# Patient Record
Sex: Female | Born: 1945 | Race: Black or African American | Hispanic: No | Smoking: Former smoker
Health system: Southern US, Community
[De-identification: ages and names within clinical notes are randomized; demographics above are authoritative.]

## PROBLEM LIST (undated history)

## (undated) DIAGNOSIS — M4800 Spinal stenosis, site unspecified: Secondary | ICD-10-CM

## (undated) DIAGNOSIS — K219 Gastro-esophageal reflux disease without esophagitis: Secondary | ICD-10-CM

## (undated) DIAGNOSIS — F419 Anxiety disorder, unspecified: Secondary | ICD-10-CM

## (undated) DIAGNOSIS — M25519 Pain in unspecified shoulder: Secondary | ICD-10-CM

## (undated) DIAGNOSIS — M1990 Unspecified osteoarthritis, unspecified site: Secondary | ICD-10-CM

## (undated) DIAGNOSIS — R609 Edema, unspecified: Secondary | ICD-10-CM

## (undated) DIAGNOSIS — M549 Dorsalgia, unspecified: Secondary | ICD-10-CM

## (undated) DIAGNOSIS — R600 Localized edema: Secondary | ICD-10-CM

## (undated) DIAGNOSIS — G4730 Sleep apnea, unspecified: Secondary | ICD-10-CM

## (undated) DIAGNOSIS — I10 Essential (primary) hypertension: Secondary | ICD-10-CM

## (undated) DIAGNOSIS — E785 Hyperlipidemia, unspecified: Secondary | ICD-10-CM

## (undated) DIAGNOSIS — M25569 Pain in unspecified knee: Secondary | ICD-10-CM

## (undated) DIAGNOSIS — G8929 Other chronic pain: Secondary | ICD-10-CM

## (undated) DIAGNOSIS — I447 Left bundle-branch block, unspecified: Secondary | ICD-10-CM

## (undated) DIAGNOSIS — R7303 Prediabetes: Secondary | ICD-10-CM

## (undated) DIAGNOSIS — E559 Vitamin D deficiency, unspecified: Secondary | ICD-10-CM

## (undated) DIAGNOSIS — Z91018 Allergy to other foods: Secondary | ICD-10-CM

## (undated) HISTORY — DX: Spinal stenosis, site unspecified: M48.00

## (undated) HISTORY — DX: Anxiety disorder, unspecified: F41.9

## (undated) HISTORY — PX: OOPHORECTOMY: SHX86

## (undated) HISTORY — PX: ABDOMINAL HYSTERECTOMY: SHX81

## (undated) HISTORY — DX: Allergy to other foods: Z91.018

## (undated) HISTORY — DX: Vitamin D deficiency, unspecified: E55.9

## (undated) HISTORY — PX: TOTAL HIP ARTHROPLASTY: SHX124

## (undated) HISTORY — DX: Left bundle-branch block, unspecified: I44.7

## (undated) HISTORY — DX: Dorsalgia, unspecified: M54.9

## (undated) HISTORY — DX: Pain in unspecified shoulder: M25.519

## (undated) HISTORY — DX: Gastro-esophageal reflux disease without esophagitis: K21.9

## (undated) HISTORY — DX: Hyperlipidemia, unspecified: E78.5

## (undated) HISTORY — PX: COLONOSCOPY: SHX174

## (undated) HISTORY — PX: APPENDECTOMY: SHX54

## (undated) HISTORY — DX: Other chronic pain: G89.29

## (undated) HISTORY — DX: Unspecified osteoarthritis, unspecified site: M19.90

## (undated) HISTORY — DX: Prediabetes: R73.03

## (undated) HISTORY — DX: Pain in unspecified knee: M25.569

## (undated) HISTORY — DX: Edema, unspecified: R60.9

## (undated) HISTORY — DX: Localized edema: R60.0

---

## 1968-10-11 HISTORY — PX: ABDOMINAL HYSTERECTOMY: SHX81

## 1968-10-11 HISTORY — PX: APPENDECTOMY: SHX54

## 1998-04-30 ENCOUNTER — Ambulatory Visit (HOSPITAL_COMMUNITY): Admission: RE | Admit: 1998-04-30 | Discharge: 1998-04-30 | Payer: Self-pay | Admitting: Internal Medicine

## 1998-08-08 ENCOUNTER — Ambulatory Visit (HOSPITAL_COMMUNITY): Admission: RE | Admit: 1998-08-08 | Discharge: 1998-08-08 | Payer: Self-pay | Admitting: Internal Medicine

## 1998-08-10 ENCOUNTER — Ambulatory Visit (HOSPITAL_COMMUNITY): Admission: RE | Admit: 1998-08-10 | Discharge: 1998-08-10 | Payer: Self-pay | Admitting: Internal Medicine

## 2000-06-06 ENCOUNTER — Encounter: Payer: Self-pay | Admitting: Emergency Medicine

## 2000-06-06 ENCOUNTER — Emergency Department (HOSPITAL_COMMUNITY): Admission: EM | Admit: 2000-06-06 | Discharge: 2000-06-07 | Payer: Self-pay | Admitting: Emergency Medicine

## 2000-10-08 ENCOUNTER — Encounter: Admission: RE | Admit: 2000-10-08 | Discharge: 2000-10-08 | Payer: Self-pay | Admitting: Internal Medicine

## 2000-10-08 ENCOUNTER — Encounter: Payer: Self-pay | Admitting: Internal Medicine

## 2001-05-04 ENCOUNTER — Encounter: Payer: Self-pay | Admitting: Internal Medicine

## 2001-05-04 ENCOUNTER — Encounter: Admission: RE | Admit: 2001-05-04 | Discharge: 2001-05-04 | Payer: Self-pay | Admitting: Internal Medicine

## 2002-10-14 ENCOUNTER — Encounter: Admission: RE | Admit: 2002-10-14 | Discharge: 2002-10-14 | Payer: Self-pay | Admitting: Internal Medicine

## 2002-10-14 ENCOUNTER — Encounter: Payer: Self-pay | Admitting: Internal Medicine

## 2002-10-28 ENCOUNTER — Observation Stay (HOSPITAL_COMMUNITY): Admission: EM | Admit: 2002-10-28 | Discharge: 2002-10-29 | Payer: Self-pay

## 2002-10-28 ENCOUNTER — Encounter: Payer: Self-pay | Admitting: *Deleted

## 2008-02-24 ENCOUNTER — Encounter: Admission: RE | Admit: 2008-02-24 | Discharge: 2008-02-24 | Payer: Self-pay | Admitting: Specialist

## 2010-06-28 NOTE — H&P (Signed)
[name], [name]                          ACCOUNT NO.:  [id number]   MEDICAL RECORD NO.:  [id number]                   PATIENT TYPE:  EMS   LOCATION:  MAJO                                 FACILITY:  [facility]   PHYSICIAN:  [name], M.D.               DATE OF BIRTH:  Dec 01, 1945   DATE OF ADMISSION:  10/28/2002  DATE OF DISCHARGE:                                HISTORY & PHYSICAL   CHIEF COMPLAINT:  Chest tightness.   HISTORY OF PRESENT ILLNESS:  This 65 year old black female has a history of  multiple cardiac risk factors.  She presents with the onset, late last p.m.  of chest tightness in the retrosternal area, nonradiating.  This was  associated with nausea, diaphoresis, dizziness, and shortness of breath and  a feeling that her heart was beating quickly.  She called 911 and the EMTs  arrived and when given nitroglycerin, oxygen, and aspirin her pain  eventually resolved.  EKG at the scene showed normal sinus rhythm and no  acute changes.  The patient did have an episode of similar chest tightness  about 2 weeks ago while going to work.  She has also had intermittent  palpitations over the last 5 months and sometimes feels like her heart is  racing.  In 1 episode her heart rate was found to be 170 on a monitor at the  dialysis center.  She had a Holter monitor done 2 weeks ago by Dr. Lovell French,  the results of which are pending.  The patient has multiple risk factors for  coronary disease including hypertension, hyperlipidemia (not taking Lipitor  as prescribed).   PAST MEDICAL HISTORY:  1. Hypertension.  2. Hyperlipidemia (Lipitor prescribed but not taking).   PAST SURGICAL HISTORY:  BTL, TAH, BSO for fibroids.   ALLERGIES:  No known drug allergies.   CURRENT MEDICATIONS:  1. Lisinopril HCTZ 20/12.5 mg q. _____.  2. Lipitor, not taking.   FAMILY HISTORY:  Early CAD and tobacco use. Mother MI age 34, CABG age 64,  died age 42 of renal failure, had diabetes  mellitus.  Father died age 44 of  stroke and hypertension.  Brother MI age 5, brother hypertension.   SOCIAL HISTORY:  Divorced. She has 3 children.  She is a Research scientist (physical sciences).  Alcohol none.  Tobacco use: 1 pack per day.   REVIEW OF SYSTEMS:  Otherwise negative.   PHYSICAL EXAMINATION:  GENERAL:  An obese, uncomfortable appearing black  female.  VITAL SIGNS:  Temperature 97.5, blood pressure 104/51, heart rate 79,  respirations 20.  Oxygen saturation is 98% on room air.  HEENT: Pupils are equal and respond to light.  Extraocular movements are  intact.  Funduscopic is limited.  Ears TMs clear.  Oropharynx is clear.  NECK:  Supple. No lymphadenopathy.  No carotid bruits.  No thyromegaly.  LUNGS:  Clear.  HEART:  Regular rate and  rhythm without murmur, gallop, or rub.  ABDOMEN:  Soft, nontender, normal bowel sounds.  No masses or  hepatosplenomegaly.  EXTREMITIES:  Showed no edema.  NEUROLOGIC: Nonfocal.   LABS:  CK/MB 1.5/1.2/1.2. Troponin I negative, less than 0.5 x3.  Other lab  work pending.  Chest x-ray pending.  EKG normal sinus rhythm and nonspecific  T wave changes.   ASSESSMENT:  1. Chest tightness, rule out unstable angina or subacute myocardial     infarction.  2. Hypertension.  3. Hyperlipidemia.  4. Tobacco use.  5. Family history of premature coronary artery disease.   PLAN:  1. Admit to telemetry.  2. Aspirin.  3. Lovenox.  4. Cardiac consult for probable stress test.  5. Continue outpatient antihypertensive.                                                [name], M.D.    [name]  D:  10/28/2002  T:  10/28/2002  Job:  [id number]   cc:   [name], M.D.  [address], [address]  [place]  [place]  [address]  Fax: [phone]

## 2010-06-28 NOTE — Discharge Summary (Signed)
NAMEALEXUS, [name]                          ACCOUNT NO.:  [id number]   MEDICAL RECORD NO.:  [id number]                   PATIENT TYPE:  INP   LOCATION:  2019                                 FACILITY:  [facility]   PHYSICIAN:  Marcene Duos, M.D.         DATE OF BIRTH:  1945/11/05   DATE OF ADMISSION:  10/28/2002  DATE OF DISCHARGE:  10/29/2002                                 DISCHARGE SUMMARY   PRIMARY CARE PHYSICIAN:  Lilla Shook, M.D.   ADMISSION DIAGNOSES:  1. Atypical chest pain.  2. Hypertension.  3. Hyperlipidemia.  4. Tobacco abuse.   DISCHARGE DIAGNOSES:  .  Atypical chest pain.  1. Hypertension.  2. Hyperlipidemia.  3. Tobacco abuse.   CONSULTATIONS:  Francisca December, M.D., cardiology.   PROCEDURES:  Stress Cardiolite 10/28/2002, normal.   HISTORY OF PRESENT ILLNESS AND HOSPITAL COURSE:  [name] is a 65 year old  female who was admitted to the emergency room on the morning of 10/28/2002,  by Dr. Kirby Funk with the complaint of chest tightness at rest the  evening before, associated with nausea, shortness of breath, dizziness,  diaphoresis, and palpitations.  The patient was given oxygen, nitroglycerin,  aspirin by EMT with relief of pain.  EKG at the scene showed no acute  changes.  The patient was subsequently brought to the emergency room.  The  patient apparently gave a history of palpitations over the last five months.  Was seen two weeks prior to admission by nurse practitioner at [facility],  and a Holter monitor was obtained.  Apparently her heart rate was noted to  be up to 170 at dialysis center recently.  She had not received Holter  monitor results.   The patient was admitted to telemetry.  Aspirin was started.  Lovenox was  initiated as well, and cardiology consult was obtained.  Hospital course is  as follows.   CHEST PAIN:  The patient remained asymptomatic throughout her  hospitalization.  Serial cardiac isoenzymes and D-dimer  were obtained.  The  D-dimer was within normal limits.  Cardiac enzymes without evidence of  injury.  She did undergo a Cardiolite stress on 10/28/2002, following  cardiology consult.  Dr. Amil Amen reviewed her images as well as the EKG  stress with no changes to suggest ischemia with either portion of the test,  and it was recommended that she could be discharged yesterday evening.   With regards to palpitations, the patient has remained in sinus bradycardia  or normal sinus rhythm since her admission per telemetry.  When asked on day  of discharge, the patient has had some heartburn that has been bothering her  in recent weeks.  She has taken Prilosec for this in the past which has  helped.  She also admits to having increased stressors in her life, both  personally and professionally, and wonders if this can be adding to this as  well.  Decision  was made to start her on Prilosec 20 mg daily at discharge  and to follow up with Dr. Lovell Sheehan in one week to discuss if additional  medications should be initiated for possible anxiety panic attacks.   The patient is discharged in stable condition on 10/29/2002.   DISCHARGE MEDICATIONS:  1. Prilosec 20 mg daily.  2. Lisinopril/hydrochlorothiazide 20/12.5 mg once daily.  3. Encouraged to take medications for lipid lowering as she has not been     doing so.  She will discuss that with Dr. Lovell Sheehan and follow up in one     week.                                                Marcene Duos, M.D.    EMM/MEDQ  D:  10/29/2002  T:  10/30/2002  Job:  [id number]   cc:   Lilla Shook, M.D.  [address], [address]  [place]  [place]  [address]  Fax: [phone]

## 2010-06-28 NOTE — Discharge Summary (Signed)
   [name], [name]                          ACCOUNT NO.:  [id number]   MEDICAL RECORD NO.:  [id number]                   PATIENT TYPE:  INP   LOCATION:  2019                                 FACILITY:  [facility]   PHYSICIAN:  Marcene Duos, M.D.         DATE OF BIRTH:  [birth year]   DATE OF ADMISSION:  10/28/2002  DATE OF DISCHARGE:  10/29/2002                                 DISCHARGE SUMMARY   ADMISSION DIAGNOSES:  1. Chest pain.  2. Palpations.  3. Hyperlipidemia.  4. Tobacco abuse.   DISCHARGE DIAGNOSES:  1. Chest pain, noncardiac.  2. Palpations.  3. Hyperlipidemia.  4. Tobacco abuse.   HISTORY OF PRESENT ILLNESS:  This is a 65 year old black female patient of  Dr. Mayra Neer who was admitted to the emergency room on the morning of  October 28, 2002 by Dr. Kirby Funk with a history of chest tightness,  center of chest, associated with nausea, shortness of breath, dizziness, and  diaphoresis, and palpitations.  The patient was given oxygen, nitroglycerin,  and aspirin by EMT with relief of pain.  EKG at the scene showed no acute  changes.   Dictation was cut off at this point.                                                    Marcene Duos, M.D.    EMM/MEDQ  D:  10/29/2002  T:  10/30/2002  Job:  [id number]   cc:   Lilla Shook, M.D.  [address]  [place]  [place]  [address]  Fax: [phone]

## 2011-03-13 ENCOUNTER — Other Ambulatory Visit (HOSPITAL_COMMUNITY): Payer: Self-pay | Admitting: Orthopaedic Surgery

## 2011-03-26 ENCOUNTER — Encounter (HOSPITAL_COMMUNITY): Payer: Self-pay | Admitting: Pharmacy Technician

## 2011-03-31 ENCOUNTER — Other Ambulatory Visit (HOSPITAL_COMMUNITY): Payer: Self-pay

## 2011-03-31 ENCOUNTER — Ambulatory Visit (HOSPITAL_COMMUNITY)
Admission: RE | Admit: 2011-03-31 | Discharge: 2011-03-31 | Disposition: A | Discharge status: Home / Self Care | Payer: Medicare Other | Source: Ambulatory Visit | Attending: Orthopaedic Surgery | Admitting: Orthopaedic Surgery

## 2011-03-31 ENCOUNTER — Encounter (HOSPITAL_COMMUNITY)
Admission: RE | Admit: 2011-03-31 | Discharge: 2011-03-31 | Disposition: A | Discharge status: Home / Self Care | Payer: Medicare Other | Source: Ambulatory Visit | Attending: Orthopaedic Surgery | Admitting: Orthopaedic Surgery

## 2011-03-31 ENCOUNTER — Encounter (HOSPITAL_COMMUNITY): Payer: Self-pay

## 2011-03-31 DIAGNOSIS — Z01812 Encounter for preprocedural laboratory examination: Secondary | ICD-10-CM | POA: Insufficient documentation

## 2011-03-31 DIAGNOSIS — Z01818 Encounter for other preprocedural examination: Secondary | ICD-10-CM | POA: Insufficient documentation

## 2011-03-31 DIAGNOSIS — M1610 Unilateral primary osteoarthritis, unspecified hip: Secondary | ICD-10-CM | POA: Insufficient documentation

## 2011-03-31 DIAGNOSIS — I10 Essential (primary) hypertension: Secondary | ICD-10-CM | POA: Insufficient documentation

## 2011-03-31 DIAGNOSIS — M169 Osteoarthritis of hip, unspecified: Secondary | ICD-10-CM | POA: Insufficient documentation

## 2011-03-31 HISTORY — DX: Unspecified osteoarthritis, unspecified site: M19.90

## 2011-03-31 HISTORY — DX: Essential (primary) hypertension: I10

## 2011-03-31 LAB — CBC
Hemoglobin: 13.8 g/dL (ref 12.0–15.0)
MCH: 31.1 pg (ref 26.0–34.0)
MCHC: 33.7 g/dL (ref 30.0–36.0)
MCV: 92.3 fL (ref 78.0–100.0)
Platelets: 347 10*3/uL (ref 150–400)
RBC: 4.44 MIL/uL (ref 3.87–5.11)

## 2011-03-31 LAB — URINALYSIS, ROUTINE W REFLEX MICROSCOPIC
Bilirubin Urine: NEGATIVE
Glucose, UA: NEGATIVE mg/dL
Ketones, ur: NEGATIVE mg/dL
Leukocytes, UA: NEGATIVE
Protein, ur: NEGATIVE mg/dL
pH: 7 (ref 5.0–8.0)

## 2011-03-31 LAB — BASIC METABOLIC PANEL
BUN: 11 mg/dL (ref 6–23)
CO2: 27 mEq/L (ref 19–32)
Calcium: 10.2 mg/dL (ref 8.4–10.5)
GFR calc non Af Amer: 56 mL/min — ABNORMAL LOW (ref >90)
Glucose, Bld: 101 mg/dL — ABNORMAL HIGH (ref 70–99)

## 2011-03-31 NOTE — Patient Instructions (Signed)
20 [name]  03/31/2011   Your procedure is scheduled on:  0730am on Friday 04/04/2011  Report to [facility] at 0530 AM.  Call this number if you have problems the morning of surgery: [phone]   Remember:   Do not eat food:After Midnight.  May have clear liquids:until Midnight .  Clear liquids include soda, tea, black coffee, apple or grape juice, broth.  Take these medicines the morning of surgery with A SIP OF WATER: Bystolic   Do not wear jewelry, make-up or nail polish.  Do not wear lotions, powders, or perfumes.   Do not shave 48 hours prior to surgery.(women only-shaving legs)  Do not bring valuables to the hospital.  Contacts, dentures or bridgework may not be worn into surgery.  Leave suitcase in the car. After surgery it may be brought to your room.  For patients admitted to the hospital, checkout time is 11:00 AM the day of discharge.   Patients discharged the day of surgery will not be allowed to drive home.  Name and phone number of your driver:   Special Instructions: CHG Shower Use Special Wash: 1/2 bottle night before surgery and 1/2 bottle morning of surgery.   Please read over the following fact sheets that you were given: MRSA Information

## 2011-03-31 NOTE — Pre-Procedure Instructions (Signed)
[name], x-ray tech with Dr. Doneen Poisson notified with abnormal BMET.

## 2011-03-31 NOTE — Pre-Procedure Instructions (Signed)
EKG 11/04/2010 by [facility] group on chart, Echo from [facility] -Dr, Verdis Prime 11/11/2010, Office note [facility] 01/15/2011 and 11/04/2010 on chart.

## 2011-04-04 ENCOUNTER — Inpatient Hospital Stay (HOSPITAL_COMMUNITY): Payer: Medicare Other | Admitting: Registered Nurse

## 2011-04-04 ENCOUNTER — Encounter (HOSPITAL_COMMUNITY): Payer: Self-pay | Admitting: *Deleted

## 2011-04-04 ENCOUNTER — Inpatient Hospital Stay (HOSPITAL_COMMUNITY): Payer: Medicare Other

## 2011-04-04 ENCOUNTER — Encounter (HOSPITAL_COMMUNITY): Payer: Self-pay | Admitting: Registered Nurse

## 2011-04-04 ENCOUNTER — Inpatient Hospital Stay (HOSPITAL_COMMUNITY)
Admission: RE | Admit: 2011-04-04 | Discharge: 2011-04-08 | DRG: 470 | Disposition: A | Discharge status: Home Health | Payer: Medicare Other | Source: Ambulatory Visit | Attending: Orthopaedic Surgery | Admitting: Orthopaedic Surgery

## 2011-04-04 ENCOUNTER — Encounter (HOSPITAL_COMMUNITY)
Admission: RE | Disposition: A | Discharge status: Home Health | Payer: Self-pay | Source: Ambulatory Visit | Attending: Orthopaedic Surgery

## 2011-04-04 DIAGNOSIS — M169 Osteoarthritis of hip, unspecified: Secondary | ICD-10-CM

## 2011-04-04 DIAGNOSIS — I10 Essential (primary) hypertension: Secondary | ICD-10-CM | POA: Diagnosis present

## 2011-04-04 DIAGNOSIS — M1610 Unilateral primary osteoarthritis, unspecified hip: Principal | ICD-10-CM | POA: Diagnosis present

## 2011-04-04 HISTORY — PX: TOTAL HIP ARTHROPLASTY: SHX124

## 2011-04-04 LAB — ABO/RH: ABO/RH(D): A POS

## 2011-04-04 LAB — TYPE AND SCREEN

## 2011-04-04 SURGERY — ARTHROPLASTY, HIP, TOTAL, ANTERIOR APPROACH
Anesthesia: Spinal | Site: Hip | Laterality: Right | Wound class: Clean

## 2011-04-04 MED ORDER — METHOCARBAMOL 500 MG PO TABS
500.0000 mg | ORAL_TABLET | Freq: Four times a day (QID) | ORAL | Status: DC | PRN
  Administered 2011-04-06: 500 mg via ORAL
  Filled 2011-04-04 (×2): qty 1

## 2011-04-04 MED ORDER — HYDROMORPHONE HCL PF 1 MG/ML IJ SOLN: 0.2500 mg | INTRAMUSCULAR | Status: DC | PRN

## 2011-04-04 MED ORDER — ACETAMINOPHEN 10 MG/ML IV SOLN
INTRAVENOUS | Status: DC | PRN
  Administered 2011-04-04: 1000 mg via INTRAVENOUS

## 2011-04-04 MED ORDER — MORPHINE SULFATE 2 MG/ML IJ SOLN: 1.0000 mg | INTRAMUSCULAR | Status: DC | PRN

## 2011-04-04 MED ORDER — FUROSEMIDE 20 MG PO TABS
20.0000 mg | ORAL_TABLET | Freq: Two times a day (BID) | ORAL | Status: DC
  Administered 2011-04-04 – 2011-04-08 (×8): 20 mg via ORAL
  Filled 2011-04-04 (×9): qty 1

## 2011-04-04 MED ORDER — PROPOFOL 10 MG/ML IV EMUL
INTRAVENOUS | Status: DC | PRN
  Administered 2011-04-04: 75 ug/kg/min via INTRAVENOUS

## 2011-04-04 MED ORDER — DOCUSATE SODIUM 100 MG PO CAPS
100.0000 mg | ORAL_CAPSULE | Freq: Two times a day (BID) | ORAL | Status: DC
  Administered 2011-04-04 – 2011-04-08 (×8): 100 mg via ORAL
  Filled 2011-04-04 (×9): qty 1

## 2011-04-04 MED ORDER — ONDANSETRON HCL 4 MG/2ML IJ SOLN: 4.0000 mg | Freq: Four times a day (QID) | INTRAMUSCULAR | Status: DC | PRN

## 2011-04-04 MED ORDER — MENTHOL 3 MG MT LOZG
1.0000 | LOZENGE | OROMUCOSAL | Status: DC | PRN
  Filled 2011-04-04: qty 9

## 2011-04-04 MED ORDER — PHENYLEPHRINE HCL 10 MG/ML IJ SOLN
INTRAMUSCULAR | Status: DC | PRN
  Administered 2011-04-04 (×5): 80 ug via INTRAVENOUS

## 2011-04-04 MED ORDER — DIPHENHYDRAMINE HCL 50 MG/ML IJ SOLN: 12.5000 mg | Freq: Four times a day (QID) | INTRAMUSCULAR | Status: DC | PRN

## 2011-04-04 MED ORDER — MIDAZOLAM HCL 5 MG/5ML IJ SOLN
INTRAMUSCULAR | Status: DC | PRN
  Administered 2011-04-04: 2 mg via INTRAVENOUS

## 2011-04-04 MED ORDER — ACETAMINOPHEN 650 MG RE SUPP: 650.0000 mg | Freq: Four times a day (QID) | RECTAL | Status: DC | PRN

## 2011-04-04 MED ORDER — PROMETHAZINE HCL 25 MG/ML IJ SOLN: 6.2500 mg | INTRAMUSCULAR | Status: DC | PRN

## 2011-04-04 MED ORDER — LACTATED RINGERS IV SOLN: INTRAVENOUS | Status: DC

## 2011-04-04 MED ORDER — ALUM & MAG HYDROXIDE-SIMETH 200-200-20 MG/5ML PO SUSP: 30.0000 mL | ORAL | Status: DC | PRN

## 2011-04-04 MED ORDER — ZOLPIDEM TARTRATE 5 MG PO TABS: 5.0000 mg | ORAL_TABLET | Freq: Every evening | ORAL | Status: DC | PRN

## 2011-04-04 MED ORDER — MORPHINE SULFATE (PF) 1 MG/ML IV SOLN
INTRAVENOUS | Status: DC
  Administered 2011-04-04: 11:00:00 via INTRAVENOUS
  Administered 2011-04-04: 2 mg via INTRAVENOUS
  Administered 2011-04-04: 1 mg via INTRAVENOUS
  Administered 2011-04-04: 23:00:00 via INTRAVENOUS
  Administered 2011-04-04: 15 mg via INTRAVENOUS
  Administered 2011-04-04: 7 mg via INTRAVENOUS
  Administered 2011-04-05: 8.91 mg via INTRAVENOUS
  Administered 2011-04-05: 2 mg via INTRAVENOUS
  Administered 2011-04-05: 8 mg via INTRAVENOUS
  Administered 2011-04-05: 2.98 mg via INTRAVENOUS
  Administered 2011-04-05: 4 mg via INTRAVENOUS
  Administered 2011-04-05: 6 mg via INTRAVENOUS
  Administered 2011-04-06: 2 mg via INTRAVENOUS
  Administered 2011-04-06: 9 mg via INTRAVENOUS
  Administered 2011-04-06: 8 mg via INTRAVENOUS
  Filled 2011-04-04 (×2): qty 25

## 2011-04-04 MED ORDER — LISINOPRIL 40 MG PO TABS
40.0000 mg | ORAL_TABLET | Freq: Every day | ORAL | Status: DC
  Administered 2011-04-06 – 2011-04-08 (×3): 40 mg via ORAL
  Filled 2011-04-04 (×4): qty 1

## 2011-04-04 MED ORDER — METOCLOPRAMIDE HCL 10 MG PO TABS: 5.0000 mg | ORAL_TABLET | Freq: Three times a day (TID) | ORAL | Status: DC | PRN

## 2011-04-04 MED ORDER — 0.9 % SODIUM CHLORIDE (POUR BTL) OPTIME
TOPICAL | Status: DC | PRN
  Administered 2011-04-04: 1000 mL

## 2011-04-04 MED ORDER — MEPERIDINE HCL 50 MG/ML IJ SOLN: 6.2500 mg | INTRAMUSCULAR | Status: DC | PRN

## 2011-04-04 MED ORDER — SODIUM CHLORIDE 0.9 % IV SOLN
INTRAVENOUS | Status: DC
  Administered 2011-04-04 (×2): via INTRAVENOUS
  Administered 2011-04-05: 1000 mL via INTRAVENOUS
  Administered 2011-04-05: 02:00:00 via INTRAVENOUS
  Administered 2011-04-06: 1000 mL via INTRAVENOUS

## 2011-04-04 MED ORDER — LACTATED RINGERS IV SOLN
INTRAVENOUS | Status: DC | PRN
  Administered 2011-04-04 (×2): via INTRAVENOUS

## 2011-04-04 MED ORDER — DIPHENHYDRAMINE HCL 12.5 MG/5ML PO ELIX: 12.5000 mg | ORAL_SOLUTION | ORAL | Status: DC | PRN

## 2011-04-04 MED ORDER — PHENOL 1.4 % MT LIQD: 1.0000 | OROMUCOSAL | Status: DC | PRN

## 2011-04-04 MED ORDER — HYDROCODONE-ACETAMINOPHEN 5-325 MG PO TABS: 1.0000 | ORAL_TABLET | ORAL | Status: DC | PRN

## 2011-04-04 MED ORDER — BUPIVACAINE HCL (PF) 0.5 % IJ SOLN
INTRAMUSCULAR | Status: DC | PRN
  Administered 2011-04-04: 3 mL

## 2011-04-04 MED ORDER — CEFAZOLIN SODIUM 1-5 GM-% IV SOLN
1.0000 g | Freq: Three times a day (TID) | INTRAVENOUS | Status: DC
  Administered 2011-04-04 – 2011-04-06 (×5): 1 g via INTRAVENOUS
  Filled 2011-04-04 (×8): qty 50

## 2011-04-04 MED ORDER — FERROUS SULFATE 325 (65 FE) MG PO TABS
325.0000 mg | ORAL_TABLET | Freq: Three times a day (TID) | ORAL | Status: DC
  Administered 2011-04-04 – 2011-04-08 (×10): 325 mg via ORAL
  Filled 2011-04-04 (×13): qty 1

## 2011-04-04 MED ORDER — METOCLOPRAMIDE HCL 5 MG/ML IJ SOLN: 5.0000 mg | Freq: Three times a day (TID) | INTRAMUSCULAR | Status: DC | PRN

## 2011-04-04 MED ORDER — EPHEDRINE SULFATE 50 MG/ML IJ SOLN
INTRAMUSCULAR | Status: DC | PRN
  Administered 2011-04-04 (×2): 5 mg via INTRAVENOUS

## 2011-04-04 MED ORDER — METHOCARBAMOL 100 MG/ML IJ SOLN
500.0000 mg | Freq: Four times a day (QID) | INTRAMUSCULAR | Status: DC | PRN
  Administered 2011-04-04 (×2): 500 mg via INTRAVENOUS
  Filled 2011-04-04 (×3): qty 5

## 2011-04-04 MED ORDER — RIVAROXABAN 10 MG PO TABS
10.0000 mg | ORAL_TABLET | Freq: Every day | ORAL | Status: DC
  Administered 2011-04-05 – 2011-04-08 (×4): 10 mg via ORAL
  Filled 2011-04-04 (×4): qty 1

## 2011-04-04 MED ORDER — ACETAMINOPHEN 325 MG PO TABS: 650.0000 mg | ORAL_TABLET | Freq: Four times a day (QID) | ORAL | Status: DC | PRN

## 2011-04-04 MED ORDER — CEFAZOLIN SODIUM-DEXTROSE 2-3 GM-% IV SOLR
2.0000 g | INTRAVENOUS | Status: AC
  Administered 2011-04-04: 2 g via INTRAVENOUS

## 2011-04-04 MED ORDER — SODIUM CHLORIDE 0.9 % IJ SOLN: 9.0000 mL | INTRAMUSCULAR | Status: DC | PRN

## 2011-04-04 MED ORDER — NALOXONE HCL 0.4 MG/ML IJ SOLN: 0.4000 mg | INTRAMUSCULAR | Status: DC | PRN

## 2011-04-04 MED ORDER — FENTANYL CITRATE 0.05 MG/ML IJ SOLN
INTRAMUSCULAR | Status: DC | PRN
  Administered 2011-04-04: 100 ug via INTRAVENOUS

## 2011-04-04 MED ORDER — ONDANSETRON HCL 4 MG PO TABS: 4.0000 mg | ORAL_TABLET | Freq: Four times a day (QID) | ORAL | Status: DC | PRN

## 2011-04-04 MED ORDER — PHENYLEPHRINE HCL 10 MG/ML IJ SOLN
10.0000 mg | INTRAVENOUS | Status: DC | PRN
  Administered 2011-04-04: 50 ug/min via INTRAVENOUS

## 2011-04-04 MED ORDER — HETASTARCH-ELECTROLYTES 6 % IV SOLN
INTRAVENOUS | Status: DC | PRN
  Administered 2011-04-04: 08:00:00 via INTRAVENOUS

## 2011-04-04 MED ORDER — DIPHENHYDRAMINE HCL 12.5 MG/5ML PO ELIX
12.5000 mg | ORAL_SOLUTION | Freq: Four times a day (QID) | ORAL | Status: DC | PRN
  Filled 2011-04-04: qty 5

## 2011-04-04 MED ORDER — NEBIVOLOL HCL 10 MG PO TABS
10.0000 mg | ORAL_TABLET | Freq: Every day | ORAL | Status: DC
  Administered 2011-04-06 – 2011-04-08 (×3): 10 mg via ORAL
  Filled 2011-04-04 (×4): qty 1

## 2011-04-04 MED ORDER — OXYCODONE HCL 5 MG PO TABS
5.0000 mg | ORAL_TABLET | ORAL | Status: DC | PRN
  Administered 2011-04-04 – 2011-04-05 (×3): 5 mg via ORAL
  Administered 2011-04-06 – 2011-04-07 (×4): 10 mg via ORAL
  Administered 2011-04-07 (×2): 5 mg via ORAL
  Administered 2011-04-07: 10 mg via ORAL
  Administered 2011-04-07: 5 mg via ORAL
  Administered 2011-04-08 (×2): 10 mg via ORAL
  Filled 2011-04-04: qty 1
  Filled 2011-04-04 (×2): qty 2
  Filled 2011-04-04 (×3): qty 1
  Filled 2011-04-04: qty 2
  Filled 2011-04-04: qty 1
  Filled 2011-04-04 (×5): qty 2

## 2011-04-04 SURGICAL SUPPLY — 36 items
BAG SPEC THK2 15X12 ZIP CLS (MISCELLANEOUS) ×1
BAG ZIPLOCK 12X15 (MISCELLANEOUS) ×3 IMPLANT
BLADE SAW SGTL 18X1.27X75 (BLADE) ×2 IMPLANT
CELLS DAT CNTRL 66122 CELL SVR (MISCELLANEOUS) ×1 IMPLANT
CLOTH BEACON ORANGE TIMEOUT ST (SAFETY) ×2 IMPLANT
DRAPE C-ARM 42X72 X-RAY (DRAPES) ×2 IMPLANT
DRAPE STERI IOBAN 125X83 (DRAPES) ×2 IMPLANT
DRAPE U-SHAPE 47X51 STRL (DRAPES) ×6 IMPLANT
DRSG MEPILEX BORDER 4X8 (GAUZE/BANDAGES/DRESSINGS) ×2 IMPLANT
DURAPREP 26ML APPLICATOR (WOUND CARE) ×2 IMPLANT
ELECT BLADE TIP CTD 4 INCH (ELECTRODE) ×2 IMPLANT
ELECT REM PT RETURN 9FT ADLT (ELECTROSURGICAL) ×2
ELECTRODE REM PT RTRN 9FT ADLT (ELECTROSURGICAL) ×1 IMPLANT
FACESHIELD LNG OPTICON STERILE (SAFETY) ×8 IMPLANT
GAUZE XEROFORM 1X8 LF (GAUZE/BANDAGES/DRESSINGS) ×2 IMPLANT
GLOVE BIO SURGEON STRL SZ7 (GLOVE) ×1 IMPLANT
GLOVE BIO SURGEON STRL SZ7.5 (GLOVE) ×2 IMPLANT
GLOVE BIOGEL PI IND STRL 7.5 (GLOVE) IMPLANT
GLOVE BIOGEL PI IND STRL 8 (GLOVE) ×1 IMPLANT
GLOVE BIOGEL PI INDICATOR 7.5 (GLOVE)
GLOVE BIOGEL PI INDICATOR 8 (GLOVE) ×1
GLOVE ECLIPSE 7.0 STRL STRAW (GLOVE) ×1 IMPLANT
GOWN STRL REIN XL XLG (GOWN DISPOSABLE) ×4 IMPLANT
KIT BASIN OR (CUSTOM PROCEDURE TRAY) ×2 IMPLANT
PACK TOTAL JOINT (CUSTOM PROCEDURE TRAY) ×2 IMPLANT
PADDING CAST COTTON 6X4 STRL (CAST SUPPLIES) ×2 IMPLANT
RETRACTOR WND ALEXIS 18 MED (MISCELLANEOUS) ×1 IMPLANT
RTRCTR WOUND ALEXIS 18CM MED (MISCELLANEOUS) ×2
STAPLER SKIN PROX WIDE 3.9 (STAPLE) ×1 IMPLANT
SUT ETHIBOND NAB CT1 #1 30IN (SUTURE) ×3 IMPLANT
SUT VIC AB 1 CT1 36 (SUTURE) ×3 IMPLANT
SUT VIC AB 2-0 CT1 27 (SUTURE) ×4
SUT VIC AB 2-0 CT1 TAPERPNT 27 (SUTURE) ×2 IMPLANT
TOWEL OR 17X26 10 PK STRL BLUE (TOWEL DISPOSABLE) ×4 IMPLANT
TOWEL OR NON WOVEN STRL DISP B (DISPOSABLE) ×2 IMPLANT
TRAY FOLEY CATH 14FRSI W/METER (CATHETERS) ×2 IMPLANT

## 2011-04-04 NOTE — Transfer of Care (Signed)
Immediate Anesthesia Transfer of Care Note  Patient: [name]  Procedure(s) Performed: Procedure(s) (LRB): TOTAL HIP ARTHROPLASTY ANTERIOR APPROACH (Right)  Patient Location: PACU  Anesthesia Type: Regional  Level of Consciousness: awake, alert , oriented, patient cooperative and responds to stimulation  Airway & Oxygen Therapy: Patient Spontanous Breathing and Patient connected to face mask oxygen  Post-op Assessment: Report given to PACU RN and Post -op Vital signs reviewed and stable  Post vital signs: stable  Complications: No apparent anesthesia complications

## 2011-04-04 NOTE — Anesthesia Procedure Notes (Addendum)
Spinal  Patient location during procedure: OR Start time: 04/04/2011 7:35 AM End time: 04/04/2011 7:45 AM Staffing Anesthesiologist: Phillips Grout Performed by: anesthesiologist  Preanesthetic Checklist Completed: patient identified, site marked, surgical consent, pre-op evaluation, timeout performed, IV checked, risks and benefits discussed and monitors and equipment checked Spinal Block Patient position: sitting Prep: Betadine Patient monitoring: heart rate, continuous pulse ox and blood pressure Approach: midline Location: L2-3 Injection technique: single-shot Needle Needle type: Spinocan  Needle gauge: 22 G Needle length: 9 cm Additional Notes Expiration date of kit checked and confirmed. Patient tolerated procedure well, without complications.

## 2011-04-04 NOTE — Progress Notes (Signed)
Utilization review completed.  

## 2011-04-04 NOTE — Anesthesia Preprocedure Evaluation (Addendum)
Anesthesia Evaluation  Patient identified by MRN, date of birth, ID band Patient awake    Reviewed: Allergy & Precautions, H&P , NPO status , Patient's Chart, lab work & pertinent test results, reviewed documented beta blocker date and time   Airway Mallampati: II TM Distance: >3 FB Neck ROM: Full    Dental No notable dental hx.    Pulmonary neg pulmonary ROS,  clear to auscultation  Pulmonary exam normal       Cardiovascular hypertension, Pt. on medications neg cardio ROS Regular Normal    Neuro/Psych Negative Neurological ROS  Negative Psych ROS   GI/Hepatic negative GI ROS, Neg liver ROS,   Endo/Other  Negative Endocrine ROS  Renal/GU negative Renal ROS  Genitourinary negative   Musculoskeletal negative musculoskeletal ROS (+)   Abdominal   Peds negative pediatric ROS (+)  Hematology negative hematology ROS (+)   Anesthesia Other Findings Upper front caps  Reproductive/Obstetrics negative OB ROS                          Anesthesia Physical Anesthesia Plan  ASA: II  Anesthesia Plan: Spinal   Post-op Pain Management:    Induction:   Airway Management Planned:   Additional Equipment:   Intra-op Plan:   Post-operative Plan:   Informed Consent: I have reviewed the patients History and Physical, chart, labs and discussed the procedure including the risks, benefits and alternatives for the proposed anesthesia with the patient or authorized representative who has indicated his/her understanding and acceptance.   Dental advisory given  Plan Discussed with: CRNA  Anesthesia Plan Comments:        Anesthesia Quick Evaluation

## 2011-04-04 NOTE — Brief Op Note (Signed)
04/04/2011  9:44 AM  PATIENT:  [name]  66 y.o. female  PRE-OPERATIVE DIAGNOSIS:  Severe end-stage arthritis right hip  POST-OPERATIVE DIAGNOSIS:  Severe end-stage arthritis right hip  PROCEDURE:  Procedure(s) (LRB): TOTAL HIP ARTHROPLASTY ANTERIOR APPROACH (Right)  SURGEON:  Surgeon(s) and Role:    * [name], MD - Primary  PHYSICIAN ASSISTANT:   ASSISTANTS: none   ANESTHESIA:   spinal  EBL:  Total I/O In: 1500 [I.V.:1000; IV Piggyback:500] Out: 700 [Urine:150; Blood:550]  BLOOD ADMINISTERED:none  DRAINS: none   LOCAL MEDICATIONS USED:  NONE  SPECIMEN:  No Specimen  DISPOSITION OF SPECIMEN:  N/A  COUNTS:  YES  TOURNIQUET:  * No tourniquets in log *  DICTATION: .Other Dictation: Dictation Number [id number]  PLAN OF CARE: Admit to inpatient   PATIENT DISPOSITION:  PACU - hemodynamically stable.   Delay start of Pharmacological VTE agent (>24hrs) due to surgical blood loss or risk of bleeding: yes

## 2011-04-04 NOTE — Preoperative (Signed)
Beta Blockers   Reason not to administer Beta Blockers:took betablocker this am at 5:00am

## 2011-04-04 NOTE — Anesthesia Postprocedure Evaluation (Signed)
  Anesthesia Post-op Note  Patient: [name]  Procedure(s) Performed: Procedure(s) (LRB): TOTAL HIP ARTHROPLASTY ANTERIOR APPROACH (Right)  Patient Location: PACU  Anesthesia Type: Spinal  Level of Consciousness: awake and alert   Airway and Oxygen Therapy: Patient Spontanous Breathing  Post-op Pain: mild  Post-op Assessment: Post-op Vital signs reviewed, Patient's Cardiovascular Status Stable, Respiratory Function Stable, Patent Airway and No signs of Nausea or vomiting  Post-op Vital Signs: stable  Complications: No apparent anesthesia complications

## 2011-04-04 NOTE — H&P (Signed)
[name] is an 66 y.o. female.   Chief Complaint:   Severe right hip pain HPI:   66 yo female with end-stage OA of right hip with x-rays showing bone-on-bone wear.  Wishes to proceed with a right total hip replacement.  Understands the risks of acute blood loss, fracture, DVT, PE.  The benefits are decreased pain and improved mobility as well as improved quality of life.  Past Medical History  Diagnosis Date  . Hypertension   . Arthritis     Past Surgical History  Procedure Date  . Appendectomy 1970's    with hysterectomy  . Abdominal hysterectomy 1970's    History reviewed. No pertinent family history. Social History:  reports that she quit smoking about 10 months ago. She does not have any smokeless tobacco history on file. She reports that she drinks alcohol. She reports that she does not use illicit drugs.  Allergies:  Allergies  Allergen Reactions  . Kiwi Extract Itching  . Pineapple Itching    Medications Prior to Admission  Medication Dose Route Frequency Provider Last Rate Last Dose  . ceFAZolin (ANCEF) IVPB 2 g/50 mL premix  2 g Intravenous 60 min Pre-Op [name], MD       No current outpatient prescriptions on file as of 04/04/2011.    Results for orders placed during the hospital encounter of 04/04/11 (from the past 48 hour(s))  ABO/RH     Status: Normal (Preliminary result)   Collection Time   04/04/11  6:00 AM      Component Value Range Comment   ABO/RH(D) A POS     TYPE AND SCREEN     Status: Normal (Preliminary result)   Collection Time   04/04/11  6:10 AM      Component Value Range Comment   ABO/RH(D) A POS      Antibody Screen PENDING      Sample Expiration 04/07/2011      No results found.  Review of Systems  All other systems reviewed and are negative.    Blood pressure 152/79, pulse 63, temperature 99.5 F (37.5 C), temperature source Oral, resp. rate 18, SpO2 96.00%. Physical Exam  Constitutional: She is oriented to  person, place, and time. She appears well-developed and well-nourished.  HENT:  Head: Normocephalic and atraumatic.  Eyes: EOM are normal. Pupils are equal, round, and reactive to light.  Neck: Normal range of motion. Neck supple.  Cardiovascular: Normal rate and regular rhythm.   Respiratory: Effort normal and breath sounds normal.  GI: Soft. Bowel sounds are normal.  Musculoskeletal:       Right hip: She exhibits decreased range of motion, decreased strength and bony tenderness.  Neurological: She is alert and oriented to person, place, and time.  Skin: Skin is warm and dry.  Psychiatric: She has a normal mood and affect.     Assessment/Plan To the OR today for a right total hip replacement and admission as an inpatient.  [name] 04/04/2011, 6:57 AM

## 2011-04-05 LAB — BASIC METABOLIC PANEL
BUN: 7 mg/dL (ref 6–23)
Chloride: 101 mEq/L (ref 96–112)
Creatinine, Ser: 0.97 mg/dL (ref 0.50–1.10)
GFR calc Af Amer: 70 mL/min — ABNORMAL LOW (ref >90)
Glucose, Bld: 111 mg/dL — ABNORMAL HIGH (ref 70–99)

## 2011-04-05 LAB — CBC
HCT: 30.3 % — ABNORMAL LOW (ref 36.0–46.0)
MCV: 92.7 fL (ref 78.0–100.0)
RDW: 13.3 % (ref 11.5–15.5)
WBC: 8.7 10*3/uL (ref 4.0–10.5)

## 2011-04-05 NOTE — Progress Notes (Signed)
Subjective: Pt ambulating in hall   Objective: Vital signs in last 24 hours: Temp:  [97.3 F (36.3 C)-99.5 F (37.5 C)] 99.5 F (37.5 C) (02/23 0600) Pulse Rate:  [50-75] 75  (02/23 0600) Resp:  [12-20] 16  (02/23 0600) BP: (103-127)/(49-70) 103/66 mmHg (02/23 0900) SpO2:  [100 %] 100 % (02/23 0851) FiO2 (%):  [2 %] 2 % (02/22 1210) Weight:  [112.946 kg (249 lb)] 112.946 kg (249 lb) (02/22 1210)  Intake/Output from previous day: 02/22 0701 - 02/23 0700 In: 4368.7 [P.O.:300; I.V.:3358.7; IV Piggyback:710] Out: 3200 [Urine:2650; Blood:550] Intake/Output this shift:    Exam:  Neurovascular intact Sensation intact distally  Labs:  Basename 04/05/11 0425  HGB 10.3*    Basename 04/05/11 0425  WBC 8.7  RBC 3.27*  HCT 30.3*  PLT 280    Basename 04/05/11 0425  NA 134*  K 3.7  CL 101  CO2 27  BUN 7  CREATININE 0.97  GLUCOSE 111*  CALCIUM 8.1*   No results found for this basename: LABPT:2,INR:2 in the last 72 hours  Assessment/Plan: Pt doing well with PT - continue to mobilize - dc next week   [name] 04/05/2011, 10:03 AM

## 2011-04-05 NOTE — Evaluation (Signed)
Physical Therapy Evaluation Patient Details Name: [name] MRN: [id number] DOB: 11-03-1945 Today's Date: 04/05/2011  Problem List:  Patient Active Problem List  Diagnoses  . Degenerative arthritis of hip    Past Medical History:  Past Medical History  Diagnosis Date  . Hypertension   . Arthritis    Past Surgical History:  Past Surgical History  Procedure Date  . Appendectomy 1970's    with hysterectomy  . Abdominal hysterectomy 1970's    PT Assessment/Plan/Recommendation PT Assessment Clinical Impression Statement: Pt with R THR presents with limited R LE strength/ROM; decreased L knee ROM and limited functional mobility.  Pt will benefit from skilled PT intervention to maximize IND for d/c home PT Recommendation/Assessment: Patient will need skilled PT in the acute care venue PT Problem List: Decreased strength;Decreased range of motion;Decreased activity tolerance;Decreased mobility;Decreased knowledge of use of DME;Pain;Obesity PT Therapy Diagnosis : Difficulty walking PT Plan PT Frequency: 7X/week PT Treatment/Interventions: DME instruction;Gait training;Stair training;Therapeutic exercise;Therapeutic activities;Functional mobility training;Patient/family education PT Recommendation Recommendations for Other Services: OT consult Follow Up Recommendations: Home health PT Equipment Recommended: Rolling walker with 5" wheels;Other (comment) (Pt will need wide RW) PT Goals  Acute Rehab PT Goals PT Goal Formulation: With patient Time For Goal Achievement: 7 days Pt will go Supine/Side to Sit: with supervision PT Goal: Supine/Side to Sit - Progress: Goal set today Pt will go Sit to Supine/Side: with supervision PT Goal: Sit to Supine/Side - Progress: Goal set today Pt will go Sit to Stand: with supervision PT Goal: Sit to Stand - Progress: Goal set today Pt will go Stand to Sit: with supervision PT Goal: Stand to Sit - Progress: Goal set today Pt will Ambulate: 51  - 150 feet;with supervision;with rolling walker PT Goal: Ambulate - Progress: Goal set today Pt will Go Up / Down Stairs: 3-5 stairs;with least restrictive assistive device PT Goal: Up/Down Stairs - Progress: Goal set today  PT Evaluation Precautions/Restrictions  Restrictions Weight Bearing Restrictions: No Other Position/Activity Restrictions: WBAT Prior Functioning  Home Living Lives With: Spouse Receives Help From: Family Type of Home: House Home Layout: One level Home Access: Stairs to enter Entrance Stairs-Rails: Can reach both Entrance Stairs-Number of Steps: 4 Prior Function Level of Independence: Independent with basic ADLs;Independent with gait;Independent with transfers Able to Take Stairs?: Yes Cognition Cognition Arousal/Alertness: Awake/alert Overall Cognitive Status: Appears within functional limits for tasks assessed Orientation Level: Oriented X4 Sensation/Coordination Coordination Gross Motor Movements are Fluid and Coordinated: Yes Extremity Assessment RUE Assessment RUE Assessment: Within Functional Limits LUE Assessment LUE Assessment: Within Functional Limits RLE Assessment RLE Assessment: Exceptions to [facility] (hip flex to 75; hip abd 15; hip strength 2/5; quads 3/5) LLE Assessment LLE Assessment: Exceptions to [facility] (knee flex ltd to 70) Mobility (including Balance) Bed Mobility Bed Mobility: Yes Supine to Sit: 3: Mod assist Supine to Sit Details (indicate cue type and reason): cues for sequence and to self assist with UEs and L LE Transfers Transfers: Yes Sit to Stand: 3: Mod assist Sit to Stand Details (indicate cue type and reason): cues for use of UEs and for LE position Stand to Sit: 3: Mod assist Stand to Sit Details: cues for use of UEs and for LE position Ambulation/Gait Ambulation/Gait: Yes Ambulation/Gait Assistance: 3: Mod assist Ambulation/Gait Assistance Details (indicate cue type and reason): cues for posture, sequence, and position  from RW Ambulation Distance (Feet): 9 Feet Assistive device: Rolling walker Gait Pattern: Step-to pattern Gait velocity: slow    Exercise  Total Joint  Exercises Ankle Circles/Pumps: AROM;10 reps;Supine Quad Sets: AROM;10 reps;Supine;Both Heel Slides: AAROM;10 reps;Supine;Right Hip ABduction/ADduction: Right;AAROM;10 reps;Supine End of Session PT - End of Session Activity Tolerance: Patient tolerated treatment well Patient left: in chair;with call bell in reach;with family/visitor present Nurse Communication: Mobility status for transfers;Mobility status for ambulation General Behavior During Session: [facility] for tasks performed Cognition: [facility] for tasks performed  [name] 04/05/2011, 1:20 PM

## 2011-04-05 NOTE — Op Note (Signed)
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  PROCEDURE:  Right total hip arthroplasty through direct anterior approach.  IMPLANTS:  DePuy Sector with Gription acetabular component size 50, size 32+4 neutral polyethylene liner, size 13 Corail femoral component with standard offset, size 32+1 ceramic hip ball.  SURGEON:  [name], M.D.  ANESTHESIA:  Spinal with sedation.  ESTIMATED BLOOD LOSS:  A 550 cc.  ANTIBIOTICS:  A 2 g IV Ancef.  COMPLICATIONS:  None.  INDICATIONS:  [name] is a 66 year old morbidly obese female with debilitating arthritis involving her right hip.  She has x-rays that show end-stage arthritis and bone-on-bone wear.  This affected her activities of daily living and she has severe pain.  She wished to proceed with a total hip arthroplasty.  The risks and benefits of this have been explained to her in detail and she does wish to proceed with surgery.  DESCRIPTION OF PROCEDURE:  After informed consent was obtained and appropriate right hip was marked, she was brought to the operating room and spinal anesthesia was obtained while she was on a stretcher.  She was then laid back down flat and a Foley catheter was placed and her feet were placed in traction boots.  She was then placed on the HANA fracture table with perineal post in place and both legs in the inline skeletal traction and the traction devices but no traction applied.  Her right hip was then prepped and draped  with DuraPrep and sterile drapes. A time-out was called and she was identified as the correct patient and correct right hip.  I then made an incision just inferior and posterior to the anterior superior iliac spine and carried this obliquely down the leg.  I dissected down to the tensor fasciae latae and then divided the tensor fasciae obliquely to expose the tensor fasciae latae muscle and I then proceeded with a direct anterior approach to the hip.  Cobra retractor was placed around the lateral neck and then up underneath the rectus femoris, a Cobra retractor was placed.  I then coagulated the lateral femoral circumflex vessels.  I opened the hip capsule and put the retractors within the hip capsule.  I then made my femoral neck cut proximal to the lesser trochanter using an oscillating saw and finished the cut with an osteotome.  A cord screw guide was placed in the femoral head and I removed the femoral head in its entirety and it was devoid of cartilage and quite hard in terms of consisting quality of the bone.  I then placed a bent Hohmann medially and a Cobra retractor laterally to gain access to the acetabulum.  Under direct visualization, I cleaned the tissue from the base of the  acetabulum and the rim.  I then began reaming from size 42 reamer all the way up to a size 50, with a 48 and 50 reamers placed under direct fluoroscopic guidance as well as visualization.  I then chose a size 50 acetabular component with Gription and knocked this into the acetabulum under direct visualization and fluoroscopy to get correct inclination and anteversion as I could. Attention was then turned to the femur after I placed a polyethylene liner and the acetabular component.  All traction was off the leg and the right leg was externally rotated to 90 degrees, with a temporary hook underneath the femur.  The leg was extended and adducted to allow access to the femoral canal.  I then released tissue  from behind the greater trochanter and the piriformis was released.  The lateral capsule was released as well to gain access to the femoral canal.  I used the box cutting guide and a rongeur to gain access to the femoral canal and I then began broaching from a size 8 broach all the way to a size 13 broach.  Once the 13 broach was placed and it was felt to be snug, I trialed a 32+1 hip ball and reduced this into the acetabulum.  Her leg lengths were near equal and it was felt to be stable with minimal shuck and good rotation and flexion.  I then removed the trial size 13 stem and femoral hip ball and placed the real size 13 stem with a real 32+1 ceramic hip ball and reduced this again into the acetabulum and it was stable.  We copiously irrigated the deep tissues with normal saline solution.  I closed the joint capsule with #1 Ethibond suture.  I then closed the tensor fasciae latae with a running #1 Vicryl followed by 2-0 Vicryl in the subcutaneous tissue and interrupted staples on the skin. Well-padded sterile dressing was applied.  She was taken off the HANA table into the recovery room in stable condition.  All final counts were correct and there were no complications noted.     [name], M.D.     CYB/MEDQ  D:  04/04/2011  T:  04/05/2011  Job:  [id number]

## 2011-04-05 NOTE — Progress Notes (Signed)
Cm spoke with pt concerning d/c planning. Pt given choice list for [facility] services. No choice made at present time. Pt request DME. [facility] notified of DME referral. Dme delivery scheduled to room prior to discharge.    Leonie Green [phone]

## 2011-04-05 NOTE — Progress Notes (Signed)
Physical Therapy Treatment Patient Details Name: ASPIN French MRN: [id number] DOB: Oct 06, 1945 Today's Date: 04/05/2011  PT Assessment/Plan  PT - Assessment/Plan Comments on Treatment Session: Marked improvement in activity tolerance vs this am PT Plan: Discharge plan remains appropriate PT Frequency: 7X/week Recommendations for Other Services: OT consult Follow Up Recommendations: Home health PT Equipment Recommended: Rolling walker with 5" wheels;Other (comment) PT Goals  Acute Rehab PT Goals Time For Goal Achievement: 7 days Pt will go Supine/Side to Sit: with supervision PT Goal: Supine/Side to Sit - Progress: Goal set today Pt will go Sit to Supine/Side: with supervision PT Goal: Sit to Supine/Side - Progress: Goal set today Pt will go Sit to Stand: with supervision PT Goal: Sit to Stand - Progress: Goal set today Pt will go Stand to Sit: with supervision PT Goal: Stand to Sit - Progress: Goal set today Pt will Ambulate: 51 - 150 feet;with supervision;with rolling walker PT Goal: Ambulate - Progress: Goal set today Pt will Go Up / Down Stairs: 3-5 stairs;with least restrictive assistive device PT Goal: Up/Down Stairs - Progress: Goal set today  PT Treatment Precautions/Restrictions  Restrictions Weight Bearing Restrictions: No Other Position/Activity Restrictions: WBAT Mobility (including Balance) Bed Mobility Sit to Supine: 4: Min assist;3: Mod assist;HOB flat Sit to Supine - Details (indicate cue type and reason): cues for sequence and use of L LE to self assist Transfers Sit to Stand: 3: Mod assist Sit to Stand Details (indicate cue type and reason): cues for use of UEs and for LE position Stand to Sit: 4: Min assist;3: Mod assist Stand to Sit Details: cues for use of UEs and for LE position Ambulation/Gait Ambulation/Gait Assistance: 4: Min assist;3: Mod assist Ambulation/Gait Assistance Details (indicate cue type and reason): cues for posture, sequence, and  position from RW Ambulation Distance (Feet): 49 Feet Assistive device: Rolling walker Gait Pattern: Step-to pattern Gait velocity: slow    Exercise    End of Session PT - End of Session Activity Tolerance: Patient tolerated treatment well Patient left: in bed;with call bell in reach;with family/visitor present Nurse Communication: Mobility status for transfers;Mobility status for ambulation General Behavior During Session: [facility] for tasks performed Cognition: [facility] for tasks performed  [name] 04/05/2011, 2:18 PM

## 2011-04-05 NOTE — Plan of Care (Signed)
Problem: Phase I Progression Outcomes Goal: Dangle evening of surgery Outcome: Not Met (add Reason) Too sleepy to dangle on side of bed

## 2011-04-06 LAB — CBC
HCT: 28 % — ABNORMAL LOW (ref 36.0–46.0)
MCH: 31.5 pg (ref 26.0–34.0)
MCHC: 33.9 g/dL (ref 30.0–36.0)
MCV: 92.7 fL (ref 78.0–100.0)
RDW: 13.3 % (ref 11.5–15.5)

## 2011-04-06 MED ORDER — SODIUM CHLORIDE 0.9 % IV SOLN: INTRAVENOUS | Status: DC

## 2011-04-06 NOTE — Progress Notes (Signed)
Physical Therapy Treatment Patient Details Name: [name] MRN: [id number] DOB: [birth year] Today's Date: 04/06/2011  PT Assessment/Plan  PT - Assessment/Plan Comments on Treatment Session: The patient is progressing well with mobility and ther ex.  Hardest part is sit to stand.  We will try to practice steps this PM.   PT Plan: Discharge plan remains appropriate;Frequency remains appropriate PT Frequency: 7X/week Follow Up Recommendations: Home health PT Equipment Recommended: Rolling walker with 5" wheels;Other (comment) (wide RW) PT Goals  Acute Rehab PT Goals PT Goal: Sit to Stand - Progress: Progressing toward goal PT Goal: Stand to Sit - Progress: Progressing toward goal PT Goal: Ambulate - Progress: Progressing toward goal  PT Treatment Precautions/Restrictions  Restrictions Weight Bearing Restrictions: No Other Position/Activity Restrictions: WBAT Mobility (including Balance) Transfers Sit to Stand: 3: Mod assist Sit to Stand Details (indicate cue type and reason): cues for safe technique, assist to support patient's trunk for balance and to stabilize the RW.   Stand to Sit: 4: Min assist Stand to Sit Details: minguard assist for safety and to help make sure descent is slow.  Verbal cues to reach back and kick right let out for comfort.   Ambulation/Gait Ambulation/Gait: Yes Ambulation/Gait Assistance: 4: Min assist Ambulation/Gait Assistance Details (indicate cue type and reason): min guard assist, cues to not pivot while turning.   Ambulation Distance (Feet): 80 Feet Assistive device: Rolling walker Gait Pattern: Step-to pattern;Antalgic    Exercise  Total Joint Exercises Ankle Circles/Pumps: AROM;Both;10 reps Quad Sets: AROM;Right;10 reps Short Arc Quad: AROM;Right;10 reps Long Arc Quad: AROM;10 reps;Left End of Session PT - End of Session Equipment Utilized During Treatment: Gait belt (RW) Activity Tolerance: Patient tolerated treatment well Patient left:  in chair;with call bell in reach;with family/visitor present (daughter) General Behavior During Session: [facility] for tasks performed Cognition: [facility] for tasks performed  Aron Inge B. [name], PT, DPT [phone] 04/06/2011, 11:40 AM

## 2011-04-06 NOTE — Progress Notes (Signed)
Physical Therapy Treatment Patient Details Name: SURIE SUCHOCKI MRN: [id number] DOB: 15-Nov-1945 Today's Date: 04/06/2011  PT Assessment/Plan  PT - Assessment/Plan Comments on Treatment Session: The patient was able to practice stairs successfully this PM.  Her walking is much smooter.  Her biggest struggle right now is getting to standing.   PT Plan: Discharge plan remains appropriate;Frequency remains appropriate PT Frequency: 7X/week Follow Up Recommendations: Home health PT Equipment Recommended: Rolling walker with 5" wheels;Other (comment) (wide RW) PT Goals  Acute Rehab PT Goals PT Goal: Sit to Supine/Side - Progress: Progressing toward goal PT Goal: Sit to Stand - Progress: Progressing toward goal Pt will go Stand to Sit: with modified independence;without upper extremity assist PT Goal: Stand to Sit - Progress: Updated due to goals met Pt will Ambulate: >150 feet;with supervision;with rolling walker PT Goal: Ambulate - Progress: Updated due to goal met Pt will Go Up / Down Stairs: with supervision;3-5 stairs;with rail(s) PT Goal: Up/Down Stairs - Progress: Updated due to goal met  PT Treatment Precautions/Restrictions  Restrictions Weight Bearing Restrictions: No Other Position/Activity Restrictions: WBAT Mobility (including Balance) Bed Mobility Sit to Supine: 4: Min assist Sit to Supine - Details (indicate cue type and reason): min assist of right leg back into the bed.   Transfers Sit to Stand: 3: Mod assist;With upper extremity assist;From chair/3-in-1;With armrests Sit to Stand Details (indicate cue type and reason): cues for safe technique, assist to support patient's trunk for balance and to stabilize the RW.   Stand to Sit: 5: Supervision;To bed;With upper extremity assist;To elevated surface Stand to Sit Details: supervision for safety, patient demonstrated safe technique Ambulation/Gait Ambulation/Gait: Yes Ambulation/Gait Assistance: 5:  Supervision Ambulation/Gait Assistance Details (indicate cue type and reason): supervision for safety. Better gait pattern this PM, more heel to toe contact, less antalgic.   Ambulation Distance (Feet): 80 Feet Assistive device: Rolling walker Gait Pattern: Step-through pattern;Antalgic Stairs: Yes Stairs Assistance: 4: Min assist Stairs Assistance Details (indicate cue type and reason): min assist to steady trunk for balance while practicing stairs for the first time.   Stair Management Technique: Two rails;Step to pattern;Forwards Number of Stairs: 4  Height of Stairs: 6     End of Session PT - End of Session Equipment Utilized During Treatment: Gait belt (RW) Activity Tolerance: Patient tolerated treatment well Patient left: in bed;with call bell in reach;with family/visitor present (husband, nefew, grandson) General Behavior During Session: [facility] for tasks performed Cognition: [facility] for tasks performed  Lurena Joiner B. Lekeya Rollings, PT, DPT [phone] 04/06/2011, 4:28 PM

## 2011-04-06 NOTE — Progress Notes (Signed)
Subjective: 2 Days Post-Op Procedure(s) (LRB): TOTAL HIP ARTHROPLASTY ANTERIOR APPROACH (Right) Patient reports pain as mild.    Objective: Vital signs in last 24 hours: Temp:  [98.3 F (36.8 C)-100.9 F (38.3 C)] 100.9 F (38.3 C) (02/24 0609) Pulse Rate:  [59-71] 71  (02/24 0609) Resp:  [16-20] 20  (02/24 0609) BP: (94-124)/(54-74) 124/74 mmHg (02/24 0609) SpO2:  [2 %-100 %] 98 % (02/24 0751)  Intake/Output from previous day: 02/23 0701 - 02/24 0700 In: 2717.9 [P.O.:720; I.V.:1847.9; IV Piggyback:150] Out: 2400 [Urine:2400] Intake/Output this shift: Total I/O In: 360 [P.O.:360] Out: 600 [Urine:600]   Basename 04/06/11 0430 04/05/11 0425  HGB 9.5* 10.3*    Basename 04/06/11 0430 04/05/11 0425  WBC 10.1 8.7  RBC 3.02* 3.27*  HCT 28.0* 30.3*  PLT 250 280    Basename 04/05/11 0425  NA 134*  K 3.7  CL 101  CO2 27  BUN 7  CREATININE 0.97  GLUCOSE 111*  CALCIUM 8.1*   No results found for this basename: LABPT:2,INR:2 in the last 72 hours  Neurovascular intact Sensation intact distally Intact pulses distally Dorsiflexion/Plantar flexion intact Incision: scant drainage  Assessment/Plan: 2 Days Post-Op Procedure(s) (LRB): TOTAL HIP ARTHROPLASTY ANTERIOR APPROACH (Right) Plan for discharge tomorrow  [name] 04/06/2011, 8:31 AM

## 2011-04-07 LAB — CBC
MCH: 31.4 pg (ref 26.0–34.0)
MCHC: 34.4 g/dL (ref 30.0–36.0)
Platelets: 264 10*3/uL (ref 150–400)
RBC: 2.99 MIL/uL — ABNORMAL LOW (ref 3.87–5.11)
RDW: 13.3 % (ref 11.5–15.5)

## 2011-04-07 NOTE — Clinical Documentation Improvement (Signed)
BMI DOCUMENTATION CLARIFICATION QUERY  THIS DOCUMENT IS NOT A PERMANENT PART OF THE MEDICAL RECORD  TO RESPOND TO THE THIS QUERY, FOLLOW THE INSTRUCTIONS BELOW:  1. If needed, update documentation for the patient's encounter via the notes activity.  2. Access this query again and click edit on the In [employer].  3. After updating, or not, click F2 to complete all highlighted (required) fields concerning your review. Select "additional documentation in the medical record" OR "no additional documentation provided".  4. Click Sign note button.  5. The deficiency will fall out of your In Basket *Please let us know if you are not able to complete this workflow by phone or e-mail (listed below).         04/07/11  Dear Dr. Magnus Ivan, [name]  In an effort to better capture your patient's severity of illness, reflect appropriate length of stay and utilization of resources, a review of the patient medical record has revealed the following indicators.    Based on your clinical judgment, please clarify and document in a progress note and/or discharge summary the clinical condition associated with the following supporting information:  In responding to this query please exercise your independent judgment.  The fact that a query is asked, does not imply that any particular answer is desired or expected.  Pt's BMI=  40.3 in setting of Svere end-stage arthritis right hip    Please clarify whether or not BMI can be linked to one of he diagnoses listed below and document in pn  and d/c. Thank You!  [facility]: When linking BMI to a diagnosis please document both BMI and diagnosis together in pn for accuracy of SOI and ROM.   Possible Clinical conditions  Morbid Obesity W/ BMI=   Underweight w/BMI=  Other condition___________________  Cannot Clinically determine _____________  Risk Factors: Sign & Symptoms: Svere end-stage arthritis right hip  HTN,  Arthritis  BMI-40.3 5'6"/249lbs   Treatment monitoring   Reviewed:  no additional documentation provided  Thank You,  Enis Slipper  RN, BSN, CCDS Clinical Documentation Specialist [facility] HIM Dept Pager: [phone] / E-mail: [email or web address]   Health Information Management [place]

## 2011-04-07 NOTE — Progress Notes (Signed)
Physical Therapy Treatment Patient Details Name: NGOC DETJEN MRN: [id number] DOB: [birth year] Today's Date: 04/07/2011  R THR Direct Anterior   WBAT POD #3 11:25 - 11:50 1 gt  1 te  PT Assessment/Plan  PT - Assessment/Plan Comments on Treatment Session: Pt staying one more day. Pt plans to D/C to home. PT Plan: Discharge plan remains appropriate Follow Up Recommendations: Home health PT PT Goals  Acute Rehab PT Goals PT Goal Formulation: With patient Pt will go Supine/Side to Sit: with supervision PT Goal: Supine/Side to Sit - Progress: Progressing toward goal Pt will go Sit to Supine/Side: with supervision PT Goal: Sit to Supine/Side - Progress: Progressing toward goal Pt will go Sit to Stand: with supervision PT Goal: Sit to Stand - Progress: Progressing toward goal Pt will go Stand to Sit: with modified independence PT Goal: Stand to Sit - Progress: Progressing toward goal Pt will Ambulate: >150 feet;with supervision;with rolling walker PT Goal: Ambulate - Progress: Progressing toward goal Pt will Go Up / Down Stairs: 3-5 stairs;with supervision;with rail(s) PT Goal: Up/Down Stairs - Progress: Progressing toward goal  PT Treatment Precautions/Restrictions  Precautions Precautions: Other (comment) Precaution Comments: none, Direct Anterior Approach Required Braces or Orthoses: No Restrictions Weight Bearing Restrictions: No Other Position/Activity Restrictions: WBAT Mobility (including Balance) Bed Mobility Bed Mobility: No (Pt OOB in recliner) Transfers Transfers: Yes Sit to Stand: With armrests;From chair/3-in-1 (MinGuard Assist) Sit to Stand Details (indicate cue type and reason): 25% VC's on proper tech and hand placement plus increased time Stand to Sit: 5: Supervision;To chair/3-in-1 Stand to Sit Details: increased time and one vc on turn completion prior to sit Ambulation/Gait Ambulation/Gait: Yes Ambulation/Gait Assistance: 5: Supervision Ambulation  Distance (Feet): 95 Feet Assistive device: Rolling walker Gait Pattern: Step-through pattern Gait velocity: Pt c/o 3/10 hip pain with activity Stairs: No Wheelchair Mobility Wheelchair Mobility: No    Exercise  Pt given handout on THR HEP and instructed on use of ICE Total Joint Exercises Ankle Circles/Pumps: AROM;Both;10 reps;Seated Long Arc Quad: AROM;Right;10 reps;Seated Knee Flexion: AROM;Right;10 reps;Standing Marching in Standing: AROM;Right;10 reps;Standing Standing Hip Extension: AROM;Right;10 reps;Standing End of Session PT - End of Session Equipment Utilized During Treatment: Gait belt Activity Tolerance: Patient tolerated treatment well Patient left: in chair;with call bell in reach;with family/visitor present General Behavior During Session: [facility] for tasks performed Cognition: [facility] for tasks performed  Felecia Shelling  PTA [facility]  Acute  Rehab Pager     [phone]

## 2011-04-07 NOTE — Clinical Documentation Improvement (Signed)
Anemia Blood Loss Clarification  THIS DOCUMENT IS NOT A PERMANENT PART OF THE MEDICAL RECORD  RESPOND TO THE THIS QUERY, FOLLOW THE INSTRUCTIONS BELOW:  1. If needed, update documentation for the patient's encounter via the notes activity.  2. Access this query again and click edit on the In [employer].  3. After updating, or not, click F2 to complete all highlighted (required) fields concerning your review. Select "additional documentation in the medical record" OR "no additional documentation provided".  4. Click Sign note button.  5. The deficiency will fall out of your In Basket *Please let us know if you are not able to complete this workflow by phone or e-mail (listed below).        04/07/11  Dear [name], C/Associates  In an effort to better capture your patient's severity of illness, reflect appropriate length of stay and utilization of resources, a review of the patient medical record has revealed the following indicators.    Based on your clinical judgment, please clarify and document in a progress note and/or discharge summary the clinical condition associated with the following supporting information:  In responding to this query please exercise your independent judgment.  The fact that a query is asked, does not imply that any particular answer is desired or expected.  According to lab pt's post op H/H=9.4/27.3 in setting of R TKA.   Please clarify based on abnormal H/H whether or not abn post op  H/H can be further specified as one of the diagnoses listed below and document in pn or d/c summary.     Possible Clinical Conditions?   " Expected Acute Blood Loss Anemia  " Acute Blood Loss Anemia  " Acute on chronic blood loss anemia  " Other Condition________________  " Cannot Clinically Determine  Risk Factors: (recent surgery, pre op anemia, EBL in OR)  Supporting Information:  Signs and Symptoms TKA, End stage arthritis  Diagnostics: Component    Latest Ref Rng 04/05/2011 04/06/2011 04/07/2011  HGB     12.0 - 15.0 g/dL 08.6 (L) 9.5 (L) 9.4 (L)  HCT     36.0 - 46.0 % 30.3 (L) 28.0 (L) 27.3 (L)   Treatments:  IV fluids: 0.9 % sodium chloride infusion   Serial H&H monitoring Medications  ferrous sulfate tablet 325    Reviewed:  no additional documentation provided  Thank You,  Enis Slipper RN, BSN, CCDS Clinical Documentation Specialist [facility] Dept Pager: [phone] / E-mail: [email or web address] Health Information Management [place]

## 2011-04-07 NOTE — Progress Notes (Signed)
Patient ID: [name], female   DOB: 1945-07-09, 66 y.o.   MRN: [id number] Doing well, but states she almost fell getting up this AM.  This did hurt her confidence a little.  He labs all look good and her incision looks good.  Plan: Up with therapy today and d/c tomorrow

## 2011-04-07 NOTE — Progress Notes (Signed)
  CARE MANAGEMENT NOTE 04/07/2011  Patient:  [name], [name]   Account Number:  [id number]  Date Initiated:  04/06/2011  Documentation initiated by:  DAVIS,TYMEEKA  Subjective/Objective Assessment:   66 yo female s/p right total hip replacement.     Action/Plan:   Home when stable   Anticipated DC Date:  04/07/2011   Anticipated DC Plan:  HOME W HOME HEALTH SERVICES  In-house referral  NA      DC Planning Services  CM consult      PAC Choice  DURABLE MEDICAL EQUIPMENT  HOME HEALTH   Choice offered to / List presented to:  C-1 Patient   DME arranged  3-N-1  Levan Hurst      DME agency  [facility]     HH arranged  HH-2 PT      [facility] agency  [facility]   Status of service:  In process, will continue to follow Medicare Important Message given?   (If response is "NO", the following Medicare IM given date fields will be blank) Date Medicare IM given:   Date Additional Medicare IM given:    Discharge Disposition:    Per UR Regulation:    Comments:  04/07/2011 Colleen Can. RN BSN CM [phone] CM followup for choice hh agency> Pt requested [facility]. [facility] notified and can provide [facility] services. List of agencies for home health plced on shadow chart

## 2011-04-07 NOTE — Evaluation (Signed)
Occupational Therapy Evaluation Patient Details Name: [name] MRN: [id number] DOB: [birth year] Today's Date: 04/07/2011  Problem List:  Patient Active Problem List  Diagnoses  . Degenerative arthritis of hip    Past Medical History:  Past Medical History  Diagnosis Date  . Hypertension   . Arthritis    Past Surgical History:  Past Surgical History  Procedure Date  . Appendectomy 1970's    with hysterectomy  . Abdominal hysterectomy 1970's    OT Assessment/Plan/Recommendation OT Assessment Clinical Impression Statement: This 66 year old female was admitted for R anterior approach THA.  She presents is overall min A for LB ADLs, min A to stand, and min guard for ambulating to bathroom.  Pt has assist at home.  She will need a 3:1 commode for home, and no further OT needs. OT Recommendation/Assessment: Patient does not need any further OT services OT Recommendation Equipment Recommended: 3 in 1 bedside comode OT Goals    OT Evaluation Precautions/Restrictions  Restrictions Weight Bearing Restrictions: No Other Position/Activity Restrictions: WBAT Prior Functioning Home Living Bathroom Shower/Tub: Tub/shower unit Bathroom Toilet: Standard   ADL ADL Grooming: Performed;Wash/dry hands;Supervision/safety Where Assessed - Grooming: Standing at sink Upper Body Bathing: Simulated;Set up Where Assessed - Upper Body Bathing: Sitting, chair;Supported Lower Body Bathing: Simulated;Minimal assistance Where Assessed - Lower Body Bathing: Sit to stand from chair Upper Body Dressing: Simulated;Set up Where Assessed - Upper Body Dressing: Sitting, chair;Supported Lower Body Dressing: Simulated;Other (comment);Minimal assistance (educated on and used sock aid) Where Assessed - Lower Body Dressing: Sit to stand from chair Toilet Transfer: Performed;Minimal assistance (min guard ambulating:  min A sit to stand) Toilet Transfer Method: Proofreader:  Raised toilet seat with arms (or 3-in-1 over toilet) Toileting - Clothing Manipulation: Simulated;Supervision/safety Where Assessed - Toileting Clothing Manipulation: Sit to stand from 3-in-1 or toilet Toileting - Hygiene: Performed;Set up Where Assessed - Toileting Hygiene: Sit on 3-in-1 or toilet Tub/Shower Transfer: Other (comment) (educated on seat/bench:  pt will sponge bathe initially) Equipment Used: Rolling walker;Sock aid Ambulation Related to ADLs: min guard walking to bathroom ADL Comments: min A sit to stand for ADLs:  will have assistance at home for ADLs:  only needs assist with socks Vision/Perception  Vision - History Baseline Vision: No visual deficits Cognition Cognition Overall Cognitive Status: Appears within functional limits for tasks assessed Orientation Level: Oriented X4 Sensation/Coordination   Extremity Assessment RUE Assessment RUE Assessment: Within Functional Limits LUE Assessment LUE Assessment: Within Functional Limits Mobility  Bed Mobility Supine to Sit: 4: Min assist Transfers Sit to Stand: 4: Min assist;From bed;From elevated surface;Without upper extremity assist Sit to Stand Details (indicate cue type and reason): 1 vc for hand placement Exercises   End of Session OT - End of Session Activity Tolerance: Patient tolerated treatment well Patient left: in chair;with call bell in reach;with family/visitor present Nurse Communication:  (iv) General Behavior During Session: [facility] for tasks performed Cognition: [facility] for tasks performed [facility], OTR/L [phone] 04/07/2011  Nysa Sarin 04/07/2011, 9:30 AM

## 2011-04-08 MED ORDER — METHOCARBAMOL 500 MG PO TABS: 500.0000 mg | ORAL_TABLET | Freq: Four times a day (QID) | ORAL | Status: AC | PRN

## 2011-04-08 MED ORDER — OXYCODONE-ACETAMINOPHEN 5-325 MG PO TABS: 1.0000 | ORAL_TABLET | ORAL | Status: AC | PRN

## 2011-04-08 MED ORDER — RIVAROXABAN 10 MG PO TABS: 10.0000 mg | ORAL_TABLET | Freq: Every day | ORAL | Status: DC

## 2011-04-08 NOTE — Progress Notes (Signed)
CARE MANAGEMENT NOTE 04/08/2011  Patient:  [name], [name]   Account Number:  [id number]  Date Initiated:  04/06/2011  Documentation initiated by:  DAVIS,TYMEEKA  Subjective/Objective Assessment:   66 yo female s/p right total hip replacement.     Action/Plan:   Home when stable   Anticipated DC Date:  04/07/2011   Anticipated DC Plan:  HOME W HOME HEALTH SERVICES  In-house referral  NA      DC Planning Services  CM consult      PAC Choice  DURABLE MEDICAL EQUIPMENT  HOME HEALTH   Choice offered to / List presented to:  C-1 Patient   DME arranged  3-N-1  Levan Hurst      DME agency  [facility]     HH arranged  HH-2 PT      [facility] agency  [facility]   Status of service:  Completed, signed off Medicare Important Message given?   (If response is "NO", the following Medicare IM given date fields will be blank) Date Medicare IM given:   Date Additional Medicare IM given:    Discharge Disposition:  HOME W HOME HEALTH SERVICES  Per UR Regulation:    Comments:  04/08/2011 Raynelle Bring bsn ccm [phone] Pt for discharge today. [facility] to [facility] services tomorrow. DME has been delivered to pt's room

## 2011-04-08 NOTE — Discharge Summary (Signed)
Patient ID: [name] MRN: [id number] DOB/AGE: [birth year] 66 y.o.  Admit date: 04/04/2011 Discharge date: 04/08/2011  Admission Diagnoses:  Principal Problem:  *Degenerative arthritis of hip   Discharge Diagnoses:  Same  Past Medical History  Diagnosis Date  . Hypertension   . Arthritis     Surgeries: Procedure(s): TOTAL HIP ARTHROPLASTY ANTERIOR APPROACH on 04/04/2011   Consultants:    Discharged Condition: Improved  Hospital Course: [name] is an 66 y.o. female who was admitted 04/04/2011 for operative treatment ofDegenerative arthritis of hip. Patient has severe unremitting pain that affects sleep, daily activities, and work/hobbies. After pre-op clearance the patient was taken to the operating room on 04/04/2011 and underwent  Procedure(s): TOTAL HIP ARTHROPLASTY ANTERIOR APPROACH.    Patient was given perioperative antibiotics: Anti-infectives     Start     Dose/Rate Route Frequency Ordered Stop   04/04/11 2000   ceFAZolin (ANCEF) IVPB 1 g/50 mL premix  Status:  Discontinued        1 g 100 mL/hr over 30 Minutes Intravenous 3 times per day 04/04/11 1932 04-07-2011 1455   04/04/11 0545   ceFAZolin (ANCEF) IVPB 2 g/50 mL premix        2 g 100 mL/hr over 30 Minutes Intravenous 60 min pre-op 04/04/11 0542 04/04/11 0735           Patient was given sequential compression devices, early ambulation, and chemoprophylaxis to prevent DVT.  Patient benefited maximally from hospital stay and there were no complications.    Recent vital signs: Patient Vitals for the past 24 hrs:  BP Temp Temp src Pulse Resp SpO2  04/08/11 0515 116/70 mmHg 98.3 F (36.8 C) Oral 69  20  96 %  04/07/11 2200 103/58 mmHg 99.6 F (37.6 C) Oral 64  18  94 %  04/07/11 1400 100/58 mmHg 98.4 F (36.9 C) Oral 65  18  93 %     Recent laboratory studies:  Basename 04/07/11 0350 2011/04/07 0430  WBC 9.0 10.1  HGB 9.4* 9.5*  HCT 27.3* 28.0*  PLT 264 250  NA -- --  K -- --  CL -- --    CO2 -- --  BUN -- --  CREATININE -- --  GLUCOSE -- --  INR -- --  CALCIUM -- --     Discharge Medications:   Medication List  As of 04/08/2011  7:05 AM   TAKE these medications         furosemide 20 MG tablet   Commonly known as: LASIX   Take 20 mg by mouth 2 (two) times daily.      HYDROcodone-acetaminophen 5-325 MG per tablet   Commonly known as: NORCO   Take 1 tablet by mouth every 6 (six) hours as needed. Pain        lisinopril 40 MG tablet   Commonly known as: PRINIVIL,ZESTRIL   Take 40 mg by mouth daily before breakfast.      methocarbamol 500 MG tablet   Commonly known as: ROBAXIN   Take 1 tablet (500 mg total) by mouth every 6 (six) hours as needed.      nebivolol 10 MG tablet   Commonly known as: BYSTOLIC   Take 10 mg by mouth daily before breakfast.      oxyCODONE-acetaminophen 5-325 MG per tablet   Commonly known as: PERCOCET   Take 1 tablet by mouth every 4 (four) hours as needed for pain.      rivaroxaban 10 MG Tabs  tablet   Commonly known as: XARELTO   Take 1 tablet (10 mg total) by mouth daily with breakfast.            Diagnostic Studies: Dg Chest 2 View  03/31/2011  *RADIOLOGY REPORT*  Clinical Data: Preop evaluation for knee surgery  CHEST - 2 VIEW  Comparison: None.  Findings: Heart size is normal.  Vascularity is normal.  Lungs are clear without infiltrate or mass.  No acute bony abnormality.  IMPRESSION: Negative  Original Report Authenticated By: Camelia Phenes, M.D.   Dg Hip Complete Right  04/04/2011  *RADIOLOGY REPORT*  Clinical Data: Right total hip.  RIGHT HIP - COMPLETE 2+ VIEW  Comparison: None  Findings: 2 intraoperative views.  Right hip arthroplasty is located, without acute complication.  IMPRESSION: Intraoperative imaging of right hip arthroplasty.  Original Report Authenticated By: Consuello Bossier, M.D.   Dg Pelvis Portable  04/04/2011  *RADIOLOGY REPORT*  Clinical Data: Status post right total hip replacement  PORTABLE PELVIS   Comparison: None.  Findings: A single view of the pelvis shows the femoral and acetabular components of the right hip replacement in good position.  No acute bony abnormality is seen.  The distal stem of the femoral component is not included on the film.  The left hip is unremarkable.  There are degenerative changes in the lower lumbar spine.  IMPRESSION: Right total hip replacement in good position with no acute abnormality as noted above.  Original Report Authenticated By: Juline Patch, M.D.   Dg Hip Portable 1 View Right  04/04/2011  *RADIOLOGY REPORT*  Clinical Data: Post hip replacement.  And  PORTABLE RIGHT HIP - 1 VIEW  Comparison: AP pelvis performed same time.  Findings: Total right hip replacement appears in satisfactory position.  No surrounding fracture noted.  IMPRESSION: Total right hip replacement appears in satisfactory position  Original Report Authenticated By: Fuller Canada, M.D.   Dg C-arm 61-120 Min-no Report  04/04/2011  CLINICAL DATA: anterior hip   C-ARM 61-120 MINUTES  Fluoroscopy was utilized by the requesting physician.  No radiographic  interpretation.      Disposition: to home       Signed: [name] 04/08/2011, 7:05 AM

## 2011-04-08 NOTE — Progress Notes (Signed)
Physical Therapy Treatment Patient Details Name: [name] MRN: [id number] DOB: 1945-09-22 Today's Date: 04/08/2011  R THR Direct Anterior Approach POD #4 9:15 : 9:50 1 gt  1 ta  PT Assessment/Plan  PT - Assessment/Plan Comments on Treatment Session: practiced up/down 4 steps using B rails.  Pt  plans to D/C to home today. PT Plan: Discharge plan remains appropriate Follow Up Recommendations: Home health PT PT Goals  Acute Rehab PT Goals PT Goal Formulation: With patient Pt will go Supine/Side to Sit: with supervision PT Goal: Supine/Side to Sit - Progress: Partly met Pt will go Sit to Supine/Side: with supervision PT Goal: Sit to Supine/Side - Progress: Partly met Pt will go Sit to Stand: with supervision PT Goal: Sit to Stand - Progress: Met Pt will go Stand to Sit: with modified independence PT Goal: Stand to Sit - Progress: Partly met Pt will Ambulate: >150 feet;with supervision;with rolling walker PT Goal: Ambulate - Progress: Partly met Pt will Go Up / Down Stairs: 3-5 stairs;with supervision;with rail(s) PT Goal: Up/Down Stairs - Progress: Partly met  PT Treatment Precautions/Restrictions  Precautions Precautions: Other (comment) Precaution Comments: none, Direct Anterior Approach Required Braces or Orthoses: No Restrictions Weight Bearing Restrictions: No Other Position/Activity Restrictions: WBAT Mobility (including Balance) Bed Mobility Bed Mobility: Yes Supine to Sit: 5: Supervision Supine to Sit Details (indicate cue type and reason): increased time Transfers Transfers: Yes Sit to Stand: 5: Supervision;From bed Sit to Stand Details (indicate cue type and reason): increased time Stand to Sit: 5: Supervision;To chair/3-in-1 Stand to Sit Details: increased time Ambulation/Gait Ambulation/Gait: Yes Ambulation/Gait Assistance: 5: Supervision Ambulation/Gait Assistance Details (indicate cue type and reason): one VC on safety with truns and backward  gait Ambulation Distance (Feet): 125 Feet Assistive device: Rolling walker Gait Pattern: Step-through pattern Gait velocity: No c/o pain, just "soreness" Stairs: Yes Stairs Assistance: Other (comment) Tree surgeon) Stairs Assistance Details (indicate cue type and reason): 25% VC's on proper sequencing and increased time Stair Management Technique: Two rails Number of Stairs: 4  Wheelchair Mobility Wheelchair Mobility: No    Exercise    End of Session PT - End of Session Equipment Utilized During Treatment: Gait belt Patient left: in chair;with call bell in reach;with family/visitor present Nurse Communication: Other (comment) (Pt ready for D/C to home) General Behavior During Session: [facility] for tasks performed Cognition: [facility] for tasks performed  Felecia Shelling  PTA [facility]  Acute  Rehab Pager     [phone]

## 2011-04-08 NOTE — Discharge Instructions (Signed)
Increase your activities as comfort allows. You can get your incision wet in the shower starting 2/27. Dry off your incision daily and keep it dry completely other than when showering. follow-up 2 weeks

## 2011-04-14 ENCOUNTER — Encounter (HOSPITAL_COMMUNITY): Payer: Self-pay | Admitting: Orthopaedic Surgery

## 2011-08-08 ENCOUNTER — Other Ambulatory Visit: Payer: Self-pay | Admitting: Internal Medicine

## 2011-08-08 DIAGNOSIS — E041 Nontoxic single thyroid nodule: Secondary | ICD-10-CM

## 2011-08-11 ENCOUNTER — Ambulatory Visit
Admission: RE | Admit: 2011-08-11 | Discharge: 2011-08-11 | Disposition: A | Discharge status: Home / Self Care | Payer: Medicare Other | Source: Ambulatory Visit | Attending: Internal Medicine | Admitting: Internal Medicine

## 2011-08-11 DIAGNOSIS — E041 Nontoxic single thyroid nodule: Secondary | ICD-10-CM

## 2013-07-06 ENCOUNTER — Other Ambulatory Visit: Payer: Self-pay | Admitting: Gastroenterology

## 2013-09-20 ENCOUNTER — Other Ambulatory Visit: Payer: Self-pay | Admitting: Gastroenterology

## 2013-09-21 ENCOUNTER — Encounter (HOSPITAL_COMMUNITY): Payer: Self-pay | Admitting: Pharmacy Technician

## 2013-09-23 ENCOUNTER — Encounter (HOSPITAL_COMMUNITY): Payer: Self-pay | Admitting: *Deleted

## 2013-09-25 ENCOUNTER — Encounter: Payer: Self-pay | Admitting: *Deleted

## 2013-10-04 ENCOUNTER — Encounter (HOSPITAL_COMMUNITY): Payer: Medicare Other | Admitting: Anesthesiology

## 2013-10-04 ENCOUNTER — Encounter (HOSPITAL_COMMUNITY)
Admission: RE | Disposition: A | Discharge status: Home / Self Care | Payer: Self-pay | Source: Ambulatory Visit | Attending: Gastroenterology

## 2013-10-04 ENCOUNTER — Encounter (HOSPITAL_COMMUNITY): Payer: Self-pay | Admitting: *Deleted

## 2013-10-04 ENCOUNTER — Ambulatory Visit (HOSPITAL_COMMUNITY)
Admission: RE | Admit: 2013-10-04 | Discharge: 2013-10-04 | Disposition: A | Discharge status: Home / Self Care | Payer: Medicare Other | Source: Ambulatory Visit | Attending: Gastroenterology | Admitting: Gastroenterology

## 2013-10-04 ENCOUNTER — Ambulatory Visit: Admit: 2013-10-04 | Payer: Self-pay | Admitting: Gastroenterology

## 2013-10-04 ENCOUNTER — Ambulatory Visit (HOSPITAL_COMMUNITY): Payer: Medicare Other | Admitting: Anesthesiology

## 2013-10-04 DIAGNOSIS — Z87891 Personal history of nicotine dependence: Secondary | ICD-10-CM | POA: Diagnosis not present

## 2013-10-04 DIAGNOSIS — I10 Essential (primary) hypertension: Secondary | ICD-10-CM | POA: Diagnosis not present

## 2013-10-04 DIAGNOSIS — M4800 Spinal stenosis, site unspecified: Secondary | ICD-10-CM | POA: Insufficient documentation

## 2013-10-04 DIAGNOSIS — Z860100 Personal history of colon polyps, unspecified: Secondary | ICD-10-CM | POA: Insufficient documentation

## 2013-10-04 DIAGNOSIS — K5730 Diverticulosis of large intestine without perforation or abscess without bleeding: Secondary | ICD-10-CM | POA: Insufficient documentation

## 2013-10-04 DIAGNOSIS — Z96649 Presence of unspecified artificial hip joint: Secondary | ICD-10-CM | POA: Insufficient documentation

## 2013-10-04 DIAGNOSIS — Z1211 Encounter for screening for malignant neoplasm of colon: Secondary | ICD-10-CM | POA: Diagnosis present

## 2013-10-04 DIAGNOSIS — E785 Hyperlipidemia, unspecified: Secondary | ICD-10-CM | POA: Diagnosis not present

## 2013-10-04 HISTORY — DX: Sleep apnea, unspecified: G47.30

## 2013-10-04 HISTORY — PX: COLONOSCOPY WITH PROPOFOL: SHX5780

## 2013-10-04 SURGERY — COLONOSCOPY WITH PROPOFOL
Anesthesia: General

## 2013-10-04 SURGERY — COLONOSCOPY
Anesthesia: General

## 2013-10-04 MED ORDER — ONDANSETRON HCL 4 MG/2ML IJ SOLN
INTRAMUSCULAR | Status: AC
  Filled 2013-10-04: qty 2

## 2013-10-04 MED ORDER — ONDANSETRON HCL 4 MG/2ML IJ SOLN
INTRAMUSCULAR | Status: DC | PRN
  Administered 2013-10-04: 4 mg via INTRAVENOUS

## 2013-10-04 MED ORDER — LIDOCAINE HCL (CARDIAC) 20 MG/ML IV SOLN
INTRAVENOUS | Status: DC | PRN
  Administered 2013-10-04: 100 mg via INTRAVENOUS

## 2013-10-04 MED ORDER — SODIUM CHLORIDE 0.9 % IV SOLN: INTRAVENOUS | Status: DC

## 2013-10-04 MED ORDER — PROPOFOL 10 MG/ML IV BOLUS
INTRAVENOUS | Status: AC
  Filled 2013-10-04: qty 20

## 2013-10-04 MED ORDER — LIDOCAINE HCL (CARDIAC) 20 MG/ML IV SOLN
INTRAVENOUS | Status: AC
  Filled 2013-10-04: qty 5

## 2013-10-04 MED ORDER — PROPOFOL INFUSION 10 MG/ML OPTIME
INTRAVENOUS | Status: DC | PRN
  Administered 2013-10-04: 200 ug/kg/min via INTRAVENOUS

## 2013-10-04 MED ORDER — KETAMINE HCL 10 MG/ML IJ SOLN
INTRAMUSCULAR | Status: DC | PRN
  Administered 2013-10-04: 20 mg via INTRAVENOUS

## 2013-10-04 MED ORDER — LACTATED RINGERS IV SOLN
INTRAVENOUS | Status: DC
  Administered 2013-10-04: 09:00:00 via INTRAVENOUS

## 2013-10-04 SURGICAL SUPPLY — 22 items

## 2013-10-04 NOTE — Op Note (Signed)
Procedure: Surveillance colonoscopy. Personal history of colonic polyps removed colonoscopically in the past  Endoscopist: Earle Gell  Premedication: Propofol administered by anesthesia  Procedure: The patient was placed in the left lateral decubitus position. Anal inspection and digital rectal exam were normal. The Pentax pediatric colonoscope was introduced into the rectum and advanced to the cecum. A normal-appearing appendiceal orifice was identified. A normal-appearing ileocecal valve was identified. Colonic preparation for the exam today was good. Performance of the colonoscopy was technically very difficult due to colonic loop formation and pelvic adhesions from previous surgery.  Rectum. Normal. Retroflex view of the distal rectum normal  Sigmoid colon and descending colon. Left colonic diverticulosis  Splenic flexure. Normal  Transverse colon. Normal  Hepatic flexure. Normal  Ascending colon. Normal  Cecum and ileocecal valve. Normal  Assessment: Normal surveillance colonoscopy. Left colonic diverticulosis  Recommendation: Schedule repeat surveillance colonoscopy in 5 years

## 2013-10-04 NOTE — Anesthesia Preprocedure Evaluation (Addendum)
Anesthesia Evaluation  Patient identified by MRN, date of birth, ID band Patient awake    Reviewed: Allergy & Precautions, H&P , NPO status , Patient's Chart, lab work & pertinent test results, reviewed documented beta blocker date and time   Airway Mallampati: II TM Distance: >3 FB Neck ROM: full    Dental  (+) Caps, Dental Advisory Given, Missing Upper front are capped. Right side missing:   Pulmonary neg pulmonary ROS, former smoker,  breath sounds clear to auscultation  Pulmonary exam normal       Cardiovascular Exercise Tolerance: Good hypertension, Pt. on medications and Pt. on home beta blockers Rhythm:regular Rate:Normal     Neuro/Psych negative neurological ROS  negative psych ROS   GI/Hepatic negative GI ROS, Neg liver ROS,   Endo/Other  negative endocrine ROS  Renal/GU negative Renal ROS  negative genitourinary   Musculoskeletal   Abdominal   Peds  Hematology negative hematology ROS (+)   Anesthesia Other Findings   Reproductive/Obstetrics negative OB ROS                          Anesthesia Physical Anesthesia Plan  ASA: II  Anesthesia Plan: General   Post-op Pain Management:    Induction:   Airway Management Planned:   Additional Equipment:   Intra-op Plan:   Post-operative Plan:   Informed Consent: I have reviewed the patients History and Physical, chart, labs and discussed the procedure including the risks, benefits and alternatives for the proposed anesthesia with the patient or authorized representative who has indicated his/her understanding and acceptance.   Dental Advisory Given  Plan Discussed with: CRNA  Anesthesia Plan Comments:         Anesthesia Quick Evaluation

## 2013-10-04 NOTE — H&P (Signed)
  Procedure: Surveillance colonoscopy. Colon polyps removed colonoscopically in the past in [place], [place]. Negative family history for colon cancer.  History: The patient is a 68 year old female born 03-Dec-1945. The patient tells me she has undergone colonoscopic exams in the past with removal of colon polyps.  Colonoscopy reports and: Polyp pathology were not available for review.  The patient is scheduled to undergo a surveillance colonoscopy.  Medication allergies: None  Family history: Negative for colon cancer  Past medical history: Right hip replacement surgery in 2013. Hysterectomy. Oophorectomy. Hypertension. Hypercholesterolemia. Spinal stenosis.  Exam: The patient is alert and lying comfortably on the endoscopy stretcher. Abdomen is soft and nontender to palpation. Lungs are clear to auscultation. Cardiac exam reveals a regular rhythm.  Plan: Proceed with surveillance colonoscopy

## 2013-10-04 NOTE — Anesthesia Postprocedure Evaluation (Signed)
  Anesthesia Post-op Note  Patient: [name]  Procedure(s) Performed: Procedure(s) (LRB): COLONOSCOPY WITH PROPOFOL (N/A)  Patient Location: PACU  Anesthesia Type: MAC  Level of Consciousness: awake and alert   Airway and Oxygen Therapy: Patient Spontanous Breathing  Post-op Pain: mild  Post-op Assessment: Post-op Vital signs reviewed, Patient's Cardiovascular Status Stable, Respiratory Function Stable, Patent Airway and No signs of Nausea or vomiting  Last Vitals:  Filed Vitals:   10/04/13 1004  BP: 165/65  Pulse: 65  Temp: 36.7 C  Resp: 12    Post-op Vital Signs: stable   Complications: No apparent anesthesia complications

## 2013-10-04 NOTE — Transfer of Care (Signed)
Immediate Anesthesia Transfer of Care Note  Patient: [name]  Procedure(s) Performed: Procedure(s) (LRB): COLONOSCOPY WITH PROPOFOL (N/A)  Patient Location: PACU  Anesthesia Type: MAC  Level of Consciousness: sedated, patient cooperative and responds to stimulation  Airway & Oxygen Therapy: Patient Spontanous Breathing and Patient connected to face mask oxgen  Post-op Assessment: Report given to PACU RN and Post -op Vital signs reviewed and stable  Post vital signs: Reviewed and stable  Complications: No apparent anesthesia complications

## 2013-10-04 NOTE — Discharge Instructions (Signed)
Colonoscopy, Care After °These instructions give you information on caring for yourself after your procedure. Your doctor may also give you more specific instructions. Call your doctor if you have any problems or questions after your procedure. °HOME CARE °· Do not drive for 24 hours. °· Do not sign important papers or use machinery for 24 hours. °· You may shower. °· You may go back to your usual activities, but go slower for the first 24 hours. °· Take rest breaks often during the first 24 hours. °· Walk around or use warm packs on your belly (abdomen) if you have belly cramping or gas. °· Drink enough fluids to keep your pee (urine) clear or pale yellow. °· Resume your normal diet. Avoid heavy or fried foods. °· Avoid drinking alcohol for 24 hours or as told by your doctor. °· Only take medicines as told by your doctor. °If a tissue sample (biopsy) was taken during the procedure:  °· Do not take aspirin or blood thinners for 7 days, or as told by your doctor. °· Do not drink alcohol for 7 days, or as told by your doctor. °· Eat soft foods for the first 24 hours. °GET HELP IF: °You still have a small amount of blood in your poop (stool) 2-3 days after the procedure. °GET HELP RIGHT AWAY IF: °· You have more than a small amount of blood in your poop. °· You see clumps of tissue (blood clots) in your poop. °· Your belly is puffy (swollen). °· You feel sick to your stomach (nauseous) or throw up (vomit). °· You have a fever. °· You have belly pain that gets worse and medicine does not help. °MAKE SURE YOU: °· Understand these instructions. °· Will watch your condition. °· Will get help right away if you are not doing well or get worse. °Document Released: 03/01/2010 Document Revised: 02/01/2013 Document Reviewed: 10/04/2012 °[employer]® Patient Information ©2015 [employer]. This information is not intended to replace advice given to you by your health care provider. Make sure you discuss any questions you have with  your health care provider. ° °

## 2013-10-05 ENCOUNTER — Encounter (HOSPITAL_COMMUNITY): Payer: Self-pay | Admitting: Gastroenterology

## 2014-06-17 ENCOUNTER — Encounter (HOSPITAL_COMMUNITY): Payer: Self-pay | Admitting: *Deleted

## 2014-06-17 ENCOUNTER — Observation Stay (HOSPITAL_COMMUNITY)
Admission: EM | Admit: 2014-06-17 | Discharge: 2014-06-18 | Disposition: A | Discharge status: Home / Self Care | Payer: Medicare Other | Attending: Cardiology | Admitting: Cardiology

## 2014-06-17 ENCOUNTER — Emergency Department (HOSPITAL_COMMUNITY): Payer: Medicare Other

## 2014-06-17 DIAGNOSIS — R000 Tachycardia, unspecified: Secondary | ICD-10-CM | POA: Insufficient documentation

## 2014-06-17 DIAGNOSIS — M25472 Effusion, left ankle: Secondary | ICD-10-CM | POA: Insufficient documentation

## 2014-06-17 DIAGNOSIS — Z79899 Other long term (current) drug therapy: Secondary | ICD-10-CM | POA: Diagnosis not present

## 2014-06-17 DIAGNOSIS — E785 Hyperlipidemia, unspecified: Secondary | ICD-10-CM | POA: Insufficient documentation

## 2014-06-17 DIAGNOSIS — M25471 Effusion, right ankle: Secondary | ICD-10-CM | POA: Insufficient documentation

## 2014-06-17 DIAGNOSIS — Z87891 Personal history of nicotine dependence: Secondary | ICD-10-CM | POA: Insufficient documentation

## 2014-06-17 DIAGNOSIS — R079 Chest pain, unspecified: Principal | ICD-10-CM | POA: Insufficient documentation

## 2014-06-17 DIAGNOSIS — R002 Palpitations: Secondary | ICD-10-CM | POA: Diagnosis not present

## 2014-06-17 DIAGNOSIS — R0789 Other chest pain: Secondary | ICD-10-CM

## 2014-06-17 DIAGNOSIS — G4730 Sleep apnea, unspecified: Secondary | ICD-10-CM | POA: Insufficient documentation

## 2014-06-17 DIAGNOSIS — R609 Edema, unspecified: Secondary | ICD-10-CM | POA: Diagnosis not present

## 2014-06-17 DIAGNOSIS — M1990 Unspecified osteoarthritis, unspecified site: Secondary | ICD-10-CM | POA: Insufficient documentation

## 2014-06-17 DIAGNOSIS — R0602 Shortness of breath: Secondary | ICD-10-CM | POA: Insufficient documentation

## 2014-06-17 DIAGNOSIS — I10 Essential (primary) hypertension: Secondary | ICD-10-CM | POA: Insufficient documentation

## 2014-06-17 DIAGNOSIS — Z7951 Long term (current) use of inhaled steroids: Secondary | ICD-10-CM | POA: Insufficient documentation

## 2014-06-17 LAB — CBC WITH DIFFERENTIAL/PLATELET
BASOS ABS: 0.1 10*3/uL (ref 0.0–0.1)
BASOS PCT: 1 % (ref 0–1)
Eosinophils Absolute: 0.2 10*3/uL (ref 0.0–0.7)
Eosinophils Relative: 2 % (ref 0–5)
HEMATOCRIT: 42.1 % (ref 36.0–46.0)
Hemoglobin: 14.6 g/dL (ref 12.0–15.0)
Lymphocytes Relative: 37 % (ref 12–46)
Lymphs Abs: 2.9 10*3/uL (ref 0.7–4.0)
MCH: 31.7 pg (ref 26.0–34.0)
MCHC: 34.7 g/dL (ref 30.0–36.0)
MCV: 91.5 fL (ref 78.0–100.0)
Monocytes Absolute: 0.6 10*3/uL (ref 0.1–1.0)
Monocytes Relative: 7 % (ref 3–12)
NEUTROS ABS: 4.2 10*3/uL (ref 1.7–7.7)
Neutrophils Relative %: 53 % (ref 43–77)
PLATELETS: 349 10*3/uL (ref 150–400)
RBC: 4.6 MIL/uL (ref 3.87–5.11)
RDW: 13.7 % (ref 11.5–15.5)
WBC: 7.9 10*3/uL (ref 4.0–10.5)

## 2014-06-17 LAB — BASIC METABOLIC PANEL
Anion gap: 8 (ref 5–15)
BUN: 8 mg/dL (ref 6–20)
CALCIUM: 9.6 mg/dL (ref 8.9–10.3)
CHLORIDE: 105 mmol/L (ref 101–111)
CO2: 21 mmol/L — AB (ref 22–32)
CREATININE: 1.1 mg/dL — AB (ref 0.44–1.00)
GFR calc Af Amer: 59 mL/min — ABNORMAL LOW (ref >60)
GFR, EST NON AFRICAN AMERICAN: 51 mL/min — AB (ref >60)
GLUCOSE: 156 mg/dL — AB (ref 70–99)
POTASSIUM: 3.4 mmol/L — AB (ref 3.5–5.1)
SODIUM: 134 mmol/L — AB (ref 135–145)

## 2014-06-17 LAB — BRAIN NATRIURETIC PEPTIDE: B NATRIURETIC PEPTIDE 5: 16.7 pg/mL (ref 0.0–100.0)

## 2014-06-17 LAB — I-STAT TROPONIN, ED: TROPONIN I, POC: 0 ng/mL (ref 0.00–0.08)

## 2014-06-17 LAB — TROPONIN I: Troponin I: <0.03 ng/mL (ref <0.031)

## 2014-06-17 MED ORDER — NITROGLYCERIN 0.4 MG SL SUBL
0.4000 mg | SUBLINGUAL_TABLET | SUBLINGUAL | Status: DC | PRN
Start: 2014-06-17 — End: 2014-06-17
  Administered 2014-06-17 (×2): 0.4 mg via SUBLINGUAL

## 2014-06-17 MED ORDER — ENOXAPARIN SODIUM 60 MG/0.6ML ~~LOC~~ SOLN
60.0000 mg | SUBCUTANEOUS | Status: DC
  Administered 2014-06-18: 60 mg via SUBCUTANEOUS
  Filled 2014-06-17: qty 0.6

## 2014-06-17 MED ORDER — NEBIVOLOL HCL 5 MG PO TABS
10.0000 mg | ORAL_TABLET | Freq: Every morning | ORAL | Status: DC
  Administered 2014-06-18: 10 mg via ORAL
  Filled 2014-06-17 (×2): qty 2

## 2014-06-17 MED ORDER — NITROGLYCERIN 0.4 MG SL SUBL
SUBLINGUAL_TABLET | SUBLINGUAL | Status: AC
  Administered 2014-06-17: 0.4 mg via SUBLINGUAL
  Filled 2014-06-17: qty 1

## 2014-06-17 MED ORDER — ENOXAPARIN SODIUM 40 MG/0.4ML ~~LOC~~ SOLN: 40.0000 mg | SUBCUTANEOUS | Status: DC

## 2014-06-17 MED ORDER — METOPROLOL TARTRATE 1 MG/ML IV SOLN
5.0000 mg | Freq: Once | INTRAVENOUS | Status: AC
  Administered 2014-06-17: 5 mg via INTRAVENOUS
  Filled 2014-06-17: qty 5

## 2014-06-17 MED ORDER — ACETAMINOPHEN 325 MG PO TABS: 650.0000 mg | ORAL_TABLET | ORAL | Status: DC | PRN

## 2014-06-17 MED ORDER — ASPIRIN EC 81 MG PO TBEC
81.0000 mg | DELAYED_RELEASE_TABLET | Freq: Every day | ORAL | Status: DC
  Administered 2014-06-18: 81 mg via ORAL
  Filled 2014-06-17: qty 1

## 2014-06-17 MED ORDER — LISINOPRIL 20 MG PO TABS
20.0000 mg | ORAL_TABLET | Freq: Every morning | ORAL | Status: DC
  Administered 2014-06-18: 20 mg via ORAL
  Filled 2014-06-17: qty 2

## 2014-06-17 MED ORDER — ASPIRIN 81 MG PO CHEW
324.0000 mg | CHEWABLE_TABLET | Freq: Once | ORAL | Status: AC
  Administered 2014-06-17: 324 mg via ORAL

## 2014-06-17 MED ORDER — ONDANSETRON HCL 4 MG/2ML IJ SOLN: 4.0000 mg | Freq: Four times a day (QID) | INTRAMUSCULAR | Status: DC | PRN

## 2014-06-17 MED ORDER — ASPIRIN 81 MG PO CHEW
CHEWABLE_TABLET | ORAL | Status: AC
  Administered 2014-06-17: 324 mg via ORAL
  Filled 2014-06-17: qty 4

## 2014-06-17 MED ORDER — AMLODIPINE BESYLATE 5 MG PO TABS
5.0000 mg | ORAL_TABLET | Freq: Every morning | ORAL | Status: DC
  Administered 2014-06-18: 5 mg via ORAL
  Filled 2014-06-17: qty 1

## 2014-06-17 NOTE — H&P (Signed)
PCP:   Shamleffer, Lucienne Capers, MD  Primary cardiologist: none Chief Complaint: Chest pain   HPI: 69 year old African-American female with past medical history of hypertension, hyperlipidemia, family history of early cardiovascular disease, former smoker, obese who presented with chest pain. Patient ran out of her bysystolic. She reported that she is currently in the Medicare donut hole in her co-pay for by systolic was about $762 that is why she was not able to afford it. Also note she is not aware about $4 medication programs such as at [employer]. Patient started having palpitations since she ran out of her bysystolic. On the day of admission patient was carrying heavy backs when she started having palpitations again associate with chest pain that was moderate in intensity, pressure-like, substernal, associated with mild shortness of breath. Pain lasted for about 2 hours and subsided after patient presented to the emergency department received IV metoprolol, sublingual nitroglycerin 2 and full dose aspirin. Patient was chest pain-free at the time of my evaluation.  Patient denied active symptoms of chest pain, shortness of breath, PND orthopnea, frequent or prolonged palpitations, nausea vomiting, dysuria. Patient reported long-standing history of lower extremity edema which however appears to be getting worse over the last 5 months. She also admitted snoring however was never tested for obstructive sleep apnea.  Review of Systems:  12 systems were reviewed and were found to be negative except mentioned in history of present illness  Past Medical History: Past Medical History  Diagnosis Date  . Hypertension   . Arthritis   . Hyperlipidemia   . Spinal stenosis   . Sleep apnea     possibly   Past Surgical History  Procedure Laterality Date  . Abdominal hysterectomy    . Appendectomy      with hysterectomy  . Colonoscopy    . Oophorectomy    . Total hip arthroplasty    . Colonoscopy  with propofol N/A 10/04/2013    Procedure: COLONOSCOPY WITH PROPOFOL;  Surgeon: Garlan Fair, MD;  Location: [facility];  Service: Endoscopy;  Laterality: N/A;    Medications: Prior to Admission medications   Medication Sig Start Date End Date Taking? Authorizing Provider  amLODipine (NORVASC) 5 MG tablet Take 5 mg by mouth every morning.   Yes Historical Provider, MD  aspirin EC 81 MG tablet Take 81 mg by mouth daily.   Yes Historical Provider, MD  furosemide (LASIX) 20 MG tablet Take 20 mg by mouth 2 (two) times daily.   Yes Historical Provider, MD  lisinopril (PRINIVIL,ZESTRIL) 20 MG tablet Take 20 mg by mouth every morning.   Yes Historical Provider, MD  nebivolol (BYSTOLIC) 10 MG tablet Take 10 mg by mouth every morning.   Yes Historical Provider, MD    Allergies:  No Known Allergies  Social History:  reports that she quit smoking about 3 years ago. She has never used smokeless tobacco. She reports that she does not drink alcohol or use illicit drugs.  Family History: Family History  Problem Relation Age of Onset  . Hypertension Mother   . Heart disease Mother   . Hypertension Father   . Heart disease Father   . Heart attack Father   . Prostate cancer Brother   . Heart attack Brother   . Heart disease Brother     PHYSICAL EXAM:  Filed Vitals:   06/17/14 2145 06/17/14 2200 06/17/14 2215 06/17/14 2324  BP: 129/63 138/59 113/56 166/80  Pulse: 74 72 73 74  Temp:  98.6 F (37 C)  TempSrc:    Oral  Resp: 18 17 16 18   Height:    5\' 6"  (1.676 m)  Weight:    118.525 kg (261 lb 4.8 oz)  SpO2: 98% 99% 100% 99%   General:  Well appearing. No respiratory difficulty HEENT: normal Neck: supple. JVD at 12 cm. Carotids 2+ bilat; no bruits. No lymphadenopathy or thryomegaly appreciated. Cor: PMI nondisplaced. Regular rate & rhythm. No rubs, gallops or murmurs. Lungs: clear Abdomen: soft, nontender, nondistended. No hepatosplenomegaly. No bruits or masses. Good bowel  sounds. Extremities: no cyanosis, clubbing, rash, 1+ pitting edema Neuro: alert & oriented x 3, cranial nerves grossly intact. moves all 4 extremities w/o difficulty. Affect pleasant.  Labs on Admission:   Recent Labs  06/17/14 1945  NA 134*  K 3.4*  CL 105  CO2 21*  GLUCOSE 156*  BUN 8  CREATININE 1.10*  CALCIUM 9.6   No results for input(s): AST, ALT, ALKPHOS, BILITOT, PROT, ALBUMIN in the last 72 hours. No results for input(s): LIPASE, AMYLASE in the last 72 hours.  Recent Labs  06/17/14 1945  WBC 7.9  NEUTROABS 4.2  HGB 14.6  HCT 42.1  MCV 91.5  PLT 349    Recent Labs  06/17/14 2256  TROPONINI <0.03   No results for input(s): TSH, T4TOTAL, T3FREE, THYROIDAB in the last 72 hours.  Invalid input(s): FREET3 No results for input(s): VITAMINB12, FOLATE, FERRITIN, TIBC, IRON, RETICCTPCT in the last 72 hours.  Radiological Exams on Admission (all images were personally reviewed and interpreted by me, radiology reports were reviewed as well): Dg Chest 2 View  06/17/2014   CLINICAL DATA:  Chest pain  EXAM: CHEST  2 VIEW  COMPARISON:  None.  FINDINGS: The heart size and mediastinal contours are within normal limits. Both lungs are clear. The visualized skeletal structures are unremarkable.  IMPRESSION: No active cardiopulmonary disease.   Electronically Signed   By: Inez Catalina M.D.   On: 06/17/2014 21:05   EKG personally reviewed and interpreted by me: sinus tach, LBBB  Assessment; Present on Admission:  . Chest pain - HTN  Patient presented with palpitations/sinus tachycardia and chest pain. Her palpitations is likely related to withdrawal of her chronic beta blocker. Chest pain may be secondary to demand ischemia in somebody with LVH as evidenced by left bundle branch block, long-standing hypertension and mild LVH on the bedside echocardiogram, however given patient's risk factors for coronary artery disease it is reasonable proved to proceed with ischemic  evaluation.  Plan: - Troponins 3 - TSH, lipid profile - Restart by systolic as well as other antihypertensives - Echocardiogram - Myocardial SPECT stress test a.m. - Continue aspirin 81 mg daily - Will benefit from outpatient sleep study     [place], [place] 06/17/2014, 11:59 PM

## 2014-06-17 NOTE — ED Provider Notes (Addendum)
CSN: [id number]     Arrival date & time 06/17/14  1925 History   First MD Initiated Contact with Patient 06/17/14 1946     Chief Complaint  Patient presents with  . Chest Pain     (Consider location/radiation/quality/duration/timing/severity/associated sxs/prior Treatment) HPI Comments: Patient states for the last 2 weeks she's had intermittent palpitations after running out of her by systolic but today while she was carrying a heavy bag into the house she develop palpitations and chest discomfort in the center of her chest with only minimal shortness of breath. The chest discomfort has been a 4 out of 10 and unchanged. She denies any nausea, diaphoresis. She denies ever having symptoms like this before. She has never had an EKG or cardiac evaluation.  Patient is a 69 y.o. female presenting with palpitations. The history is provided by the patient.  Palpitations Palpitations quality:  Fast Onset quality:  Insidious Duration:  2 weeks Timing:  Intermittent Progression:  Worsening Chronicity:  New Context comment:  Started after running out of her bystolic Relieved by:  None tried Exacerbated by: exertion. Ineffective treatments:  None tried Associated symptoms: chest pain, chest pressure, lower extremity edema and shortness of breath   Associated symptoms: no back pain, no cough, no diaphoresis, no dizziness, no leg pain, no nausea, no orthopnea and no weakness   Risk factors: no diabetes mellitus, no heart disease, no hx of atrial fibrillation, no hx of thyroid disease and no hypercoagulable state   Risk factors comment:  Family hx of parents in the 23's with MI and brother at 84 with MI   Past Medical History  Diagnosis Date  . Hypertension   . Arthritis   . Hyperlipidemia   . Spinal stenosis   . Sleep apnea     possibly   Past Surgical History  Procedure Laterality Date  . Abdominal hysterectomy    . Appendectomy      with hysterectomy  . Colonoscopy    . Oophorectomy     . Total hip arthroplasty    . Colonoscopy with propofol N/A 10/04/2013    Procedure: COLONOSCOPY WITH PROPOFOL;  Surgeon: Garlan Fair, MD;  Location: [facility];  Service: Endoscopy;  Laterality: N/A;   Family History  Problem Relation Age of Onset  . Hypertension Mother   . Heart disease Mother   . Hypertension Father   . Heart disease Father   . Heart attack Father   . Prostate cancer Brother   . Heart attack Brother   . Heart disease Brother    History  Substance Use Topics  . Smoking status: Former Smoker    Quit date: 05/23/2011  . Smokeless tobacco: Never Used  . Alcohol Use: No   OB History    No data available     Review of Systems  Constitutional: Negative for diaphoresis.  Respiratory: Positive for shortness of breath. Negative for cough.   Cardiovascular: Positive for chest pain and palpitations. Negative for orthopnea.       Chronic swelling in the ankles and feet but also little better than baseline  Gastrointestinal: Negative for nausea.  Musculoskeletal: Negative for back pain.  Neurological: Negative for dizziness and weakness.  All other systems reviewed and are negative.     Allergies  Review of patient's allergies indicates no known allergies.  Home Medications   Prior to Admission medications   Medication Sig Start Date End Date Taking? Authorizing Provider  acetaminophen (TYLENOL) 325 MG tablet Take 650 mg by  mouth every 6 (six) hours as needed (Pain).    Historical Provider, MD  amLODipine (NORVASC) 5 MG tablet Take 5 mg by mouth every morning.    Historical Provider, MD  buPROPion (WELLBUTRIN SR) 150 MG 12 hr tablet Take 300 mg by mouth daily.    Historical Provider, MD  fluticasone (FLOVENT DISKUS) 50 MCG/BLIST diskus inhaler Inhale 2 puffs into the lungs daily.    Historical Provider, MD  furosemide (LASIX) 20 MG tablet Take 20 mg by mouth 2 (two) times daily.    Historical Provider, MD  lisinopril (PRINIVIL,ZESTRIL) 20 MG tablet  Take 20 mg by mouth every morning.    Historical Provider, MD  nebivolol (BYSTOLIC) 10 MG tablet Take 10 mg by mouth every morning.    Historical Provider, MD  simvastatin (ZOCOR) 20 MG tablet Take 20 mg by mouth daily.    Historical Provider, MD  traMADol (ULTRAM) 50 MG tablet Take by mouth as needed.    Historical Provider, MD   BP 177/90 mmHg  Pulse 129  Temp(Src) 98.3 F (36.8 C) (Oral)  Resp 20  Ht 5\' 6"  (1.676 m)  Wt 263 lb 1.6 oz (119.341 kg)  BMI 42.49 kg/m2  SpO2 95% Physical Exam  Constitutional: She is oriented to person, place, and time. She appears well-developed and well-nourished. No distress.  HENT:  Head: Normocephalic and atraumatic.  Mouth/Throat: Oropharynx is clear and moist.  Eyes: Conjunctivae and EOM are normal. Pupils are equal, round, and reactive to light.  Neck: Normal range of motion. Neck supple.  Cardiovascular: Regular rhythm and intact distal pulses.  Tachycardia present.   No murmur heard. Pulmonary/Chest: Effort normal and breath sounds normal. No respiratory distress. She has no wheezes. She has no rales. She exhibits no tenderness.  Abdominal: Soft. She exhibits no distension. There is no tenderness. There is no rebound and no guarding.  Musculoskeletal: Normal range of motion. She exhibits edema. She exhibits no tenderness.  1+ pitting edema of the ankles  Neurological: She is alert and oriented to person, place, and time.  Skin: Skin is warm and dry. No rash noted. No erythema.  Psychiatric: She has a normal mood and affect. Her behavior is normal.  Nursing note and vitals reviewed.   ED Course  Procedures (including critical care time) Labs Review Labs Reviewed  CBC WITH DIFFERENTIAL/PLATELET  BASIC METABOLIC PANEL  BRAIN NATRIURETIC PEPTIDE  I-STAT [place], ED    Imaging Review No results found.   EKG Interpretation   Date/Time:  Saturday Jun 17 2014 19:31:30 EDT Ventricular Rate:  123 PR Interval:  134 QRS Duration:  116 QT Interval:  326 QTC Calculation: 466 R Axis:   -14 Text Interpretation:  Sinus tachycardia with occasional Premature  ventricular complexes Right atrial enlargement Anterior infarct , age  undetermined Marked ST abnormality, possible lateral subendocardial injury  Left bundle branch block No previous tracing Reconfirmed by Maryan Rued  MD,  Loree Fee ([id number]) on 06/17/2014 7:57:06 PM      MDM   Final diagnoses:  Chest pain  Tachycardia    Patient with a history of hypertension, hyperlipidemia presents today with help of dictations and central chest discomfort for the last 2-3 hours after carrying a heavy bag into her house. She states she's had palpitations intermittently for the last few weeks and she ran out of by systolic and has not been taking it.  However this is the first time she's had chest discomfort. She has a significant family history of both parents in  their 54s and her brother who is in his 78s with heart attack.  She denies any infectious symptoms, recent unilateral leg swelling, pleuritic pain and no prior history of clotting disorder.  She has never been evaluated by a cardiologist or had an EKG done in the past.  Today's EKG shows a left bundle branch block without old to compare. She still has 4 out of 10 chest pain and tachycardia improved to the low 100s.  Patient given aspirin and nitroglycerin. Feel the tachycardia and palpitations could be a result of rebound tachycardia from stopping her beta blocker.  Will discuss with the code STEMI cardiologist. Patient's EKG was seen and screened by another physician.  8:18 PM Discussed with Dr. Ellyn Hack who at this time does not feel pt is a code STEMI.  Will treat rebound tachycardia with lopressor.  Initial trop neg.  Will have cardiology fellow come and evaluate.  8:45 PM Pt pain free after 2 NTG.  Lopressor given and improvement of heart rate to 70's.  BP improved to 133/78  Blanchie Dessert, MD 06/17/14 2046  Blanchie Dessert, MD 06/17/14 2252

## 2014-06-17 NOTE — ED Notes (Signed)
3W APPT TIME AT 2320

## 2014-06-17 NOTE — ED Notes (Signed)
Have not taken blood pressure medication because she ran out 2 weeks ago.

## 2014-06-17 NOTE — ED Notes (Signed)
Second EKG completed given to EDP at bedside.

## 2014-06-17 NOTE — ED Notes (Signed)
EKG completed in triage.

## 2014-06-17 NOTE — ED Notes (Signed)
Patient presents with c/o midsternal CP.  States it feels like a fluttering sensation in her chest with SOB

## 2014-06-18 ENCOUNTER — Observation Stay (HOSPITAL_COMMUNITY): Payer: Medicare Other

## 2014-06-18 ENCOUNTER — Encounter (HOSPITAL_COMMUNITY): Payer: Medicare Other

## 2014-06-18 ENCOUNTER — Encounter (HOSPITAL_COMMUNITY): Payer: Medicare Other | Attending: Physician Assistant

## 2014-06-18 DIAGNOSIS — I10 Essential (primary) hypertension: Secondary | ICD-10-CM | POA: Diagnosis not present

## 2014-06-18 DIAGNOSIS — M1990 Unspecified osteoarthritis, unspecified site: Secondary | ICD-10-CM | POA: Diagnosis not present

## 2014-06-18 DIAGNOSIS — R079 Chest pain, unspecified: Secondary | ICD-10-CM | POA: Insufficient documentation

## 2014-06-18 DIAGNOSIS — E785 Hyperlipidemia, unspecified: Secondary | ICD-10-CM | POA: Diagnosis not present

## 2014-06-18 DIAGNOSIS — R0789 Other chest pain: Secondary | ICD-10-CM | POA: Insufficient documentation

## 2014-06-18 LAB — TROPONIN I
Troponin I: <0.03 ng/mL (ref <0.031)
Troponin I: <0.03 ng/mL (ref <0.031)
Troponin I: <0.03 ng/mL (ref <0.031)

## 2014-06-18 LAB — LIPID PANEL
CHOL/HDL RATIO: 5.5 ratio
Cholesterol: 186 mg/dL (ref 0–200)
HDL: 34 mg/dL — AB (ref >40)
LDL Cholesterol: 131 mg/dL — ABNORMAL HIGH (ref 0–99)
Triglycerides: 105 mg/dL (ref <150)
VLDL: 21 mg/dL (ref 0–40)

## 2014-06-18 LAB — TSH: TSH: 1.587 u[IU]/mL (ref 0.350–4.500)

## 2014-06-18 MED ORDER — REGADENOSON 0.4 MG/5ML IV SOLN
0.4000 mg | Freq: Once | INTRAVENOUS | Status: AC
  Administered 2014-06-18: 0.4 mg via INTRAVENOUS

## 2014-06-18 MED ORDER — FUROSEMIDE 20 MG PO TABS: 20.0000 mg | ORAL_TABLET | Freq: Two times a day (BID) | ORAL | Status: DC

## 2014-06-18 MED ORDER — ALUM & MAG HYDROXIDE-SIMETH 200-200-20 MG/5ML PO SUSP
30.0000 mL | ORAL | Status: DC | PRN
  Administered 2014-06-18: 30 mL via ORAL
  Filled 2014-06-18: qty 30

## 2014-06-18 MED ORDER — TECHNETIUM TC 99M SESTAMIBI - CARDIOLITE
30.0000 | Freq: Once | INTRAVENOUS | Status: AC | PRN
  Administered 2014-06-18: 30 via INTRAVENOUS

## 2014-06-18 MED ORDER — TECHNETIUM TC 99M SESTAMIBI GENERIC - CARDIOLITE
10.0000 | Freq: Once | INTRAVENOUS | Status: AC | PRN
  Administered 2014-06-18: 10 via INTRAVENOUS

## 2014-06-18 MED ORDER — POTASSIUM CHLORIDE CRYS ER 20 MEQ PO TBCR
40.0000 meq | EXTENDED_RELEASE_TABLET | Freq: Once | ORAL | Status: AC
  Administered 2014-06-18: 40 meq via ORAL
  Filled 2014-06-18: qty 2

## 2014-06-18 MED ORDER — REGADENOSON 0.4 MG/5ML IV SOLN
INTRAVENOUS | Status: AC
  Filled 2014-06-18: qty 5

## 2014-06-18 MED ORDER — CARVEDILOL 12.5 MG PO TABS: 12.5000 mg | ORAL_TABLET | Freq: Two times a day (BID) | ORAL | Status: AC

## 2014-06-18 NOTE — Progress Notes (Signed)
Pt c/o 7/10 chest pain radiating to back, worse with inspiration, and reproducible with manual pressure. Pt states feels different from chest pain that brought her to ED. EKG obtained, showing sinus bradycardia and left bundle branch block.  PRN maalox given because patient stated she felt like she needed to belch and could not. Pt has belched numerous times and now states pain 1/10. MD notified. No new orders. VSS. Will continue to monitor closely.

## 2014-06-18 NOTE — Progress Notes (Signed)
Lexiscan MV performed, 1 day study, [facility] to read.  Jonetta Speak , PA-C 8:24 AM 06/18/2014

## 2014-06-18 NOTE — Discharge Summary (Signed)
CARDIOLOGY DISCHARGE SUMMARY   Patient ID: [name] MRN: [id number] DOB/AGE: July 28, 1945 69 y.o.  Admit date: 06/17/2014 Discharge date: 06/18/2014  PCP: Ileana Roup, MD Primary Cardiologist: Dr Wynonia Lawman  Primary Discharge Diagnosis:  Chest pain, low risk of cardiac etiology  Secondary Discharge Diagnosis:  Essential hypertension Hyperlipidemia Hypokalemia  Procedures: Lexi scan Myoview, two-view chest x-ray  Hospital Course: [name] is a 69 y.o. female with no history of CAD. She has a history of hypertension, hyperlipidemia and family history of premature coronary artery disease. She had chest pain and palpitations. She came to the hospital and was admitted for further evaluation and treatment.  Her cardiac enzymes were negative for MI. A lipid profile is below. She has elevated LDL but has not been compliant with a low cholesterol, heart-healthy diet. She is encouraged to do diet modifications and follow-up with primary care. She was noted to be mildly hypokalemic and was supplemented. She is not felt to need oral supplementation as an outpatient, and is encouraged to eat potassium rich foods occasionally.  Since her symptoms had resolved, ECG was not acute and cardiac enzymes were negative, she had a Lexi scan Myoview on 06/18/2014. Results are below. There was a possible defect that was also possibly breast attenuation and there was no ischemia. Her EF is normal and it is overall a low-risk study.  On 06/18/2014, she was seen by Dr. Wynonia Lawman and all data were reviewed. No further inpatient workup is indicated and she is considered stable for discharge, to follow up as an outpatient with primary care and with Dr. Wynonia Lawman when necessary.  Labs:   Lab Results  Component Value Date   WBC 7.9 06/17/2014   HGB 14.6 06/17/2014   HCT 42.1 06/17/2014   MCV 91.5 06/17/2014   PLT 349 06/17/2014     Recent Labs Lab 06/17/14 1945  NA 134*  K 3.4*  CL 105  CO2 21*   BUN 8  CREATININE 1.10*  CALCIUM 9.6  GLUCOSE 156*    Recent Labs  06/18/14 0115 06/18/14 0357 06/18/14 0533  TROPONINI <0.03 <0.03 <0.03   Lipid Panel     Component Value Date/Time   CHOL 186 06/18/2014 0533   TRIG 105 06/18/2014 0533   HDL 34* 06/18/2014 0533   CHOLHDL 5.5 06/18/2014 0533   VLDL 21 06/18/2014 0533   LDLCALC 131* 06/18/2014 0533    B NATRIURETIC PEPTIDE  Date/Time Value Ref Range Status  06/17/2014 07:45 PM 16.7 0.0 - 100.0 pg/mL Final     Radiology: Dg Chest 2 View 06/17/2014   CLINICAL DATA:  Chest pain  EXAM: CHEST  2 VIEW  COMPARISON:  None.  FINDINGS: The heart size and mediastinal contours are within normal limits. Both lungs are clear. The visualized skeletal structures are unremarkable.  IMPRESSION: No active cardiopulmonary disease.   Electronically Signed   By: Inez Catalina M.D.   On: 06/17/2014 21:05   Nm Myocar Multi W/spect W/wall Motion / Ef 06/18/2014   CLINICAL DATA:  Chest pain, hypertension, shortness of breath, history of rapid heart rate per patient  EXAM: MYOCARDIAL IMAGING WITH SPECT (REST AND PHARMACOLOGIC-STRESS)  GATED LEFT VENTRICULAR WALL MOTION STUDY  LEFT VENTRICULAR EJECTION FRACTION  TECHNIQUE: Standard myocardial SPECT imaging was performed after resting intravenous injection of 10 mCi Tc-24m sestamibi. Subsequently, intravenous infusion of Lexiscan was performed under the supervision of the Cardiology staff. At peak effect of the drug, 30 mCi Tc-27m sestamibi was injected intravenously and standard  myocardial SPECT imaging was performed. Quantitative gated imaging was also performed to evaluate left ventricular wall motion, and estimate left ventricular ejection fraction.  COMPARISON:  None  FINDINGS: Perfusion: Small focus of non reversible decreased myocardial perfusion at the anteroseptal portion of LEFT ventricular apex, potentially related to breast attenuation. No additional myocardial perfusion defects.  Wall Motion: Normal left  ventricular wall motion. No left ventricular dilation.  Left Ventricular Ejection Fraction: 65 %  End diastolic volume 68 ml  End systolic volume 24 ml  IMPRESSION: 1. Small non reversible area of decreased myocardial perfusion at the anteroseptal portion of LEFT ventricular apex, potentially representing a breast attenuation artifact. No other perfusion defects identified.  2. Normal left ventricular wall motion.  3. Left ventricular ejection fraction 65%  4. Low-risk stress test findings*.  *2012 Appropriate Use Criteria for Coronary Revascularization Focused Update: J Am Coll Cardiol. 6754;49(2):010-071. http://content.airportbarriers.com.aspx?articleid=1201161   Electronically Signed   By: Lavonia Dana M.D.   On: 06/18/2014 12:36   EKG: Sinus rhythm, left bundle branch block  FOLLOW UP PLANS AND APPOINTMENTS No Known Allergies   Medication List    STOP taking these medications        nebivolol 10 MG tablet  Commonly known as:  BYSTOLIC      TAKE these medications        amLODipine 5 MG tablet  Commonly known as:  NORVASC  Take 5 mg by mouth every morning.     aspirin EC 81 MG tablet  Take 81 mg by mouth daily.     carvedilol 12.5 MG tablet  Commonly known as:  COREG  Take 1 tablet (12.5 mg total) by mouth 2 (two) times daily with a meal.     furosemide 20 MG tablet  Commonly known as:  LASIX  Take 20 mg by mouth 2 (two) times daily.     lisinopril 20 MG tablet  Commonly known as:  PRINIVIL,ZESTRIL  Take 20 mg by mouth every morning.        Discharge Instructions    Diet - low sodium heart healthy    Complete by:  As directed      Increase activity slowly    Complete by:  As directed           Follow-up Information    Follow up with Kerry Hough, MD.   Specialty:  Cardiology   Why:  As needed   Contact information:   [address] [place] [address] [phone]       Schedule an appointment as soon as possible for a visit with  Shamleffer, Lucienne Capers, MD.   Specialty:  Internal Medicine   Contact information:   [address]  [address] [place] [address] [phone]       [facility] [place]  Time spent with patient to include physician time: 44 min Signed: Rosaria Ferries, PA-C 06/18/2014, 3:57 PM Co-Sign MD

## 2014-06-18 NOTE — Progress Notes (Signed)
UR completed 

## 2014-06-18 NOTE — Progress Notes (Signed)
Subjective:  No recurrence of pain like she did earlier.  Had atypical pain associated with palpation earlier this morning.    Objective:  Vital Signs in the last 24 hours: BP 113/62 mmHg  Pulse 61  Temp(Src) 98.8 F (37.1 C) (Oral)  Resp 18  Ht 5\' 6"  (1.676 m)  Wt 118.525 kg (261 lb 4.8 oz)  BMI 42.20 kg/m2  SpO2 97%  Physical Exam: Pleasant obese black female currently in no acute distress Lungs:  Clear Cardiac:  Regular rhythm, normal S1 and S2, no S3 Abdomen:  Soft, nontender, no masses Extremities:  No edema present, no deformities  Intake/Output from previous day: 05/07 0701 - 05/08 0700 In: 240 [P.O.:240] Out: -   Weight Filed Weights   06/17/14 1937 06/17/14 2324 06/18/14 0400  Weight: 119.341 kg (263 lb 1.6 oz) 118.525 kg (261 lb 4.8 oz) 118.525 kg (261 lb 4.8 oz)    Lab Results: Basic Metabolic Panel:  Recent Labs  06/17/14 1945  NA 134*  K 3.4*  CL 105  CO2 21*  GLUCOSE 156*  BUN 8  CREATININE 1.10*   CBC:  Recent Labs  06/17/14 1945  WBC 7.9  NEUTROABS 4.2  HGB 14.6  HCT 42.1  MCV 91.5  PLT 349   Cardiac Enzymes: Troponin (Point of Care Test)  Recent Labs  06/17/14 1953  TROPIPOC 0.00   Cardiac Panel (last 3 results)  Recent Labs  06/18/14 0115 06/18/14 0357 06/18/14 0533  TROPONINI <0.03 <0.03 <0.03    Telemetry: Sinus rhythm  Assessment/Plan:  1.  Chest pain with some atypical features in a patient with left bundle branch block and negative enzymes 2.  Medical noncompliance 3.  Hypertensive heart disease 4.  Left bundle branch block  Recommendations:  For Myoview later today.      Kerry Hough  MD [facility]  06/18/2014, 8:35 AM

## 2014-09-22 ENCOUNTER — Other Ambulatory Visit: Payer: Self-pay | Admitting: Physician Assistant

## 2014-09-26 ENCOUNTER — Other Ambulatory Visit: Payer: Self-pay | Admitting: Physician Assistant

## 2015-02-23 ENCOUNTER — Emergency Department (HOSPITAL_COMMUNITY)
Admission: EM | Admit: 2015-02-23 | Discharge: 2015-02-23 | Disposition: A | Discharge status: Home / Self Care | Payer: Medicare Other | Attending: Emergency Medicine | Admitting: Emergency Medicine

## 2015-02-23 ENCOUNTER — Encounter (HOSPITAL_COMMUNITY): Payer: Self-pay | Admitting: Emergency Medicine

## 2015-02-23 DIAGNOSIS — R42 Dizziness and giddiness: Secondary | ICD-10-CM | POA: Diagnosis not present

## 2015-02-23 DIAGNOSIS — Z87891 Personal history of nicotine dependence: Secondary | ICD-10-CM | POA: Diagnosis not present

## 2015-02-23 DIAGNOSIS — Z79899 Other long term (current) drug therapy: Secondary | ICD-10-CM | POA: Diagnosis not present

## 2015-02-23 DIAGNOSIS — Z8639 Personal history of other endocrine, nutritional and metabolic disease: Secondary | ICD-10-CM | POA: Diagnosis not present

## 2015-02-23 DIAGNOSIS — M1990 Unspecified osteoarthritis, unspecified site: Secondary | ICD-10-CM | POA: Insufficient documentation

## 2015-02-23 DIAGNOSIS — Z7982 Long term (current) use of aspirin: Secondary | ICD-10-CM | POA: Diagnosis not present

## 2015-02-23 DIAGNOSIS — Z8669 Personal history of other diseases of the nervous system and sense organs: Secondary | ICD-10-CM | POA: Insufficient documentation

## 2015-02-23 DIAGNOSIS — I10 Essential (primary) hypertension: Secondary | ICD-10-CM | POA: Diagnosis not present

## 2015-02-23 LAB — MAGNESIUM: MAGNESIUM: 2.1 mg/dL (ref 1.7–2.4)

## 2015-02-23 LAB — BASIC METABOLIC PANEL
ANION GAP: 9 (ref 5–15)
BUN: 6 mg/dL (ref 6–20)
CHLORIDE: 105 mmol/L (ref 101–111)
CO2: 25 mmol/L (ref 22–32)
Calcium: 9.5 mg/dL (ref 8.9–10.3)
Creatinine, Ser: 0.95 mg/dL (ref 0.44–1.00)
GFR calc non Af Amer: >60 mL/min (ref >60)
Glucose, Bld: 119 mg/dL — ABNORMAL HIGH (ref 65–99)
POTASSIUM: 4.1 mmol/L (ref 3.5–5.1)
SODIUM: 139 mmol/L (ref 135–145)

## 2015-02-23 LAB — CBC WITH DIFFERENTIAL/PLATELET
BASOS PCT: 2 %
Basophils Absolute: 0.1 10*3/uL (ref 0.0–0.1)
EOS ABS: 0.2 10*3/uL (ref 0.0–0.7)
Eosinophils Relative: 3 %
HEMATOCRIT: 40.4 % (ref 36.0–46.0)
HEMOGLOBIN: 13.6 g/dL (ref 12.0–15.0)
Lymphocytes Relative: 40 %
Lymphs Abs: 1.9 10*3/uL (ref 0.7–4.0)
MCH: 31.1 pg (ref 26.0–34.0)
MCHC: 33.7 g/dL (ref 30.0–36.0)
MCV: 92.2 fL (ref 78.0–100.0)
MONOS PCT: 9 %
Monocytes Absolute: 0.4 10*3/uL (ref 0.1–1.0)
NEUTROS ABS: 2.1 10*3/uL (ref 1.7–7.7)
NEUTROS PCT: 46 %
Platelets: 321 10*3/uL (ref 150–400)
RBC: 4.38 MIL/uL (ref 3.87–5.11)
RDW: 13.3 % (ref 11.5–15.5)
WBC: 4.7 10*3/uL (ref 4.0–10.5)

## 2015-02-23 LAB — I-STAT TROPONIN, ED: TROPONIN I, POC: 0 ng/mL (ref 0.00–0.08)

## 2015-02-23 MED ORDER — MECLIZINE HCL 25 MG PO TABS
50.0000 mg | ORAL_TABLET | Freq: Once | ORAL | Status: AC
  Administered 2015-02-23: 50 mg via ORAL
  Filled 2015-02-23: qty 2

## 2015-02-23 MED ORDER — MECLIZINE HCL 50 MG PO TABS: 50.0000 mg | ORAL_TABLET | Freq: Three times a day (TID) | ORAL | Status: DC | PRN

## 2015-02-23 MED ORDER — PROMETHAZINE HCL 25 MG/ML IJ SOLN
12.5000 mg | Freq: Once | INTRAMUSCULAR | Status: DC
  Filled 2015-02-23: qty 1

## 2015-02-23 NOTE — ED Notes (Signed)
Pt ambulated to the restroom with quick steady steps.  Notified Dr Oleta Mouse.

## 2015-02-23 NOTE — ED Provider Notes (Signed)
CSN: [id number]     Arrival date & time 02/23/15  1343 History   First MD Initiated Contact with Patient 02/23/15 1349     Chief Complaint  Patient presents with  . Near Syncope     (Consider location/radiation/quality/duration/timing/severity/associated sxs/prior Treatment) HPI 70 year old female with history of HTN and HLD who presents with dizziness. States sudden onset of dizziness at around noon today while talking on the phone with her daughter, does not feel room is spinning but states it is worse when she is turning her head left > right, when she gets up from sitting and when she is walking. States mild gait instability with this. States she doesn't feel lightheaded, but feels that when dizziness is worse she could potentially pass out. No syncope, chest pain, sob, edema. No confusion, vision or speech changes, numbness or weakness. Recently got over flu like illness over weekend.   Past Medical History  Diagnosis Date  . Hypertension   . Arthritis   . Hyperlipidemia   . Spinal stenosis   . Sleep apnea     possibly   Past Surgical History  Procedure Laterality Date  . Abdominal hysterectomy    . Appendectomy      with hysterectomy  . Colonoscopy    . Oophorectomy    . Total hip arthroplasty    . Colonoscopy with propofol N/A 10/04/2013    Procedure: COLONOSCOPY WITH PROPOFOL;  Surgeon: Garlan Fair, MD;  Location: [facility];  Service: Endoscopy;  Laterality: N/A;   Family History  Problem Relation Age of Onset  . Hypertension Mother   . Heart disease Mother   . Hypertension Father   . Heart disease Father   . Heart attack Father   . Prostate cancer Brother   . Heart attack Brother   . Heart disease Brother    Social History  Substance Use Topics  . Smoking status: Former Smoker    Quit date: 05/23/2011  . Smokeless tobacco: Never Used  . Alcohol Use: No   OB History    No data available     Review of Systems 10/14 systems reviewed and are  negative other than those stated in the HPI    Allergies  Review of patient's allergies indicates no known allergies.  Home Medications   Prior to Admission medications   Medication Sig Start Date End Date Taking? Authorizing Provider  amLODipine (NORVASC) 5 MG tablet Take 5 mg by mouth every morning.    Historical Provider, MD  aspirin EC 81 MG tablet Take 81 mg by mouth daily.    Historical Provider, MD  carvedilol (COREG) 12.5 MG tablet Take 1 tablet (12.5 mg total) by mouth 2 (two) times daily with a meal. 06/18/14   Rhonda G Barrett, PA-C  furosemide (LASIX) 20 MG tablet Take 20 mg by mouth 2 (two) times daily.    Historical Provider, MD  lisinopril (PRINIVIL,ZESTRIL) 20 MG tablet Take 20 mg by mouth every morning.    Historical Provider, MD  meclizine (ANTIVERT) 50 MG tablet Take 1 tablet (50 mg total) by mouth 3 (three) times daily as needed for dizziness. 02/23/15   Forde Dandy, MD   BP 124/78 mmHg  Pulse 67  Temp(Src) 97.4 F (36.3 C) (Oral)  Resp 14  SpO2 100% Physical Exam Physical Exam  Nursing note and vitals reviewed. Constitutional: Well developed, well nourished, non-toxic, and in no acute distress Head: Normocephalic and atraumatic.  Mouth/Throat: Oropharynx is clear and moist.  Neck: Normal range of motion. Neck supple.  Cardiovascular: Normal rate and regular rhythm.   Pulmonary/Chest: Effort normal and breath sounds normal.  Abdominal: Soft. There is no tenderness. There is no rebound and no guarding.  Musculoskeletal: Normal range of motion.  Skin: Skin is warm and dry.  Psychiatric: Cooperative Neurological:  Alert, oriented to person, place, time, and situation. Memory grossly in tact. Fluent speech. No dysarthria or aphasia.  Cranial nerves: VF are full. Pupils are symmetric, and reactive to light. EOMI without vertical or bidirectional nystagmus.  No gaze deviation. Facial muscles symmetric with activation. Sensation to light touch over face in tact  bilaterally. Hearing grossly in tact. Palate elevates symmetrically. Head turn and shoulder shrug are intact. Tongue midline. Head impulse test positive with head turn to the left.  Reflexes defered.  Muscle bulk and tone normal. No pronator drift. Moves all extremities symmetrically. Sensation to light touch is in tact throughout in bilateral upper and lower extremities. Coordination reveals no dysmetria with finger to nose. Gait is narrow-based and steady. Non-ataxic.    ED Course  Procedures (including critical care time) Labs Review Labs Reviewed  BASIC METABOLIC PANEL - Abnormal; Notable for the following:    Glucose, Bld 119 (*)    All other components within normal limits  CBC WITH DIFFERENTIAL/PLATELET  MAGNESIUM  I-STAT TROPOININ, ED    Imaging Review No results found. I have personally reviewed and evaluated these images and lab results as part of my medical decision-making.   EKG Interpretation   Date/Time:  Friday February 23 2015 13:43:32 EST Ventricular Rate:  78 PR Interval:  142 QRS Duration: 132 QT Interval:  415 QTC Calculation: 473 R Axis:   -10 Text Interpretation:  Sinus rhythm Left bundle branch block No change from  prior Confirmed by Masai Kidd MD, Lloyd Cullinan [phone]) on 02/23/2015 1:49:13 PM      MDM   Final diagnoses:  Vertigo  Dizziness    70 year old female who presents with dizziness, that sounds vertiginous in nature. It is well-appearing and in no acute distress, with stable vital signs. She is neurologically intact, and without concerning ataxia, dysmetria, or abnormal bidirectional or vertical nystagmus to suggest central etiology. She does have positive head thrust with head turning to the left. She is given a dose of meclizine, and while in the emergency department has significant improvement in her symptoms. Ambulates steadily and feels she is appropriate for discharge home. Strict return and follow-up instructions reviewed. She expressed  understanding of all discharge instructions and felt comfortable with the plan of care.     Forde Dandy, MD 02/24/15 [id number]

## 2015-02-23 NOTE — Discharge Instructions (Signed)
Please return without fail for worsening symptoms, including slurring of speech, difficulty swallowing, passing out, numbness or weakness of face/legs/arms, vision changes, vomiting and unable to keep down food/fluids, or difficulty walking. Take medication only if you have recurrent of dizziness, but if symptoms worsening or not improving return for re-evaluation.   Vertigo Vertigo means that you feel like you are moving when you are not. Vertigo can also make you feel like things around you are moving when they are not. This feeling can come and go at any time. Vertigo often goes away on its own. HOME CARE  Avoid making fast movements.  Avoid driving.  Avoid using heavy machinery.  Avoid doing any task or activity that might cause danger to you or other people if you would have a vertigo attack while you are doing it.  Sit down right away if you feel dizzy or have trouble with your balance.  Take over-the-counter and prescription medicines only as told by your doctor.  Follow instructions from your doctor about which positions or movements you should avoid.  Drink enough fluid to keep your pee (urine) clear or pale yellow.  Keep all follow-up visits as told by your doctor. This is important. GET HELP IF:  Medicine does not help your vertigo.  You have a fever.  Your problems get worse or you have new symptoms.  Your family or friends see changes in your behavior.  You feel sick to your stomach (nauseous) or you throw up (vomit).  You have a "pins and needles" feeling or you are numb in part of your body. GET HELP RIGHT AWAY IF:  You have trouble moving or talking.  You are always dizzy.  You pass out (faint).  You get very bad headaches.  You feel weak or have trouble using your hands, arms, or legs.  You have changes in your hearing.  You have changes in your seeing (vision).  You get a stiff neck.  Bright light starts to bother you.   This information is  not intended to replace advice given to you by your health care provider. Make sure you discuss any questions you have with your health care provider.   Document Released: 11/06/2007 Document Revised: 10/18/2014 Document Reviewed: 05/22/2014 Elsevier Interactive Patient Education 2016 Else[employer] Inc[employer]    Dizziness Dizziness is a common problem. It is a feeling of unsteadiness or light-headedness. You may feel like you are about to faint. Dizziness can lead to injury if you stumble or fall. Anyone can become dizzy, but dizziness is more common in older adults. This condition can be caused by a number of things, including medicines, dehydration, or illness. HOME CARE INSTRUCTIONS Taking these steps may help with your condition: Eating and Drinking  Drink enough fluid to keep your urine clear or pale yellow. This helps to keep you from becoming dehydrated. Try to drink more clear fluids, such as water.  Do not drink alcohol.  Limit your caffeine intake if directed by your health care provider.  Limit your salt intake if directed by your health care provider. Activity  Avoid making quick movements.  Rise slowly from chairs and steady yourself until you feel okay.  In the morning, first sit up on the side of the bed. When you feel okay, stand slowly while you hold onto something until you know that your balance is fine.  Move your legs often if you need to stand in one place for a long time. Tighten and relax your muscles  in your legs while you are standing.  Do not drive or operate heavy machinery if you feel dizzy.  Avoid bending down if you feel dizzy. Place items in your home so that they are easy for you to reach without leaning over. Lifestyle  Do not use any tobacco products, including cigarettes, chewing tobacco, or electronic cigarettes. If you need help quitting, ask your health care provider.  Try to reduce your stress level, such as with yoga or meditation. Talk with your  health care provider if you need help. General Instructions  Watch your dizziness for any changes.  Take medicines only as directed by your health care provider. Talk with your health care provider if you think that your dizziness is caused by a medicine that you are taking.  Tell a friend or a family member that you are feeling dizzy. If he or she notices any changes in your behavior, have this person call your health care provider.  Keep all follow-up visits as directed by your health care provider. This is important. SEEK MEDICAL CARE IF:  Your dizziness does not go away.  Your dizziness or light-headedness gets worse.  You feel nauseous.  You have reduced hearing.  You have new symptoms.  You are unsteady on your feet or you feel like the room is spinning. SEEK IMMEDIATE MEDICAL CARE IF:  You vomit or have diarrhea and are unable to eat or drink anything.  You have problems talking, walking, swallowing, or using your arms, hands, or legs.  You feel generally weak.  You are not thinking clearly or you have trouble forming sentences. It may take a friend or family member to notice this.  You have chest pain, abdominal pain, shortness of breath, or sweating.  Your vision changes.  You notice any bleeding.  You have a headache.  You have neck pain or a stiff neck.  You have a fever.   This information is not intended to replace advice given to you by your health care provider. Make sure you discuss any questions you have with your health care provider.   Document Released: 07/23/2000 Document Revised: 06/13/2014 Document Reviewed: 01/23/2014 Elsevier Interactive Patient Education [employer].

## 2015-02-23 NOTE — ED Notes (Signed)
Per EMS, pt had a sudden onset of dizziness at 1240 with no precipitating factors. Pt felt so dizzy she felt like she was going to pass out. Pt alert x4. Pt still reports dizziness at this time.

## 2015-07-16 ENCOUNTER — Other Ambulatory Visit: Payer: Self-pay | Admitting: Geriatric Medicine

## 2015-07-16 DIAGNOSIS — Z1231 Encounter for screening mammogram for malignant neoplasm of breast: Secondary | ICD-10-CM

## 2015-07-17 ENCOUNTER — Other Ambulatory Visit: Payer: Self-pay | Admitting: Nurse Practitioner

## 2015-07-17 ENCOUNTER — Ambulatory Visit
Admission: RE | Admit: 2015-07-17 | Discharge: 2015-07-17 | Disposition: A | Discharge status: Home / Self Care | Payer: Medicare Other | Source: Ambulatory Visit | Attending: Nurse Practitioner | Admitting: Nurse Practitioner

## 2015-07-17 DIAGNOSIS — I82442 Acute embolism and thrombosis of left tibial vein: Secondary | ICD-10-CM

## 2015-07-27 ENCOUNTER — Ambulatory Visit
Admission: RE | Admit: 2015-07-27 | Discharge: 2015-07-27 | Disposition: A | Discharge status: Home / Self Care | Payer: Self-pay | Source: Ambulatory Visit | Attending: Geriatric Medicine | Admitting: Geriatric Medicine

## 2015-07-27 DIAGNOSIS — Z1231 Encounter for screening mammogram for malignant neoplasm of breast: Secondary | ICD-10-CM

## 2015-07-30 ENCOUNTER — Encounter (HOSPITAL_COMMUNITY): Payer: Self-pay | Admitting: Orthopaedic Surgery

## 2017-04-06 ENCOUNTER — Ambulatory Visit (INDEPENDENT_AMBULATORY_CARE_PROVIDER_SITE_OTHER): Payer: Self-pay | Admitting: Orthopaedic Surgery

## 2017-04-15 ENCOUNTER — Ambulatory Visit (INDEPENDENT_AMBULATORY_CARE_PROVIDER_SITE_OTHER): Payer: Medicare Other

## 2017-04-15 ENCOUNTER — Ambulatory Visit (INDEPENDENT_AMBULATORY_CARE_PROVIDER_SITE_OTHER): Payer: Medicare Other | Admitting: Orthopaedic Surgery

## 2017-04-15 DIAGNOSIS — M1712 Unilateral primary osteoarthritis, left knee: Secondary | ICD-10-CM

## 2017-04-15 DIAGNOSIS — M25562 Pain in left knee: Secondary | ICD-10-CM

## 2017-04-15 DIAGNOSIS — G8929 Other chronic pain: Secondary | ICD-10-CM | POA: Diagnosis not present

## 2017-04-15 NOTE — Progress Notes (Signed)
Office Visit Note   Patient: [name]           Date of Birth: 25-Jun-1945           MRN: [id number] Visit Date: 04/15/2017              Requested by: Lottie Dawson, MD [place]  [address] [place], [place] [address] PCP: Kelton Pillar, Herschell Dimes, MD   Assessment & Plan: Visit Diagnoses:  1. Chronic pain of left knee   2. Unilateral primary osteoarthritis, left knee     Plan: At this point having tried and failed all forms conservative treatment we are recommending a total knee arthroplasty of her left knee.  I went over her x-rays in the knee model and detail with her.  We had a long and thorough discussion about the risk and benefits of the surgery.  At this point she has no family help and would likely need short-term skilled nursing following surgery to help with her about this and about trying to arrange some type of help from family or friends as well.  All questions concerns were answered and addressed.  We will work on getting this scheduled sometime in April or May per her wishes.  Follow-Up Instructions: Return for 2 weeks post-op.   Orders:  Orders Placed This Encounter  Procedures  . XR Knee 1-2 Views Left   No orders of the defined types were placed in this encounter.     Procedures: No procedures performed   Clinical Data: No additional findings.   Subjective: Chief Complaint  Patient presents with  . Left Knee - Pain  Patient is someone I seen for multiple years now and this means over 8 years.  She actually had a right total hip arthroplasty done by me 6 years ago.  She said this done well.  Her left knee and right knee have known severe tricompartmental arthritic changes.  She is tried and failed all forms of conservative treatment.  This includes attempts at weight loss, activity modification, anti-inflammatory medications and multiple injections.  At this point her pain is daily and it is 10 out of 10.  It is detrimentally  affected her activity living, quality of life, mobility.  Her left knee is the one is much more painful to her.  HPI  Review of Systems She currently denies any headache, chest pain, shortness of breath, fever, chills, nausea, vomiting.  She has not a diabetic.  Objective: Vital Signs: There were no vitals taken for this visit.  Physical Exam She is alert and oriented x3 and in no acute distress Ortho Exam Examination of both knees show fluid and full range of motion.  Both knees have varus malalignment with medial joint line tenderness and patellofemoral crepitation. Specialty Comments:  No specialty comments available.  Imaging: Xr Knee 1-2 Views Left  Result Date: 04/15/2017 2 views of the left knee show severe end-stage arthritic changes with varus malalignment.  There are tricompartment arthritic changes in all 3 compartments and significant medial joint space narrowing.  There are periarticular osteophytes throughout the knee.    PMFS History: Patient Active Problem List   Diagnosis Date Noted  . Chronic pain of left knee 04/15/2017  . Unilateral primary osteoarthritis, left knee 04/15/2017  . Hypertension   . Chest pain with low risk for cardiac etiology 06/17/2014  . Degenerative arthritis of hip 04/04/2011   Past Medical History:  Diagnosis Date  . Arthritis   .  Hyperlipidemia   . Hypertension   . Sleep apnea    possibly  . Spinal stenosis     Family History  Problem Relation Age of Onset  . Hypertension Mother   . Heart disease Mother   . Hypertension Father   . Heart disease Father   . Heart attack Father   . Prostate cancer Brother   . Heart attack Brother   . Heart disease Brother     Past Surgical History:  Procedure Laterality Date  . ABDOMINAL HYSTERECTOMY  1970's  . ABDOMINAL HYSTERECTOMY    . APPENDECTOMY  1970's   with hysterectomy  . APPENDECTOMY     with hysterectomy  . COLONOSCOPY    . COLONOSCOPY WITH PROPOFOL N/A 10/04/2013    Procedure: COLONOSCOPY WITH PROPOFOL;  Surgeon: Garlan Fair, MD;  Location: [facility];  Service: Endoscopy;  Laterality: N/A;  . OOPHORECTOMY    . TOTAL HIP ARTHROPLASTY  04/04/2011   Procedure: TOTAL HIP ARTHROPLASTY ANTERIOR APPROACH;  Surgeon: Mcarthur Rossetti, MD;  Location: [facility];  Service: Orthopedics;  Laterality: Right;  . TOTAL HIP ARTHROPLASTY     Social History   Occupational History  . Not on file  Tobacco Use  . Smoking status: Former Smoker    Packs/day: 1.00    Years: 20.00    Pack years: 20.00    Last attempt to quit: 05/23/2011    Years since quitting: 5.9  . Smokeless tobacco: Never Used  Substance and Sexual Activity  . Alcohol use: No    Comment: occassional  . Drug use: No  . Sexual activity: Not on file

## 2017-04-20 ENCOUNTER — Observation Stay (HOSPITAL_COMMUNITY)
Admission: EM | Admit: 2017-04-20 | Discharge: 2017-04-21 | Disposition: A | Discharge status: Home / Self Care | Payer: Medicare Other | Attending: Internal Medicine | Admitting: Internal Medicine

## 2017-04-20 ENCOUNTER — Other Ambulatory Visit (HOSPITAL_COMMUNITY): Payer: Medicare Other

## 2017-04-20 ENCOUNTER — Emergency Department (HOSPITAL_COMMUNITY): Payer: Medicare Other

## 2017-04-20 ENCOUNTER — Encounter (HOSPITAL_COMMUNITY): Payer: Self-pay | Admitting: Emergency Medicine

## 2017-04-20 ENCOUNTER — Observation Stay (HOSPITAL_COMMUNITY): Payer: Medicare Other

## 2017-04-20 ENCOUNTER — Other Ambulatory Visit: Payer: Self-pay

## 2017-04-20 DIAGNOSIS — Z6841 Body Mass Index (BMI) 40.0 and over, adult: Secondary | ICD-10-CM | POA: Insufficient documentation

## 2017-04-20 DIAGNOSIS — Z96641 Presence of right artificial hip joint: Secondary | ICD-10-CM | POA: Insufficient documentation

## 2017-04-20 DIAGNOSIS — R0602 Shortness of breath: Secondary | ICD-10-CM | POA: Insufficient documentation

## 2017-04-20 DIAGNOSIS — Z7982 Long term (current) use of aspirin: Secondary | ICD-10-CM | POA: Diagnosis not present

## 2017-04-20 DIAGNOSIS — N179 Acute kidney failure, unspecified: Secondary | ICD-10-CM | POA: Insufficient documentation

## 2017-04-20 DIAGNOSIS — E860 Dehydration: Secondary | ICD-10-CM | POA: Insufficient documentation

## 2017-04-20 DIAGNOSIS — E669 Obesity, unspecified: Secondary | ICD-10-CM | POA: Insufficient documentation

## 2017-04-20 DIAGNOSIS — Z79899 Other long term (current) drug therapy: Secondary | ICD-10-CM | POA: Diagnosis not present

## 2017-04-20 DIAGNOSIS — Z87891 Personal history of nicotine dependence: Secondary | ICD-10-CM | POA: Insufficient documentation

## 2017-04-20 DIAGNOSIS — Z9049 Acquired absence of other specified parts of digestive tract: Secondary | ICD-10-CM | POA: Diagnosis not present

## 2017-04-20 DIAGNOSIS — I447 Left bundle-branch block, unspecified: Secondary | ICD-10-CM | POA: Diagnosis not present

## 2017-04-20 DIAGNOSIS — I10 Essential (primary) hypertension: Secondary | ICD-10-CM | POA: Diagnosis not present

## 2017-04-20 DIAGNOSIS — M7989 Other specified soft tissue disorders: Secondary | ICD-10-CM | POA: Insufficient documentation

## 2017-04-20 DIAGNOSIS — Z8249 Family history of ischemic heart disease and other diseases of the circulatory system: Secondary | ICD-10-CM | POA: Insufficient documentation

## 2017-04-20 DIAGNOSIS — R079 Chest pain, unspecified: Secondary | ICD-10-CM | POA: Diagnosis not present

## 2017-04-20 DIAGNOSIS — I200 Unstable angina: Secondary | ICD-10-CM

## 2017-04-20 DIAGNOSIS — E785 Hyperlipidemia, unspecified: Secondary | ICD-10-CM | POA: Insufficient documentation

## 2017-04-20 DIAGNOSIS — I2510 Atherosclerotic heart disease of native coronary artery without angina pectoris: Secondary | ICD-10-CM | POA: Diagnosis not present

## 2017-04-20 DIAGNOSIS — R7303 Prediabetes: Secondary | ICD-10-CM | POA: Diagnosis not present

## 2017-04-20 LAB — CBC
HCT: 39.7 % (ref 36.0–46.0)
Hemoglobin: 13.4 g/dL (ref 12.0–15.0)
MCH: 31.8 pg (ref 26.0–34.0)
MCHC: 33.8 g/dL (ref 30.0–36.0)
MCV: 94.3 fL (ref 78.0–100.0)
PLATELETS: 342 10*3/uL (ref 150–400)
RBC: 4.21 MIL/uL (ref 3.87–5.11)
RDW: 13.8 % (ref 11.5–15.5)
WBC: 7.3 10*3/uL (ref 4.0–10.5)

## 2017-04-20 LAB — BASIC METABOLIC PANEL
Anion gap: 11 (ref 5–15)
BUN: 10 mg/dL (ref 6–20)
CHLORIDE: 104 mmol/L (ref 101–111)
CO2: 22 mmol/L (ref 22–32)
CREATININE: 1.15 mg/dL — AB (ref 0.44–1.00)
Calcium: 9.3 mg/dL (ref 8.9–10.3)
GFR calc non Af Amer: 47 mL/min — ABNORMAL LOW (ref >60)
GFR, EST AFRICAN AMERICAN: 55 mL/min — AB (ref >60)
Glucose, Bld: 183 mg/dL — ABNORMAL HIGH (ref 65–99)
POTASSIUM: 3.8 mmol/L (ref 3.5–5.1)
SODIUM: 137 mmol/L (ref 135–145)

## 2017-04-20 LAB — LIPID PANEL
Cholesterol: 167 mg/dL (ref 0–200)
HDL: 36 mg/dL — AB (ref >40)
LDL Cholesterol: 116 mg/dL — ABNORMAL HIGH (ref 0–99)
Total CHOL/HDL Ratio: 4.6 RATIO
Triglycerides: 74 mg/dL (ref <150)
VLDL: 15 mg/dL (ref 0–40)

## 2017-04-20 LAB — TROPONIN I
Troponin I: <0.03 ng/mL (ref <0.03)
Troponin I: <0.03 ng/mL (ref <0.03)
Troponin I: <0.03 ng/mL (ref <0.03)

## 2017-04-20 LAB — HEPATIC FUNCTION PANEL
ALBUMIN: 3.3 g/dL — AB (ref 3.5–5.0)
ALK PHOS: 59 U/L (ref 38–126)
ALT: 13 U/L — ABNORMAL LOW (ref 14–54)
AST: 15 U/L (ref 15–41)
BILIRUBIN TOTAL: 0.3 mg/dL (ref 0.3–1.2)
Bilirubin, Direct: <0.1 mg/dL — ABNORMAL LOW (ref 0.1–0.5)
TOTAL PROTEIN: 6.3 g/dL — AB (ref 6.5–8.1)

## 2017-04-20 LAB — I-STAT TROPONIN, ED: Troponin i, poc: 0 ng/mL (ref 0.00–0.08)

## 2017-04-20 LAB — BRAIN NATRIURETIC PEPTIDE: B NATRIURETIC PEPTIDE 5: 42.3 pg/mL (ref 0.0–100.0)

## 2017-04-20 LAB — HEMOGLOBIN A1C
HEMOGLOBIN A1C: 6 % — AB (ref 4.8–5.6)
MEAN PLASMA GLUCOSE: 125.5 mg/dL

## 2017-04-20 MED ORDER — ONDANSETRON HCL 4 MG/2ML IJ SOLN: 4.0000 mg | Freq: Four times a day (QID) | INTRAMUSCULAR | Status: DC | PRN

## 2017-04-20 MED ORDER — ENOXAPARIN SODIUM 40 MG/0.4ML ~~LOC~~ SOLN
40.0000 mg | SUBCUTANEOUS | Status: DC
  Administered 2017-04-20: 40 mg via SUBCUTANEOUS
  Filled 2017-04-20 (×2): qty 0.4

## 2017-04-20 MED ORDER — ATORVASTATIN CALCIUM 10 MG PO TABS
10.0000 mg | ORAL_TABLET | Freq: Every day | ORAL | Status: DC
  Administered 2017-04-20 – 2017-04-21 (×2): 10 mg via ORAL
  Filled 2017-04-20 (×2): qty 1

## 2017-04-20 MED ORDER — SODIUM CHLORIDE 0.9 % IV BOLUS (SEPSIS)
250.0000 mL | Freq: Once | INTRAVENOUS | Status: AC
  Administered 2017-04-20: 250 mL via INTRAVENOUS

## 2017-04-20 MED ORDER — IOPAMIDOL (ISOVUE-370) INJECTION 76%
INTRAVENOUS | Status: AC
  Filled 2017-04-20: qty 100

## 2017-04-20 MED ORDER — NITROGLYCERIN 0.4 MG SL SUBL
0.4000 mg | SUBLINGUAL_TABLET | SUBLINGUAL | Status: DC | PRN
  Administered 2017-04-20: 0.4 mg via SUBLINGUAL
  Filled 2017-04-20: qty 1

## 2017-04-20 MED ORDER — NITROGLYCERIN 0.4 MG SL SUBL
SUBLINGUAL_TABLET | SUBLINGUAL | Status: AC
  Filled 2017-04-20: qty 2

## 2017-04-20 MED ORDER — CARVEDILOL 12.5 MG PO TABS
12.5000 mg | ORAL_TABLET | Freq: Two times a day (BID) | ORAL | Status: DC
  Administered 2017-04-20 – 2017-04-21 (×3): 12.5 mg via ORAL
  Filled 2017-04-20 (×3): qty 1

## 2017-04-20 MED ORDER — ASPIRIN EC 81 MG PO TBEC
81.0000 mg | DELAYED_RELEASE_TABLET | Freq: Every day | ORAL | Status: DC
  Administered 2017-04-20 – 2017-04-21 (×2): 81 mg via ORAL
  Filled 2017-04-20 (×2): qty 1

## 2017-04-20 MED ORDER — NITROGLYCERIN 0.4 MG SL SUBL
0.8000 mg | SUBLINGUAL_TABLET | Freq: Once | SUBLINGUAL | Status: AC
  Administered 2017-04-20: 0.8 mg via SUBLINGUAL

## 2017-04-20 MED ORDER — ACETAMINOPHEN 325 MG PO TABS
650.0000 mg | ORAL_TABLET | ORAL | Status: DC | PRN
  Administered 2017-04-20: 650 mg via ORAL
  Filled 2017-04-20: qty 2

## 2017-04-20 MED ORDER — AMLODIPINE BESYLATE 5 MG PO TABS
5.0000 mg | ORAL_TABLET | Freq: Every morning | ORAL | Status: DC
  Administered 2017-04-20 – 2017-04-21 (×2): 5 mg via ORAL
  Filled 2017-04-20 (×2): qty 1

## 2017-04-20 NOTE — H&P (Signed)
History and Physical    [name] [id number] DOB: 04-05-1945 DOA: 04/20/2017  PCP: Lottie Dawson, MD  Patient coming from: Home  I have personally briefly reviewed patient's old medical records in [place]  Chief Complaint: CP  HPI: [name] is a 72 y.o. female with medical history significant of HTN, HLD.  Patient presents to the ED with c/o CP.  Pain located in center of chest, woke her from sleep around 1130PM earlier this evening.  Associated chills, nausea, SOB.  No diaphoresis.  Called EMS.  Given ASA.  Last stress test May 2016, low risk.   ED Course: Trop neg, EKG shows LBBB but this was present back in 2016 (and 2017 EKGs too) on multiple EKGs.  CP resolved after 3 SL NTG.   Review of Systems: As per HPI otherwise 10 point review of systems negative.   Past Medical History:  Diagnosis Date  . Arthritis   . Hyperlipidemia   . Hypertension   . Sleep apnea    possibly  . Spinal stenosis     Past Surgical History:  Procedure Laterality Date  . ABDOMINAL HYSTERECTOMY  1970's  . ABDOMINAL HYSTERECTOMY    . APPENDECTOMY  1970's   with hysterectomy  . APPENDECTOMY     with hysterectomy  . COLONOSCOPY    . COLONOSCOPY WITH PROPOFOL N/A 10/04/2013   Procedure: COLONOSCOPY WITH PROPOFOL;  Surgeon: Garlan Fair, MD;  Location: [facility];  Service: Endoscopy;  Laterality: N/A;  . OOPHORECTOMY    . TOTAL HIP ARTHROPLASTY  04/04/2011   Procedure: TOTAL HIP ARTHROPLASTY ANTERIOR APPROACH;  Surgeon: Mcarthur Rossetti, MD;  Location: [facility];  Service: Orthopedics;  Laterality: Right;  . TOTAL HIP ARTHROPLASTY       reports that she quit smoking about 5 years ago. She has a 20.00 pack-year smoking history. she has never used smokeless tobacco. She reports that she does not drink alcohol or use drugs.  Allergies  Allergen Reactions  . Kiwi Extract Itching  . Pineapple Itching    Family History  Problem Relation Age of Onset   . Hypertension Mother   . Heart disease Mother   . Hypertension Father   . Heart disease Father   . Heart attack Father   . Prostate cancer Brother   . Heart attack Brother   . Heart disease Brother      Prior to Admission medications   Medication Sig Start Date End Date Taking? Authorizing Provider  amLODipine (NORVASC) 5 MG tablet Take 5 mg by mouth every morning.   Yes [provider]  aspirin EC 81 MG tablet Take 81 mg by mouth daily.   Yes [provider]  atorvastatin (LIPITOR) 10 MG tablet Take 10 mg by mouth daily. 02/24/17  Yes [provider]  carvedilol (COREG) 12.5 MG tablet Take 1 tablet (12.5 mg total) by mouth 2 (two) times daily with a meal. 06/18/14  Yes Barrett, Evelene Croon, PA-C  furosemide (LASIX) 20 MG tablet Take 20 mg by mouth 2 (two) times daily.   Yes [provider]    Physical Exam: Vitals:   04/20/17 0115 04/20/17 0130 04/20/17 0145 04/20/17 0300  BP: (!) 179/84 (!) 160/80 119/70 (!) 148/68  Pulse: 76 70 71 (!) 53  Resp: (!) 21 20 16 19   Temp:      TempSrc:      SpO2: 98% 99% 97% 99%  Weight:      Height:  Constitutional: NAD, calm, comfortable Eyes: PERRL, lids and conjunctivae normal ENMT: Mucous membranes are moist. Posterior pharynx clear of any exudate or lesions.Normal dentition.  Neck: normal, supple, no masses, no thyromegaly Respiratory: clear to auscultation bilaterally, no wheezing, no crackles. Normal respiratory effort. No accessory muscle use.  Cardiovascular: Regular rate and rhythm, no murmurs / rubs / gallops. No extremity edema. 2+ pedal pulses. No carotid bruits.  Abdomen: no tenderness, no masses palpated. No hepatosplenomegaly. Bowel sounds positive.  Musculoskeletal: no clubbing / cyanosis. No joint deformity upper and lower extremities. Good ROM, no contractures. Normal muscle tone.  Skin: no rashes, lesions, ulcers. No induration Neurologic: CN 2-12 grossly intact. Sensation intact, DTR  normal. Strength 5/5 in all 4.  Psychiatric: Normal judgment and insight. Alert and oriented x 3. Normal mood.    Labs on Admission: I have personally reviewed following labs and imaging studies  CBC: Recent Labs  Lab 04/20/17 0122  WBC 7.3  HGB 13.4  HCT 39.7  MCV 94.3  PLT 962   Basic Metabolic Panel: Recent Labs  Lab 04/20/17 0122  NA 137  K 3.8  CL 104  CO2 22  GLUCOSE 183*  BUN 10  CREATININE 1.15*  CALCIUM 9.3   GFR: Estimated Creatinine Clearance: 59.6 mL/min (A) (by C-G formula based on SCr of 1.15 mg/dL (H)). Liver Function Tests: No results for input(s): AST, ALT, ALKPHOS, BILITOT, PROT, ALBUMIN in the last 168 hours. No results for input(s): LIPASE, AMYLASE in the last 168 hours. No results for input(s): AMMONIA in the last 168 hours. Coagulation Profile: No results for input(s): INR, PROTIME in the last 168 hours. Cardiac Enzymes: No results for input(s): CKTOTAL, CKMB, CKMBINDEX, TROPONINI in the last 168 hours. BNP (last 3 results) No results for input(s): PROBNP in the last 8760 hours. HbA1C: No results for input(s): HGBA1C in the last 72 hours. CBG: No results for input(s): GLUCAP in the last 168 hours. Lipid Profile: No results for input(s): CHOL, HDL, LDLCALC, TRIG, CHOLHDL, LDLDIRECT in the last 72 hours. Thyroid Function Tests: No results for input(s): TSH, T4TOTAL, FREET4, T3FREE, THYROIDAB in the last 72 hours. Anemia Panel: No results for input(s): VITAMINB12, FOLATE, FERRITIN, TIBC, IRON, RETICCTPCT in the last 72 hours. Urine analysis:    Component Value Date/Time   COLORURINE YELLOW 03/31/2011 [place] 03/31/2011 1347   LABSPEC 1.020 03/31/2011 1347   PHURINE 7.0 03/31/2011 1347   GLUCOSEU NEGATIVE 03/31/2011 1347   HGBUR NEGATIVE 03/31/2011 1347   BILIRUBINUR NEGATIVE 03/31/2011 1347   KETONESUR NEGATIVE 03/31/2011 1347   PROTEINUR NEGATIVE 03/31/2011 1347   UROBILINOGEN 1.0 03/31/2011 1347   NITRITE NEGATIVE  03/31/2011 1347   LEUKOCYTESUR NEGATIVE 03/31/2011 1347    Radiological Exams on Admission: Dg Chest 2 View  Result Date: 04/20/2017 CLINICAL DATA:  72 year old female with chest pain. EXAM: CHEST - 2 VIEW COMPARISON:  Chest radiograph dated 06/17/2014 FINDINGS: The heart size and mediastinal contours are within normal limits. Both lungs are clear. The visualized skeletal structures are unremarkable. IMPRESSION: No active cardiopulmonary disease. Electronically Signed   By: Anner Crete M.D.   On: 04/20/2017 01:44    EKG: Independently reviewed.  Assessment/Plan Principal Problem:   Chest pain, rule out acute myocardial infarction Active Problems:   Hypertension    1. CP rule out - 1. CP obs pathway 2. Serial trops 3. Tele monitor 4. NPO 5. Cards eval in AM 2. HTN - continue home BP meds, will hold lasix due to NPO  status though.  DVT prophylaxis: Lovenox Code Status: Full Family Communication: No family in room Disposition Plan: Home after admit Consults called: Message put in to P.Trent for routine cards eval in AM Admission status: Place in [place], [facility] Pager [phone]  If 7AM-7PM, please contact day team taking care of patient [email or web address] Password [facility]  04/20/2017, 3:54 AM

## 2017-04-20 NOTE — Progress Notes (Signed)
72 year old female admitted earlier this morning for chest pain that woke her the night before.  Also history of hypertension.  Initial EKG noted chronic left bundle branch block and troponins negative.  Seen by cardiology with plans for coronary CT as well as stress echocardiogram.  Coronary CT negative, but stress echo still not resulting.  We will continue to monitor patient, likely discharge in the morning.

## 2017-04-20 NOTE — ED Notes (Signed)
Patient transported to echo ?

## 2017-04-20 NOTE — ED Triage Notes (Signed)
PT states she was sleeping and she was awakened by this central chest pain. Pt describes the pain as annoying with an occasional sting. Pt states she is also nauseated. Pt denies vomiting or diaphoresis.   EMS states the pt had pain 2 hours ago while she was sleeping. EMS reports 20g IV in left AC, 324 baby ASA, 12 Lead shows LBBB history of same.

## 2017-04-20 NOTE — Plan of Care (Signed)
  Activity: Risk for activity intolerance will decrease 04/20/2017 2312 - Progressing by Barton Dubois, RN   Safety: Ability to remain free from injury will improve 04/20/2017 2312 - Progressing by Barton Dubois, RN Pt. Independent with steady gait. Will call for assistance if needed. Call light within reach.

## 2017-04-20 NOTE — Consult Note (Addendum)
Cardiology Consultation:   Patient ID: RETHER RISON; [id number]; Aug 22, 1945   Admit date: 04/20/2017 Date of Consult: 04/20/2017  Primary Care Provider: Lottie Dawson, MD Primary Cardiologist: new - Dr. Sallyanne Kuster, previously Wynonia Lawman (2016, but never seen in his office) Primary Electrophysiologist:     Patient Profile:   [name] is a 72 y.o. female with a hx of HTN, HLD, pre-diabetes, and family history of CAD who is being seen today for the evaluation of chest pain at the request of Dr. Maryland Pink.  History of Present Illness:   [name] was seen in consult (Dr. Wynonia Lawman) in 2016 for atypical chest pain. Given her risk factors and family history, she underwent nuclear stress test that was negative for reversible ischemia. She was discharge with PRN follow up. She was not seen in clinic.   She has recently seen orthopedics for total knee replacement, planned for April/May.   She states that chest pain and nausea woke he rfrom sleep at approximately 10:30-11:00pm last evening. The pain is described as sharp and rated as a 5-6/10. She went to the bathroom and became acutely dizzy and lightheaded. She did not fall or lose consciousness. She called EMS because she lives by herself.   On arrival, she received ASA and was transported to [facility]. She continued having chest pain and was given SL nitro x 1 with relief of her chest pian. She has not had a recurrence of chest pain since that time. She denies new palpitations outside the palpitations she has at baseline. She denies shortness of breath and orthopnea. She has lower extremity swelling at baseline.  Troponin trended and remained negative overnight. EKG with a LBBB that was seen on EKG in 2017. She states her PCP follows her cholesterol and last check was "not great." Will check FLP and LFTs here. She is also pre-diabetic and she reports her last A1c was 6%. Of note, she reports an episode of feet and hand numbness and tingling.  She also does not take xarelto (listed on her home meds).     Past Medical History:  Diagnosis Date  . Arthritis   . Hyperlipidemia   . Hypertension   . Sleep apnea    possibly  . Spinal stenosis     Past Surgical History:  Procedure Laterality Date  . ABDOMINAL HYSTERECTOMY  1970's  . ABDOMINAL HYSTERECTOMY    . APPENDECTOMY  1970's   with hysterectomy  . APPENDECTOMY     with hysterectomy  . COLONOSCOPY    . COLONOSCOPY WITH PROPOFOL N/A 10/04/2013   Procedure: COLONOSCOPY WITH PROPOFOL;  Surgeon: Garlan Fair, MD;  Location: [facility];  Service: Endoscopy;  Laterality: N/A;  . OOPHORECTOMY    . TOTAL HIP ARTHROPLASTY  04/04/2011   Procedure: TOTAL HIP ARTHROPLASTY ANTERIOR APPROACH;  Surgeon: Mcarthur Rossetti, MD;  Location: [facility];  Service: Orthopedics;  Laterality: Right;  . TOTAL HIP ARTHROPLASTY       Home Medications:  Prior to Admission medications   Medication Sig Start Date End Date Taking? Authorizing Provider  amLODipine (NORVASC) 5 MG tablet Take 5 mg by mouth every morning.   Yes [provider]  aspirin EC 81 MG tablet Take 81 mg by mouth daily.   Yes [provider]  atorvastatin (LIPITOR) 10 MG tablet Take 10 mg by mouth daily. 02/24/17  Yes [provider]  carvedilol (COREG) 12.5 MG tablet Take 1 tablet (12.5 mg total) by mouth 2 (two) times daily with  a meal. 06/18/14  Yes Barrett, Evelene Croon, PA-C  furosemide (LASIX) 20 MG tablet Take 20 mg by mouth 2 (two) times daily.   Yes [provider]    Inpatient Medications: Scheduled Meds: . amLODipine  5 mg Oral q morning - 10a  . aspirin EC  81 mg Oral Daily  . atorvastatin  10 mg Oral Daily  . carvedilol  12.5 mg Oral BID WC  . enoxaparin (LOVENOX) injection  40 mg Subcutaneous Q24H   Continuous Infusions:  PRN Meds: acetaminophen, nitroGLYCERIN, ondansetron (ZOFRAN) IV  Allergies:    Allergies  Allergen Reactions  . Kiwi Extract Itching  .  Pineapple Itching    Social History:   Social History   Socioeconomic History  . Marital status: Widowed    Spouse name: Not on file  . Number of children: Not on file  . Years of education: Not on file  . Highest education level: Not on file  Social Needs  . Financial resource strain: Not on file  . Food insecurity - worry: Not on file  . Food insecurity - inability: Not on file  . Transportation needs - medical: Not on file  . Transportation needs - non-medical: Not on file  Occupational History  . Not on file  Tobacco Use  . Smoking status: Former Smoker    Packs/day: 1.00    Years: 20.00    Pack years: 20.00    Last attempt to quit: 05/23/2011    Years since quitting: 5.9  . Smokeless tobacco: Never Used  Substance and Sexual Activity  . Alcohol use: No    Comment: occassional  . Drug use: No  . Sexual activity: Not on file  Other Topics Concern  . Not on file  Social History Narrative   ** Merged History Encounter **        Family History:    Family History  Problem Relation Age of Onset  . Hypertension Mother   . Heart disease Mother   . Hypertension Father   . Heart disease Father   . Heart attack Father   . Prostate cancer Brother   . Heart attack Brother   . Heart disease Brother      ROS:  Please see the history of present illness.   All other ROS reviewed and negative.     Physical Exam/Data:   Vitals:   04/20/17 0400 04/20/17 0500 04/20/17 0554 04/20/17 0700  BP: (!) 118/52 (!) 121/47 130/76 129/76  Pulse: (!) 54 (!) 50 63 (!) 57  Resp: 16 20 (!) 29 17  Temp:      TempSrc:      SpO2: 97% 96% 99% 98%  Weight:      Height:        Intake/Output Summary (Last 24 hours) at 04/20/2017 1110 Last data filed at 04/20/2017 0415 Gross per 24 hour  Intake 250 ml  Output -  Net 250 ml   Filed Weights   04/20/17 0113  Weight: 261 lb (118.4 kg)   Body mass index is 42.13 kg/m.  General:  Well nourished, well developed, in no acute  distress HEENT: normal Neck: no JVD Vascular: No carotid bruits  Cardiac:  normal S1, S2; RRR; no murmur  Lungs:  clear to auscultation bilaterally, no wheezing, rhonchi or rales  Abd: soft, nontender, no hepatomegaly  Ext: trace non-pitting edema Musculoskeletal:  No deformities, BUE and BLE strength normal and equal Skin: warm and dry  Neuro:  CNs 2-12 intact,  no focal abnormalities noted Psych:  Normal affect   EKG:  The EKG was personally reviewed and demonstrates:  Sinus with LBBB Telemetry:  Telemetry was personally reviewed and demonstrates:  Sinus bradycardia  Relevant CV Studies:  Myoview 06/18/14: 1. Small non reversible area of decreased myocardial perfusion at the anteroseptal portion of LEFT ventricular apex, potentially representing a breast attenuation artifact. No other perfusion defects identified. 2. Normal left ventricular wall motion. 3. Left ventricular ejection fraction 65% 4. Low-risk stress test findings*.  Laboratory Data:  Chemistry Recent Labs  Lab 04/20/17 0122  NA 137  K 3.8  CL 104  CO2 22  GLUCOSE 183*  BUN 10  CREATININE 1.15*  CALCIUM 9.3  GFRNONAA 47*  GFRAA 55*  ANIONGAP 11    No results for input(s): PROT, ALBUMIN, AST, ALT, ALKPHOS, BILITOT in the last 168 hours. Hematology Recent Labs  Lab 04/20/17 0122  WBC 7.3  RBC 4.21  HGB 13.4  HCT 39.7  MCV 94.3  MCH 31.8  MCHC 33.8  RDW 13.8  PLT 342   Cardiac Enzymes Recent Labs  Lab 04/20/17 0414 04/20/17 0659 04/20/17 0929  TROPONINI <0.03 <0.03 <0.03    Recent Labs  Lab 04/20/17 0127  TROPIPOC 0.00    BNP Recent Labs  Lab 04/20/17 0122  BNP 42.3    DDimer No results for input(s): DDIMER in the last 168 hours.  Radiology/Studies:  Dg Chest 2 View  Result Date: 04/20/2017 CLINICAL DATA:  73 year old female with chest pain. EXAM: CHEST - 2 VIEW COMPARISON:  Chest radiograph dated 06/17/2014 FINDINGS: The heart size and mediastinal contours are within normal  limits. Both lungs are clear. The visualized skeletal structures are unremarkable. IMPRESSION: No active cardiopulmonary disease. Electronically Signed   By: Anner Crete M.D.   On: 04/20/2017 01:44    Assessment and Plan:   1. Chest pain Troponin x 4 negative. EKG with LBBB, not new.  Her chest pain sounds atypical and her CE remains negative. She has not had a recurrence of chest pain since the initial episode. Given her risk factors (HTN, HLD, pre-diabetes, and family history) in addition to her upcoming knee replacement surgery, she would benefit from a definitive imaging. Will send for coronary CT. Given that she has been on chronic lasix, will also obtain an echocardiogram.    2. HTN Continue coreg, norvasc. Consider switching norvasc to diltiazem for better pressure control.    3. Lower extremity swelling She reports being on lasix "for years." I do not see a recent echo. Norvasc may not be the best anti-hypertensive for her given this lower extremity swelling.   3. HLD Continue lipitor. No lipid profile in Epic since 2016. She states her PCP follows her cholesterol and last check was "not great." Will check FLP and LFTs here.    4. Pre-diabetes She was also told that she was pre-diabetic and she reports her last A1c was 6%, last checked greater than 3 months prior. We will repeat A1c here.    5. AKI sCr 1.15 this admission. She had normal renal function in 2017. Likely due to dehydration. Hold lasix for now. Will start gentile hydration. She is npo. She states she was recently seen for lower abdominal pain and was told she did NOT have a UTI. She has not had lower abdominal pain since that time.     For questions or updates, please contact [place] Please consult [email or web address] for contact info under Cardiology/STEMI.   Signed, Tami Lin  [facility], PA  04/20/2017 11:10 AM   I have seen and examined the patient along with Diana Bottcher, PA .  I have reviewed  the chart, notes and new data.  I agree with PA's note.  Key new complaints: now asymptomatic, chest pain was sharp. Multiple risk factors for CAD. Key examination changes: obese, mild ankle edema, symmetrical Key new findings / data: LBBB is old (seen in 2017). Normal troponin.  PLAN: Recommend coronary CT angio and echo for further evaluation.  Sanda Klein, MD, [place] [phone] 04/20/2017, 12:27 PM

## 2017-04-20 NOTE — CV Procedure (Addendum)
Attempted 2D Echo at, Nurses  assessing patient, will try again at a later time.  [name]

## 2017-04-20 NOTE — ED Provider Notes (Signed)
[place] EMERGENCY DEPARTMENT Provider Note   CSN: [id number] Arrival date & time: 04/20/17  0101     History   Chief Complaint Chief Complaint  Patient presents with  . Chest Pain    HPI [name] is a 72 y.o. female with a hx of retention, hyperlipidemia presents to the Emergency Department complaining of gradual, persistent, progressively worsening chest pain which woke her from sleep around 11:30 PM.  She reports associated chills, nausea and shortness of breath.  She did not have any diaphoresis.  No aggravating or alleviating factors at that time.  Patient called EMS and she was given aspirin by EMS.  Patient denies recent illness, fever, chills, headache, neck pain, cough, abdominal pain, vomiting, diarrhea, weakness, dizziness, syncope.  Patient reports a family history of premature coronary artery disease.  Record review shows patient was admitted on 06/18/2014 for chest pain or palpitations.  At that time she was evaluated by Dr. Wynonia Lawman.  Her cardiac enzymes were negative and she did have a stress test which found her to be low risk.  She has not had any cardiac follow-up since that time.  She reports to me she has not had any chest pain since that visit.     The history is provided by the patient and medical records. No language interpreter was used.    Past Medical History:  Diagnosis Date  . Arthritis   . Hyperlipidemia   . Hypertension   . Sleep apnea    possibly  . Spinal stenosis     Patient Active Problem List   Diagnosis Date Noted  . Chest pain, rule out acute myocardial infarction 04/20/2017  . Chronic pain of left knee 04/15/2017  . Unilateral primary osteoarthritis, left knee 04/15/2017  . Hypertension   . Chest pain with low risk for cardiac etiology 06/17/2014  . Degenerative arthritis of hip 04/04/2011    Past Surgical History:  Procedure Laterality Date  . ABDOMINAL HYSTERECTOMY  1970's  . ABDOMINAL HYSTERECTOMY    .  APPENDECTOMY  1970's   with hysterectomy  . APPENDECTOMY     with hysterectomy  . COLONOSCOPY    . COLONOSCOPY WITH PROPOFOL N/A 10/04/2013   Procedure: COLONOSCOPY WITH PROPOFOL;  Surgeon: Garlan Fair, MD;  Location: [facility];  Service: Endoscopy;  Laterality: N/A;  . OOPHORECTOMY    . TOTAL HIP ARTHROPLASTY  04/04/2011   Procedure: TOTAL HIP ARTHROPLASTY ANTERIOR APPROACH;  Surgeon: Mcarthur Rossetti, MD;  Location: [facility];  Service: Orthopedics;  Laterality: Right;  . TOTAL HIP ARTHROPLASTY      OB History    Gravida Para Term Preterm AB Living   0 0 0 0 0     SAB TAB Ectopic Multiple Live Births   0 0 0           Home Medications    Prior to Admission medications   Medication Sig Start Date End Date Taking? Authorizing Provider  amLODipine (NORVASC) 5 MG tablet Take 5 mg by mouth every morning.   Yes [provider]  aspirin EC 81 MG tablet Take 81 mg by mouth daily.   Yes [provider]  atorvastatin (LIPITOR) 10 MG tablet Take 10 mg by mouth daily. 02/24/17  Yes [provider]  carvedilol (COREG) 12.5 MG tablet Take 1 tablet (12.5 mg total) by mouth 2 (two) times daily with a meal. 06/18/14  Yes Barrett, Rhonda G, PA-C  furosemide (LASIX) 20 MG tablet Take  20 mg by mouth 2 (two) times daily.   Yes [provider]  meclizine (ANTIVERT) 50 MG tablet Take 1 tablet (50 mg total) by mouth 3 (three) times daily as needed for dizziness. Patient not taking: Reported on 04/20/2017 02/23/15   Forde Dandy, MD  rivaroxaban (XARELTO) 10 MG TABS tablet Take 1 tablet (10 mg total) by mouth daily with breakfast. Patient not taking: Reported on 04/20/2017 04/08/11   Mcarthur Rossetti, MD    Family History Family History  Problem Relation Age of Onset  . Hypertension Mother   . Heart disease Mother   . Hypertension Father   . Heart disease Father   . Heart attack Father   . Prostate cancer Brother   . Heart attack Brother   . Heart  disease Brother     Social History Social History   Tobacco Use  . Smoking status: Former Smoker    Packs/day: 1.00    Years: 20.00    Pack years: 20.00    Last attempt to quit: 05/23/2011    Years since quitting: 5.9  . Smokeless tobacco: Never Used  Substance Use Topics  . Alcohol use: No    Comment: occassional  . Drug use: No     Allergies   Kiwi extract and Pineapple   Review of Systems Review of Systems  Constitutional: Positive for chills. Negative for appetite change, diaphoresis, fatigue, fever and unexpected weight change.  HENT: Negative for mouth sores.   Eyes: Negative for visual disturbance.  Respiratory: Positive for shortness of breath. Negative for cough, chest tightness and wheezing.   Cardiovascular: Positive for chest pain.  Gastrointestinal: Positive for nausea. Negative for abdominal pain, constipation, diarrhea and vomiting.  Endocrine: Negative for polydipsia, polyphagia and polyuria.  Genitourinary: Negative for dysuria, frequency, hematuria and urgency.  Musculoskeletal: Negative for back pain and neck stiffness.  Skin: Negative for rash.  Allergic/Immunologic: Negative for immunocompromised state.  Neurological: Negative for syncope, light-headedness and headaches.  Hematological: Does not bruise/bleed easily.  Psychiatric/Behavioral: Negative for sleep disturbance. The patient is not nervous/anxious.      Physical Exam Updated Vital Signs BP (!) 148/68   Pulse (!) 53   Temp 98.7 F (37.1 C) (Oral)   Resp 19   Ht 5\' 6"  (1.676 m)   Wt 118.4 kg (261 lb)   SpO2 99%   BMI 42.13 kg/m   Physical Exam  Constitutional: She appears well-developed and well-nourished. No distress.  Awake, alert, nontoxic appearance  HENT:  Head: Normocephalic and atraumatic.  Mouth/Throat: Oropharynx is clear and moist. No oropharyngeal exudate.  Eyes: Conjunctivae are normal. No scleral icterus.  Neck: Normal range of motion. Neck supple.    Cardiovascular: Normal rate, regular rhythm and intact distal pulses.  Pulmonary/Chest: Effort normal and breath sounds normal. No respiratory distress. She has no wheezes.  Equal chest expansion  Abdominal: Soft. Bowel sounds are normal. She exhibits no mass. There is no tenderness. There is no rebound and no guarding.  Musculoskeletal: Normal range of motion.       Right lower leg: She exhibits edema.  Moderate, nonpitting edema of the bilateral lower extremities  Neurological: She is alert.  Speech is clear and goal oriented Moves extremities without ataxia  Skin: Skin is warm and dry. She is not diaphoretic.  Psychiatric: She has a normal mood and affect.  Nursing note and vitals reviewed.    ED Treatments / Results  Labs (all labs ordered are listed, but only abnormal  results are displayed) Labs Reviewed  BASIC METABOLIC PANEL - Abnormal; Notable for the following components:      Result Value   Glucose, Bld 183 (*)    Creatinine, Ser 1.15 (*)    GFR calc non Af Amer 47 (*)    GFR calc Af Amer 55 (*)    All other components within normal limits  CBC  BRAIN NATRIURETIC PEPTIDE  I-STAT TROPONIN, ED    EKG  EKG Interpretation  Date/Time:  Monday April 20 2017 01:12:21 EDT Ventricular Rate:  74 PR Interval:    QRS Duration: 131 QT Interval:  428 QTC Calculation: 475 R Axis:   -15 Text Interpretation:  Sinus rhythm Ventricular premature complex Left bundle branch block - new since 2004 Confirmed by Orpah Greek [phone]) on 04/20/2017 1:39:47 AM       Radiology Dg Chest 2 View  Result Date: 04/20/2017 CLINICAL DATA:  72 year old female with chest pain. EXAM: CHEST - 2 VIEW COMPARISON:  Chest radiograph dated 06/17/2014 FINDINGS: The heart size and mediastinal contours are within normal limits. Both lungs are clear. The visualized skeletal structures are unremarkable. IMPRESSION: No active cardiopulmonary disease. Electronically Signed   By: Anner Crete  M.D.   On: 04/20/2017 01:44    Procedures Procedures (including critical care time)  Medications Ordered in ED Medications  nitroGLYCERIN (NITROSTAT) SL tablet 0.4 mg (0.4 mg Sublingual Given 04/20/17 0128)  sodium chloride 0.9 % bolus 250 mL (not administered)  acetaminophen (TYLENOL) tablet 650 mg (not administered)  ondansetron (ZOFRAN) injection 4 mg (not administered)  enoxaparin (LOVENOX) injection 40 mg (not administered)     Initial Impression / Assessment and Plan / ED Course  I have reviewed the triage vital signs and the nursing notes.  Pertinent labs & imaging results that were available during my care of the patient were reviewed by me and considered in my medical decision making (see chart for details).  Clinical Course as of Apr 20 328  Mon Apr 20, 2017  0308 Nm Myocar Multi W/spect W/wall Motion / Ef 06/18/2014     CLINICAL DATA:  Chest pain, hypertension, shortness of breath, history of rapid heart rate per patient  EXAM: MYOCARDIAL IMAGING WITH SPECT (REST AND PHARMACOLOGIC-STRESS)  GATED LEFT VENTRICULAR WALL MOTION STUDY  LEFT VENTRICULAR EJECTION FRACTION  TECHNIQUE: Standard myocardial SPECT imaging was performed after resting intravenous injection of 10 mCi Tc-60m sestamibi. Subsequently, intravenous infusion of Lexiscan was performed under the supervision of the Cardiology staff. At peak effect of the drug, 30 mCi Tc-71m sestamibi was injected intravenously and standard myocardial SPECT imaging was performed. Quantitative gated imaging was also performed to evaluate left ventricular wall motion, and estimate left ventricular ejection fraction.  COMPARISON:  None  FINDINGS: Perfusion: Small focus of non reversible decreased myocardial perfusion at the anteroseptal portion of LEFT ventricular apex, potentially related to breast attenuation. No additional myocardial perfusion defects.  Wall Motion: Normal left ventricular wall motion. No left ventricular dilation.  Left  Ventricular Ejection Fraction: 65 %  End diastolic volume 68 ml  End systolic volume 24 ml  IMPRESSION: 1. Small non reversible area of decreased myocardial perfusion at the anteroseptal portion of LEFT ventricular apex, potentially representing a breast attenuation artifact. No other perfusion defects identified.  2. Normal left ventricular wall motion.  3. Left ventricular ejection fraction 65%  4. Low-risk stress test findings*.  [HM]  0309 Mild hypertension. BP: (!) 160/80 [HM]  0310 Patient reports she is chest pain-free at  this time after 3 nitroglycerin.  [HM]  0312 Heart Score 5 - Moderate risk  [HM]  0330 Discussed with Dr. Alcario Drought who will admit  [HM]    Clinical Course User Index [HM] Tinia Oravec, Gwenlyn Perking    Presents with chest pain that woke her from sleep.  EKG shows left bundle branch block, unchanged from previous.  Chest x-ray without evidence of pneumonia or pulmonary edema.  I personally evaluated these images.  Patient given aspirin prior to arrival.  Nitroglycerin x3 has resolved her pain.  She had a previous nuclear stress test which found her to be low risk in 2016.  However, heart score is 5 today.  Will admit for overnight observation and chest pain rule out.  The patient was discussed with and seen by Dr. Betsey Holiday who agrees with the treatment plan.   Final Clinical Impressions(s) / ED Diagnoses   Final diagnoses:  Central chest pain  Essential hypertension    ED Discharge Orders    None       Loni Muse Gwenlyn Perking 04/20/17 0331    Orpah Greek, MD 04/20/17 [phone]

## 2017-04-21 ENCOUNTER — Observation Stay (HOSPITAL_BASED_OUTPATIENT_CLINIC_OR_DEPARTMENT_OTHER): Payer: Medicare Other

## 2017-04-21 DIAGNOSIS — I10 Essential (primary) hypertension: Secondary | ICD-10-CM | POA: Diagnosis not present

## 2017-04-21 DIAGNOSIS — I5032 Chronic diastolic (congestive) heart failure: Secondary | ICD-10-CM

## 2017-04-21 DIAGNOSIS — R079 Chest pain, unspecified: Secondary | ICD-10-CM | POA: Diagnosis not present

## 2017-04-21 DIAGNOSIS — I361 Nonrheumatic tricuspid (valve) insufficiency: Secondary | ICD-10-CM

## 2017-04-21 LAB — ECHOCARDIOGRAM COMPLETE
Height: 66 in
WEIGHTICAEL: 4124.8 [oz_av]

## 2017-04-21 NOTE — Progress Notes (Signed)
  Echocardiogram 2D Echocardiogram has been performed.  [name] F 04/21/2017, 9:28 AM

## 2017-04-21 NOTE — Progress Notes (Signed)
Reviewed CT coronary angiography and echo results with the patient. CT coronary angiography and echo results with the patient. All results are reassuring. While she has mild coronary atherosclerosis there is no evidence of coronary stenosis.  Focus should be on coronary risk factor modification. Retrospectively, she believes that her symptoms are often associated with emotional stress, related to one of her family members.  Will arrange for periodic follow-up for hyperlipidemia and left bundle-branch block, but current complaints do not appear to be related to cardiac illness.   Sanda Klein, MD, [facility] [phone] office [phone] pager

## 2017-04-21 NOTE — Progress Notes (Signed)
Pt. HR down to 37 non-sustained. Pt. Easily arousable. Pt. Alert and oriented. No distress noted. On call for [facility], Tylene Fantasia, paged to make aware.

## 2017-04-21 NOTE — Discharge Summary (Signed)
Discharge Summary  [name] [facility]MC:[id number] DOB: 21-Aug-1945  PCP: Lottie Dawson, MD  Admit date: 04/20/2017 Discharge date: 04/21/2017  Time spent: 25 minutes  Recommendations for Outpatient Follow-up:  1. Patient will follow up with PCP in the next 1 month  Discharge Diagnoses:  Active Hospital Problems   Diagnosis Date Noted  . Chest pain, rule out acute myocardial infarction 04/20/2017  . Hypertension     Resolved Hospital Problems  No resolved problems to display.    Discharge Condition: Improved, being discharged home  Diet recommendation: Low-sodium  Vitals:   04/21/17 0951 04/21/17 1141  BP:  137/71  Pulse: 62 (!) 57  Resp:  18  Temp:  98.5 F (36.9 C)  SpO2:  98%    History of present illness:  72 year old female with past medical history of hypertension and hyper lipidemia presented to the emergency room on the early morning of 3/11 with complaints of pain in the center of her chest after woke her from sleep earlier that night.  Some associated chills and shortness of breath.  Not hypoxic when she presented to the emergency room EKG noted chronic left bundle branch block and troponin initially negative.  Brought in for further evaluation.  Hospital Course:  Principal Problem:   Chest pain, rule out acute myocardial infarction: Troponins x3-.  Seen by cardiology and based off of risk score, recommended stress echocardiogram and coronary CT.  Coronary CT negative.  Echocardiogram to grade 1 diastolic dysfunction and preserved ejection fraction.  Mild tricuspid regurg and trivial mitral regurg.  Patient chest pain-free with negative findings on echo and CT, felt to be stable for discharge. Active Problems:   Hypertension: Blood pressure stable Morbid obesity: Patient meets criteria with BMI greater than 40. Chronic diastolic heart failure: Euvolemic, incidentally noted on echocardiogram   Procedures:  Echocardiogram done  3/12  Consultations:  Cardiology  Discharge Exam: BP 137/71 (BP Location: Right Arm)   Pulse (!) 57   Temp 98.5 F (36.9 C) (Oral)   Resp 18   Ht 5\' 6"  (1.676 m)   Wt 116.9 kg (257 lb 12.8 oz) Comment: scale c  SpO2 98%   BMI 41.61 kg/m   General: Alert and oriented x3, no acute distress Cardiovascular: Regular rate and rhythm, S1-S2 Respiratory: Clear to auscultation bilaterally  Discharge Instructions You were cared for by a hospitalist during your hospital stay. If you have any questions about your discharge medications or the care you received while you were in the hospital after you are discharged, you can call the unit and asked to speak with the hospitalist on call if the hospitalist that took care of you is not available. Once you are discharged, your primary care physician will handle any further medical issues. Please note that NO REFILLS for any discharge medications will be authorized once you are discharged, as it is imperative that you return to your primary care physician (or establish a relationship with a primary care physician if you do not have one) for your aftercare needs so that they can reassess your need for medications and monitor your lab values.  Discharge Instructions    Diet - low sodium heart healthy   Complete by:  As directed    Increase activity slowly   Complete by:  As directed      Allergies as of 04/21/2017      Reactions   Kiwi Extract Itching   Pineapple Itching      Medication List  TAKE these medications   amLODipine 5 MG tablet Commonly known as:  NORVASC Take 5 mg by mouth every morning.   aspirin EC 81 MG tablet Take 81 mg by mouth daily.   atorvastatin 10 MG tablet Commonly known as:  LIPITOR Take 10 mg by mouth daily.   carvedilol 12.5 MG tablet Commonly known as:  COREG Take 1 tablet (12.5 mg total) by mouth 2 (two) times daily with a meal.   furosemide 20 MG tablet Commonly known as:  LASIX Take 20 mg by mouth  2 (two) times daily.      Allergies  Allergen Reactions  . Kiwi Extract Itching  . Pineapple Itching   Follow-up Information    French, Diana Dimes, MD Follow up.   Specialty:  Internal Medicine Contact information: [place]  [place] [address] [phone]        French, Diana Gobble, MD Follow up.   Specialty:  Cardiology Why:  08/03/17 at 9:20am. Arrive 15 minutes prior to appointment to check in. Contact information: [address] [place] [address] [phone]            The results of significant diagnostics from this hospitalization (including imaging, microbiology, ancillary and laboratory) are listed below for reference.    Significant Diagnostic Studies: Dg Chest 2 View  Result Date: 04/20/2017 CLINICAL DATA:  72 year old female with chest pain. EXAM: CHEST - 2 VIEW COMPARISON:  Chest radiograph dated 06/17/2014 FINDINGS: The heart size and mediastinal contours are within normal limits. Both lungs are clear. The visualized skeletal structures are unremarkable. IMPRESSION: No active cardiopulmonary disease. Electronically Signed   By: Anner Crete M.D.   On: 04/20/2017 01:44   Ct Coronary Morph W/cta Cor W/score W/ca W/cm &/or Wo/cm  Addendum Date: 04/20/2017   ADDENDUM REPORT: 04/20/2017 17:59 CLINICAL DATA:  72 year old female with chest pain. EXAM: Cardiac/Coronary  CT TECHNIQUE: The patient was scanned on a [employer]. FINDINGS: A 120 kV prospective scan was triggered in the descending thoracic aorta at 111 HU's. Axial non-contrast 3 mm slices were carried out through the heart. The data set was analyzed on a dedicated work station and scored using the [place]. Gantry rotation speed was 250 msecs and collimation was .6 mm. 10 mg of iv Metoprolol and 0.8 mg of sl NTG was given. The 3D data set was reconstructed in 5% intervals of the 67-82 % of the R-R cycle. Diastolic phases were analyzed on a  dedicated work station using MPR, MIP and VRT modes. The patient received 80 cc of contrast. Aorta:  Normal size.  No calcifications.  No dissection. Aortic Valve:  Trileaflet.  No calcifications. Coronary Arteries:  Normal coronary origin.  Left dominance. Left main is a large artery that gives rise to LAD and LCX arteries. LM has no plaque. LAD is a large and long artery that gives rise to two small diagonal arteries, wraps around the apex and partially supplies PDA territory. There is minimal calcified plaque in the proximal segment with associated stenosis 0-25%. LCX is a large dominant artery that gives rise to one large OM1 branch, PLA and a small PDA. There is no plaque. RCA is a small non-dominant artery. Other findings: Normal pulmonary vein drainage into the left atrium. Normal let atrial appendage without a thrombus. Normal size of the pulmonary artery. IMPRESSION: 1. Coronary calcium score of 6. This was 36 percentile for age and sex matched control. 2. Normal coronary origin with right  dominance. 3. 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 Electronically Signed   By: Ena Dawley   On: 04/20/2017 17:59   Result Date: 04/20/2017 EXAM: OVER-READ INTERPRETATION  CT CHEST The following report is an over-read performed by radiologist Dr. Suzy Bouchard of [facility], [place] on 04/20/2017. This over-read does not include interpretation of cardiac or coronary anatomy or pathology. The coronary calcium score/coronary CTA interpretation by the cardiologist is attached. COMPARISON:  None. FINDINGS: Limited view of the lung parenchyma demonstrates no suspicious nodularity. Airways are normal. Limited view of the mediastinum demonstrates no adenopathy. Esophagus normal. Limited view of the upper abdomen unremarkable. Limited view of the skeleton and chest wall is unremarkable. IMPRESSION: No significant extracardiac findings Electronically Signed: By: Suzy Bouchard M.D. On: 04/20/2017 15:52    Xr Knee 1-2 Views Left  Result Date: 04/15/2017 2 views of the left knee show severe end-stage arthritic changes with varus malalignment.  There are tricompartment arthritic changes in all 3 compartments and significant medial joint space narrowing.  There are periarticular osteophytes throughout the knee.   Microbiology: No results found for this or any previous visit (from the past 240 hour(s)).   Labs: Basic Metabolic Panel: Recent Labs  Lab 04/20/17 0122  NA 137  K 3.8  CL 104  CO2 22  GLUCOSE 183*  BUN 10  CREATININE 1.15*  CALCIUM 9.3   Liver Function Tests: Recent Labs  Lab 04/20/17 0929  AST 15  ALT 13*  ALKPHOS 59  BILITOT 0.3  PROT 6.3*  ALBUMIN 3.3*   No results for input(s): LIPASE, AMYLASE in the last 168 hours. No results for input(s): AMMONIA in the last 168 hours. CBC: Recent Labs  Lab 04/20/17 0122  WBC 7.3  HGB 13.4  HCT 39.7  MCV 94.3  PLT 342   Cardiac Enzymes: Recent Labs  Lab 04/20/17 0414 04/20/17 0659 04/20/17 0929  TROPONINI <0.03 <0.03 <0.03   BNP: BNP (last 3 results) Recent Labs    04/20/17 0122  BNP 42.3    ProBNP (last 3 results) No results for input(s): PROBNP in the last 8760 hours.  CBG: No results for input(s): GLUCAP in the last 168 hours.     Signed:  Annita Brod, MD [facility] 04/21/2017, 6:08 PM

## 2017-04-21 NOTE — Care Management Obs Status (Signed)
[facility] NOTIFICATION   Patient Details  Name: [name] MRN: [id number] Date of Birth: 1945/02/27   Medicare Observation Status Notification Given:  Yes    Carles Collet, RN 04/21/2017, 10:09 AM

## 2017-04-21 NOTE — Progress Notes (Signed)
Pt has orders to be discharged. Discharge instructions given and pt has no additional questions at this time. Medication regimen reviewed and pt educated. Pt verbalized understanding and has no additional questions. Telemetry box removed. IV removed and site in good condition. Pt stable and waiting for transportation. 

## 2017-08-03 ENCOUNTER — Ambulatory Visit: Payer: Medicare Other | Admitting: Cardiovascular Disease

## 2017-08-07 ENCOUNTER — Other Ambulatory Visit: Payer: Self-pay | Admitting: Geriatric Medicine

## 2017-08-07 DIAGNOSIS — Z0000 Encounter for general adult medical examination without abnormal findings: Secondary | ICD-10-CM | POA: Diagnosis not present

## 2017-08-07 DIAGNOSIS — Z79899 Other long term (current) drug therapy: Secondary | ICD-10-CM | POA: Diagnosis not present

## 2017-08-07 DIAGNOSIS — R7303 Prediabetes: Secondary | ICD-10-CM | POA: Diagnosis not present

## 2017-08-07 DIAGNOSIS — Z1389 Encounter for screening for other disorder: Secondary | ICD-10-CM | POA: Diagnosis not present

## 2017-08-07 DIAGNOSIS — E7800 Pure hypercholesterolemia, unspecified: Secondary | ICD-10-CM | POA: Diagnosis not present

## 2017-08-07 DIAGNOSIS — I10 Essential (primary) hypertension: Secondary | ICD-10-CM | POA: Diagnosis not present

## 2017-08-07 DIAGNOSIS — Z1231 Encounter for screening mammogram for malignant neoplasm of breast: Secondary | ICD-10-CM

## 2017-08-10 ENCOUNTER — Ambulatory Visit: Payer: Medicare Other

## 2017-08-12 ENCOUNTER — Emergency Department (HOSPITAL_COMMUNITY)
Admission: EM | Admit: 2017-08-12 | Discharge: 2017-08-12 | Disposition: A | Discharge status: Home / Self Care | Payer: Medicare Other | Attending: Emergency Medicine | Admitting: Emergency Medicine

## 2017-08-12 ENCOUNTER — Other Ambulatory Visit: Payer: Self-pay

## 2017-08-12 DIAGNOSIS — Z79899 Other long term (current) drug therapy: Secondary | ICD-10-CM | POA: Insufficient documentation

## 2017-08-12 DIAGNOSIS — K625 Hemorrhage of anus and rectum: Secondary | ICD-10-CM | POA: Diagnosis not present

## 2017-08-12 DIAGNOSIS — Z87891 Personal history of nicotine dependence: Secondary | ICD-10-CM | POA: Insufficient documentation

## 2017-08-12 DIAGNOSIS — Z7982 Long term (current) use of aspirin: Secondary | ICD-10-CM | POA: Insufficient documentation

## 2017-08-12 DIAGNOSIS — I10 Essential (primary) hypertension: Secondary | ICD-10-CM | POA: Diagnosis not present

## 2017-08-12 DIAGNOSIS — E785 Hyperlipidemia, unspecified: Secondary | ICD-10-CM | POA: Insufficient documentation

## 2017-08-12 LAB — COMPREHENSIVE METABOLIC PANEL
ALBUMIN: 3.8 g/dL (ref 3.5–5.0)
ALT: 13 U/L (ref 0–44)
AST: 17 U/L (ref 15–41)
Alkaline Phosphatase: 64 U/L (ref 38–126)
Anion gap: 6 (ref 5–15)
BILIRUBIN TOTAL: 0.4 mg/dL (ref 0.3–1.2)
BUN: 8 mg/dL (ref 8–23)
CALCIUM: 9.1 mg/dL (ref 8.9–10.3)
CO2: 24 mmol/L (ref 22–32)
Chloride: 108 mmol/L (ref 98–111)
Creatinine, Ser: 1.06 mg/dL — ABNORMAL HIGH (ref 0.44–1.00)
GFR calc non Af Amer: 52 mL/min — ABNORMAL LOW (ref >60)
GLUCOSE: 139 mg/dL — AB (ref 70–99)
POTASSIUM: 4.1 mmol/L (ref 3.5–5.1)
SODIUM: 138 mmol/L (ref 135–145)
TOTAL PROTEIN: 7 g/dL (ref 6.5–8.1)

## 2017-08-12 LAB — CBC WITH DIFFERENTIAL/PLATELET
Abs Immature Granulocytes: 0 10*3/uL (ref 0.0–0.1)
BASOS ABS: 0.1 10*3/uL (ref 0.0–0.1)
BASOS PCT: 1 %
EOS ABS: 0.1 10*3/uL (ref 0.0–0.7)
EOS PCT: 3 %
HCT: 41.7 % (ref 36.0–46.0)
Hemoglobin: 13.7 g/dL (ref 12.0–15.0)
Immature Granulocytes: 0 %
Lymphocytes Relative: 33 %
Lymphs Abs: 1.7 10*3/uL (ref 0.7–4.0)
MCH: 31 pg (ref 26.0–34.0)
MCHC: 32.9 g/dL (ref 30.0–36.0)
MCV: 94.3 fL (ref 78.0–100.0)
MONO ABS: 0.4 10*3/uL (ref 0.1–1.0)
Monocytes Relative: 8 %
Neutro Abs: 2.9 10*3/uL (ref 1.7–7.7)
Neutrophils Relative %: 55 %
PLATELETS: 337 10*3/uL (ref 150–400)
RBC: 4.42 MIL/uL (ref 3.87–5.11)
RDW: 13.8 % (ref 11.5–15.5)
WBC: 5.2 10*3/uL (ref 4.0–10.5)

## 2017-08-12 MED ORDER — DOCUSATE SODIUM 100 MG PO CAPS: 100.0000 mg | ORAL_CAPSULE | Freq: Two times a day (BID) | ORAL | 0 refills | Status: DC

## 2017-08-12 NOTE — ED Provider Notes (Signed)
[facility] EMERGENCY DEPARTMENT Provider Note   CSN: [id number] Arrival date & time: 08/12/17  4098     History   Chief Complaint Chief Complaint  Patient presents with  . GI Bleeding    HPI [name] is a 72 y.o. female who presents with hematochezia. PMH significant for HTN, HLD, obesity. Past surgical hx significant for hysterectomy. She states she's had lower abdominal pain for months which has been mild and intermittent. She also has had intermittent diarrhea and constipation. She states that yesterday and this morning she had several episodes of bloody diarrhea which concerned her because she's never had this before. She denies severe abdominal pain but states she does have some mild pain over the right lower quadrant. She reports hx of colonoscopy and has had polyps and hemorrhoids in the past - last one was in 2015 and she is due for a repeat. She is not on blood thinners but takes ASA occassionally.   HPI  Past Medical History:  Diagnosis Date  . Arthritis   . Hyperlipidemia   . Hypertension   . Sleep apnea    possibly  . Spinal stenosis     Patient Active Problem List   Diagnosis Date Noted  . Chest pain, rule out acute myocardial infarction 04/20/2017  . Chronic pain of left knee 04/15/2017  . Unilateral primary osteoarthritis, left knee 04/15/2017  . Hypertension   . Chest pain with low risk for cardiac etiology 06/17/2014  . Degenerative arthritis of hip 04/04/2011    Past Surgical History:  Procedure Laterality Date  . ABDOMINAL HYSTERECTOMY  1970's  . ABDOMINAL HYSTERECTOMY    . APPENDECTOMY  1970's   with hysterectomy  . APPENDECTOMY     with hysterectomy  . COLONOSCOPY    . COLONOSCOPY WITH PROPOFOL N/A 10/04/2013   Procedure: COLONOSCOPY WITH PROPOFOL;  Surgeon: Garlan Fair, MD;  Location: [facility] ENDOSCOPY;  Service: Endoscopy;  Laterality: N/A;  . OOPHORECTOMY    . TOTAL HIP ARTHROPLASTY  04/04/2011   Procedure: TOTAL HIP  ARTHROPLASTY ANTERIOR APPROACH;  Surgeon: Mcarthur Rossetti, MD;  Location: [facility] ORS;  Service: Orthopedics;  Laterality: Right;  . TOTAL HIP ARTHROPLASTY       OB History    Gravida  0   Para  0   Term  0   Preterm  0   AB  0   Living        SAB  0   TAB  0   Ectopic  0   Multiple      Live Births               Home Medications    Prior to Admission medications   Medication Sig Start Date End Date Taking? Authorizing Provider  amLODipine (NORVASC) 5 MG tablet Take 5 mg by mouth every morning.    [provider]  aspirin EC 81 MG tablet Take 81 mg by mouth daily.    [provider]  atorvastatin (LIPITOR) 10 MG tablet Take 10 mg by mouth daily. 02/24/17   [provider]  carvedilol (COREG) 12.5 MG tablet Take 1 tablet (12.5 mg total) by mouth 2 (two) times daily with a meal. 06/18/14   Barrett, Evelene Croon, PA-C  furosemide (LASIX) 20 MG tablet Take 20 mg by mouth 2 (two) times daily.    [provider]    Family History Family History  Problem Relation Age of Onset  . Hypertension Mother   .  Heart disease Mother   . Hypertension Father   . Heart disease Father   . Heart attack Father   . Prostate cancer Brother   . Heart attack Brother   . Heart disease Brother     Social History Social History   Tobacco Use  . Smoking status: Former Smoker    Packs/day: 1.00    Years: 20.00    Pack years: 20.00    Last attempt to quit: 05/23/2011    Years since quitting: 6.2  . Smokeless tobacco: Never Used  Substance Use Topics  . Alcohol use: No    Comment: occassional  . Drug use: No     Allergies   Kiwi extract and Pineapple   Review of Systems Review of Systems  Constitutional: Negative for chills and fever.  Respiratory: Negative for shortness of breath.   Cardiovascular: Negative for chest pain.  Gastrointestinal: Positive for abdominal pain, blood in stool, constipation and diarrhea. Negative for nausea and  vomiting.  Genitourinary: Negative for dysuria and hematuria.  All other systems reviewed and are negative.    Physical Exam Updated Vital Signs BP (!) 163/97 (BP Location: Right Arm)   Pulse 85   Temp 98.1 F (36.7 C) (Oral)   Resp 18   Ht 5\' 6"  (1.676 m)   Wt 116.6 kg (257 lb)   SpO2 99%   BMI 41.48 kg/m   Physical Exam  Constitutional: She is oriented to person, place, and time. She appears well-developed and well-nourished. No distress.  Calm and cooperative. NAD. Obese  HENT:  Head: Normocephalic and atraumatic.  Eyes: Pupils are equal, round, and reactive to light. Conjunctivae are normal. Right eye exhibits no discharge. Left eye exhibits no discharge. No scleral icterus.  Neck: Normal range of motion.  Cardiovascular: Normal rate and regular rhythm.  Pulmonary/Chest: Effort normal and breath sounds normal. No respiratory distress.  Abdominal: Soft. Bowel sounds are normal. She exhibits no distension and no mass. There is no tenderness. There is no rebound and no guarding. No hernia.  No significant tenderness however abdominal exam is limited by obesity. Prior surgical scar from hysterectomy  Genitourinary:  Genitourinary Comments: Rectal: There are non-thrombosed external hemorrhoids. No gross blood, fissures, redness, area of fluctuance, lesions, or tenderness. Chaperone present during exam.   Neurological: She is alert and oriented to person, place, and time.  Skin: Skin is warm and dry.  Psychiatric: She has a normal mood and affect. Her behavior is normal.  Nursing note and vitals reviewed.    ED Treatments / Results  Labs (all labs ordered are listed, but only abnormal results are displayed) Labs Reviewed  COMPREHENSIVE METABOLIC PANEL - Abnormal; Notable for the following components:      Result Value   Glucose, Bld 139 (*)    Creatinine, Ser 1.06 (*)    GFR calc non Af Amer 52 (*)    All other components within normal limits  CBC WITH  DIFFERENTIAL/PLATELET  POC OCCULT BLOOD, ED    EKG None  Radiology No results found.  Procedures Procedures (including critical care time)  Medications Ordered in ED Medications - No data to display   Initial Impression / Assessment and Plan / ED Course  I have reviewed the triage vital signs and the nursing notes.  Pertinent labs & imaging results that were available during my care of the patient were reviewed by me and considered in my medical decision making (see chart for details).  72 year old female who  presents with acute hematochezia. She is hypertensive but otherwise vitals are normal. She had three episodes of BRBPR with diarrhea which concerns her. Her abdomen is soft, benign. CBC is normal. CMP is unremarkable. Hemoccult was negative. Shared visit with Dr. Eulis Foster. She has seen GI in the past for her colonoscopies. We will start her on Colace and have her f/u with GI  Final Clinical Impressions(s) / ED Diagnoses   Final diagnoses:  Rectal bleeding    ED Discharge Orders    None       Recardo Evangelist, PA-C 08/12/17 1026    Daleen Bo, MD 08/13/17 [phone]

## 2017-08-12 NOTE — Discharge Instructions (Signed)
Please take colace (stool softener) Follow up with GI Return if you are worsening

## 2017-08-12 NOTE — ED Notes (Signed)
Pt stable, ambulatory, and verbalizes understanding of d/c instructions.  

## 2017-08-12 NOTE — ED Notes (Signed)
Report taken from off going RN - Assumed care of pt at this time

## 2017-08-12 NOTE — ED Provider Notes (Signed)
  Face-to-face evaluation   History: Alert elderly female, complaining of abdominal cramping with large volume blood in stool this morning with diarrhea.  Physical exam: Elderly obese female.  Abdomen soft and nontender to palpation.  She is lucid.  She is nontoxic.  Medical screening examination/treatment/procedure(s) were conducted as a shared visit with non-physician practitioner(s) and myself.  I personally evaluated the patient during the encounter    Daleen Bo, MD 08/13/17 [phone]

## 2017-08-12 NOTE — ED Triage Notes (Signed)
Patient c/o bright red blood "like water in the bowl". Denies hx of GI bleed.

## 2017-09-01 DIAGNOSIS — I471 Supraventricular tachycardia: Secondary | ICD-10-CM | POA: Diagnosis not present

## 2017-09-01 DIAGNOSIS — R7303 Prediabetes: Secondary | ICD-10-CM | POA: Diagnosis not present

## 2017-09-01 DIAGNOSIS — K921 Melena: Secondary | ICD-10-CM | POA: Diagnosis not present

## 2017-09-01 DIAGNOSIS — Z86010 Personal history of colonic polyps: Secondary | ICD-10-CM | POA: Diagnosis not present

## 2017-10-21 DIAGNOSIS — Z23 Encounter for immunization: Secondary | ICD-10-CM | POA: Diagnosis not present

## 2017-10-29 ENCOUNTER — Other Ambulatory Visit: Payer: Self-pay

## 2017-10-29 NOTE — Patient Outreach (Signed)
[facility]) Care Management  10/29/2017  [name] [birth year] [id number]   Medication Adherence call to [name] spoke with patient she is due on Atorvastatin 10 mg she explain she only takes it as needed because she is concern about liver damage and doctor is aware of it. patient is due on Atorvastatin under [place].   [facility] Management Direct Dial [phone]  Fax [phone] [email or web address]

## 2018-01-27 ENCOUNTER — Other Ambulatory Visit: Payer: Self-pay

## 2018-01-27 NOTE — Patient Outreach (Signed)
[facility]) Care Management  01/27/2018  [name] May 23, 1945 [id number]   Medication Adherence call to [name] left a message for patient to call back patient is due on Atorvastatin 10 mg .  [facility] said patient pick up this medication yesterday for a 90 days supply. [name] is showing past due under [place].    [facility] Management Direct Dial [phone]  Fax [phone] [email or web address]

## 2018-01-29 DIAGNOSIS — I10 Essential (primary) hypertension: Secondary | ICD-10-CM | POA: Diagnosis not present

## 2018-01-29 DIAGNOSIS — Z79899 Other long term (current) drug therapy: Secondary | ICD-10-CM | POA: Diagnosis not present

## 2018-01-29 DIAGNOSIS — K219 Gastro-esophageal reflux disease without esophagitis: Secondary | ICD-10-CM | POA: Diagnosis not present

## 2018-04-16 ENCOUNTER — Telehealth (INDEPENDENT_AMBULATORY_CARE_PROVIDER_SITE_OTHER): Payer: Self-pay | Admitting: Orthopaedic Surgery

## 2018-04-16 NOTE — Telephone Encounter (Signed)
Please advise. Thanks.  

## 2018-04-16 NOTE — Telephone Encounter (Signed)
IC advised  She scheduled appt to see Dr Ninfa Linden

## 2018-04-16 NOTE — Telephone Encounter (Signed)
Patient called asked if Dr Ninfa Linden will prescribe her something for pain. The number to contact patient is [phone]

## 2018-04-16 NOTE — Telephone Encounter (Signed)
I actually can not prescribe a narcotic pain medication since I have not seen her in a year.

## 2018-04-20 ENCOUNTER — Ambulatory Visit (INDEPENDENT_AMBULATORY_CARE_PROVIDER_SITE_OTHER): Payer: Medicare Other | Admitting: Orthopaedic Surgery

## 2018-04-20 ENCOUNTER — Encounter (INDEPENDENT_AMBULATORY_CARE_PROVIDER_SITE_OTHER): Payer: Self-pay | Admitting: Orthopaedic Surgery

## 2018-04-20 ENCOUNTER — Ambulatory Visit (INDEPENDENT_AMBULATORY_CARE_PROVIDER_SITE_OTHER): Payer: Medicare Other

## 2018-04-20 VITALS — Ht 65.5 in | Wt 260.0 lb

## 2018-04-20 DIAGNOSIS — G8929 Other chronic pain: Secondary | ICD-10-CM

## 2018-04-20 DIAGNOSIS — M79642 Pain in left hand: Secondary | ICD-10-CM

## 2018-04-20 DIAGNOSIS — M25562 Pain in left knee: Secondary | ICD-10-CM

## 2018-04-20 DIAGNOSIS — M1712 Unilateral primary osteoarthritis, left knee: Secondary | ICD-10-CM

## 2018-04-20 MED ORDER — ACETAMINOPHEN-CODEINE #3 300-30 MG PO TABS: 1.0000 | ORAL_TABLET | Freq: Three times a day (TID) | ORAL | 0 refills | Status: DC | PRN

## 2018-04-20 MED ORDER — METHYLPREDNISOLONE 4 MG PO TABS: ORAL_TABLET | ORAL | 0 refills | Status: DC

## 2018-04-20 MED ORDER — DICLOFENAC SODIUM 1 % TD GEL: 2.0000 g | Freq: Four times a day (QID) | TRANSDERMAL | 3 refills | Status: DC

## 2018-04-20 NOTE — Progress Notes (Signed)
Office Visit Note   Patient: [name]           Date of Birth: [birth year]           MRN: [id number] Visit Date: 04/20/2018              Requested by: Cloyd Stagers, MD [address], [place] [address] PCP: Kelton Pillar, Melanie Crazier, MD   Assessment & Plan: Visit Diagnoses:  1. Pain in left hand   2. Chronic pain of left knee   3. Unilateral primary osteoarthritis, left knee     Plan: To treat her acute pain and wonder try Tylenol 3 as well as a steroid Dosepak and consider Voltaren gel if her pharmacy will cover this.  Also place a steroid injection in her left knee to see if this will help decrease her acute pain.  All question concerns were answered and addressed.  We can certainly see her back in 3 weeks to make sure she is doing well and at least for that visit can can determine whether or not to continue pain medications.  She understands that we would not go up on anything stronger because narcotics are not used to treat severe osteoarthritis pain.  Follow-Up Instructions: Return in about 3 weeks (around 05/11/2018).   Orders:  Orders Placed This Encounter  Procedures  . XR Hand Complete Left   Meds ordered this encounter  Medications  . methylPREDNISolone (MEDROL) 4 MG tablet    Sig: Medrol dose pack. Take as instructed    Dispense:  21 tablet    Refill:  0  . acetaminophen-codeine (TYLENOL #3) 300-30 MG tablet    Sig: Take 1-2 tablets by mouth every 8 (eight) hours as needed for moderate pain.    Dispense:  35 tablet    Refill:  0  . diclofenac sodium (VOLTAREN) 1 % GEL    Sig: Apply 2 g topically 4 (four) times daily.    Dispense:  100 g    Refill:  3      Procedures: No procedures performed   Clinical Data: No additional findings.   Subjective: Chief Complaint  Patient presents with  . Left Knee - Follow-up  . Left Hand - Pain  Patient is a very pleasant 73 year old female but I have not seen in a year.  She  recently called requesting pain medicine due to acute flareup of left knee pain.  She has known severe end-stage arthritis in her knees and has been moving recently when she is had a sudden increase in her pain.  She is someone who is morbidly obese with her last BMI measured at almost 43.  She does have some heart issues as well.  She is had bilateral leg edema.  The medial aspect of her knee hurts her quite a bit.  She is also experienced some left hand swelling and pain for last few months with no known injury.  She points the base of her thumb as a source of her pain but even her index and middle fingers at the MCP joints are swollen and painful.  She cannot take anti-inflammatories due to heart condition.  HPI  Review of Systems She currently denies any headache, chest pain, shortness of breath, fever, chills, nausea, vomiting  Objective: Vital Signs: Ht 5' 5.5" (1.664 m)   Wt 260 lb (117.9 kg)   BMI 42.61 kg/m   Physical Exam She is alert and orient x3 in no acute  distress Ortho Exam Examination of her left hand does show swelling at the second and third MCP joints but pain with grinding at the base of the thumb.  She is neurovascular intact.  Examination of both knees shows varus malalignment and significant medial joint line tenderness with good range of motion overall but very painful. Specialty Comments:  No specialty comments available.  Imaging: Xr Hand Complete Left  Result Date: 04/20/2018 3 views of the left hand show basilar thumb joint arthritis and soft tissue swelling.  There is a large bone spur near the first webspace.  X-rays from a year ago of her knee show severe end-stage arthritis of both knees with almost complete loss the medial joint space bilaterally and varus malalignment.  PMFS History: Patient Active Problem List   Diagnosis Date Noted  . Chest pain, rule out acute myocardial infarction 04/20/2017  . Chronic pain of left knee 04/15/2017  . Unilateral  primary osteoarthritis, left knee 04/15/2017  . Hypertension   . Chest pain with low risk for cardiac etiology 06/17/2014  . Degenerative arthritis of hip 04/04/2011   Past Medical History:  Diagnosis Date  . Arthritis   . Hyperlipidemia   . Hypertension   . Sleep apnea    possibly  . Spinal stenosis     Family History  Problem Relation Age of Onset  . Hypertension Mother   . Heart disease Mother   . Hypertension Father   . Heart disease Father   . Heart attack Father   . Prostate cancer Brother   . Heart attack Brother   . Heart disease Brother     Past Surgical History:  Procedure Laterality Date  . ABDOMINAL HYSTERECTOMY  1970's  . ABDOMINAL HYSTERECTOMY    . APPENDECTOMY  1970's   with hysterectomy  . APPENDECTOMY     with hysterectomy  . COLONOSCOPY    . COLONOSCOPY WITH PROPOFOL N/A 10/04/2013   Procedure: COLONOSCOPY WITH PROPOFOL;  Surgeon: Garlan Fair, MD;  Location: [facility];  Service: Endoscopy;  Laterality: N/A;  . OOPHORECTOMY    . TOTAL HIP ARTHROPLASTY  04/04/2011   Procedure: TOTAL HIP ARTHROPLASTY ANTERIOR APPROACH;  Surgeon: Mcarthur Rossetti, MD;  Location: [facility];  Service: Orthopedics;  Laterality: Right;  . TOTAL HIP ARTHROPLASTY     Social History   Occupational History  . Not on file  Tobacco Use  . Smoking status: Former Smoker    Packs/day: 1.00    Years: 20.00    Pack years: 20.00    Last attempt to quit: 05/23/2011    Years since quitting: 6.9  . Smokeless tobacco: Never Used  Substance and Sexual Activity  . Alcohol use: No    Comment: occassional  . Drug use: No  . Sexual activity: Not on file

## 2018-05-11 ENCOUNTER — Ambulatory Visit (INDEPENDENT_AMBULATORY_CARE_PROVIDER_SITE_OTHER): Payer: Medicare Other | Admitting: Orthopaedic Surgery

## 2018-08-05 DIAGNOSIS — I10 Essential (primary) hypertension: Secondary | ICD-10-CM | POA: Diagnosis not present

## 2018-08-05 DIAGNOSIS — E7800 Pure hypercholesterolemia, unspecified: Secondary | ICD-10-CM | POA: Diagnosis not present

## 2018-08-05 DIAGNOSIS — E785 Hyperlipidemia, unspecified: Secondary | ICD-10-CM | POA: Diagnosis not present

## 2018-09-08 DIAGNOSIS — Z1389 Encounter for screening for other disorder: Secondary | ICD-10-CM | POA: Diagnosis not present

## 2018-09-08 DIAGNOSIS — R7309 Other abnormal glucose: Secondary | ICD-10-CM | POA: Diagnosis not present

## 2018-09-08 DIAGNOSIS — E7800 Pure hypercholesterolemia, unspecified: Secondary | ICD-10-CM | POA: Diagnosis not present

## 2018-09-08 DIAGNOSIS — I10 Essential (primary) hypertension: Secondary | ICD-10-CM | POA: Diagnosis not present

## 2018-09-08 DIAGNOSIS — Z0000 Encounter for general adult medical examination without abnormal findings: Secondary | ICD-10-CM | POA: Diagnosis not present

## 2018-09-08 DIAGNOSIS — R600 Localized edema: Secondary | ICD-10-CM | POA: Diagnosis not present

## 2018-09-08 DIAGNOSIS — E785 Hyperlipidemia, unspecified: Secondary | ICD-10-CM | POA: Diagnosis not present

## 2018-09-08 DIAGNOSIS — Z79899 Other long term (current) drug therapy: Secondary | ICD-10-CM | POA: Diagnosis not present

## 2018-09-27 DIAGNOSIS — Z86010 Personal history of colonic polyps: Secondary | ICD-10-CM | POA: Diagnosis not present

## 2018-09-28 DIAGNOSIS — G4733 Obstructive sleep apnea (adult) (pediatric): Secondary | ICD-10-CM | POA: Diagnosis not present

## 2018-09-29 DIAGNOSIS — G4733 Obstructive sleep apnea (adult) (pediatric): Secondary | ICD-10-CM | POA: Diagnosis not present

## 2018-10-08 DIAGNOSIS — G4733 Obstructive sleep apnea (adult) (pediatric): Secondary | ICD-10-CM | POA: Diagnosis not present

## 2018-10-13 ENCOUNTER — Other Ambulatory Visit: Payer: Self-pay | Admitting: Family Medicine

## 2018-10-13 ENCOUNTER — Other Ambulatory Visit: Payer: Self-pay | Admitting: Geriatric Medicine

## 2018-10-13 DIAGNOSIS — Z1231 Encounter for screening mammogram for malignant neoplasm of breast: Secondary | ICD-10-CM

## 2018-10-20 DIAGNOSIS — I10 Essential (primary) hypertension: Secondary | ICD-10-CM | POA: Diagnosis not present

## 2018-10-20 DIAGNOSIS — Z23 Encounter for immunization: Secondary | ICD-10-CM | POA: Diagnosis not present

## 2018-10-20 DIAGNOSIS — R002 Palpitations: Secondary | ICD-10-CM | POA: Diagnosis not present

## 2018-10-20 DIAGNOSIS — Z79899 Other long term (current) drug therapy: Secondary | ICD-10-CM | POA: Diagnosis not present

## 2018-11-01 DIAGNOSIS — Z01812 Encounter for preprocedural laboratory examination: Secondary | ICD-10-CM | POA: Diagnosis not present

## 2018-11-04 DIAGNOSIS — K5730 Diverticulosis of large intestine without perforation or abscess without bleeding: Secondary | ICD-10-CM | POA: Diagnosis not present

## 2018-11-04 DIAGNOSIS — K621 Rectal polyp: Secondary | ICD-10-CM | POA: Diagnosis not present

## 2018-11-04 DIAGNOSIS — K640 First degree hemorrhoids: Secondary | ICD-10-CM | POA: Diagnosis not present

## 2018-11-04 DIAGNOSIS — Z86010 Personal history of colonic polyps: Secondary | ICD-10-CM | POA: Diagnosis not present

## 2018-11-04 DIAGNOSIS — K5520 Angiodysplasia of colon without hemorrhage: Secondary | ICD-10-CM | POA: Diagnosis not present

## 2018-11-08 DIAGNOSIS — G4733 Obstructive sleep apnea (adult) (pediatric): Secondary | ICD-10-CM | POA: Diagnosis not present

## 2018-11-09 DIAGNOSIS — K621 Rectal polyp: Secondary | ICD-10-CM | POA: Diagnosis not present

## 2018-11-22 DIAGNOSIS — I10 Essential (primary) hypertension: Secondary | ICD-10-CM | POA: Diagnosis not present

## 2018-11-22 DIAGNOSIS — E785 Hyperlipidemia, unspecified: Secondary | ICD-10-CM | POA: Diagnosis not present

## 2018-11-22 DIAGNOSIS — E7800 Pure hypercholesterolemia, unspecified: Secondary | ICD-10-CM | POA: Diagnosis not present

## 2018-11-29 ENCOUNTER — Other Ambulatory Visit: Payer: Self-pay

## 2018-11-29 ENCOUNTER — Ambulatory Visit
Admission: RE | Admit: 2018-11-29 | Discharge: 2018-11-29 | Disposition: A | Discharge status: Home / Self Care | Payer: Medicare Other | Source: Ambulatory Visit | Attending: Geriatric Medicine | Admitting: Geriatric Medicine

## 2018-11-29 DIAGNOSIS — Z1231 Encounter for screening mammogram for malignant neoplasm of breast: Secondary | ICD-10-CM | POA: Diagnosis not present

## 2018-11-30 ENCOUNTER — Other Ambulatory Visit: Payer: Self-pay | Admitting: Geriatric Medicine

## 2018-11-30 DIAGNOSIS — R928 Other abnormal and inconclusive findings on diagnostic imaging of breast: Secondary | ICD-10-CM

## 2018-12-03 ENCOUNTER — Ambulatory Visit
Admission: RE | Admit: 2018-12-03 | Discharge: 2018-12-03 | Disposition: A | Discharge status: Home / Self Care | Payer: Medicare Other | Source: Ambulatory Visit | Attending: Geriatric Medicine | Admitting: Geriatric Medicine

## 2018-12-03 ENCOUNTER — Other Ambulatory Visit: Payer: Self-pay

## 2018-12-03 DIAGNOSIS — R928 Other abnormal and inconclusive findings on diagnostic imaging of breast: Secondary | ICD-10-CM

## 2018-12-03 DIAGNOSIS — D241 Benign neoplasm of right breast: Secondary | ICD-10-CM | POA: Diagnosis not present

## 2018-12-08 DIAGNOSIS — G4733 Obstructive sleep apnea (adult) (pediatric): Secondary | ICD-10-CM | POA: Diagnosis not present

## 2019-01-03 DIAGNOSIS — G4733 Obstructive sleep apnea (adult) (pediatric): Secondary | ICD-10-CM | POA: Diagnosis not present

## 2019-01-04 DIAGNOSIS — E785 Hyperlipidemia, unspecified: Secondary | ICD-10-CM | POA: Diagnosis not present

## 2019-01-04 DIAGNOSIS — I10 Essential (primary) hypertension: Secondary | ICD-10-CM | POA: Diagnosis not present

## 2019-01-04 DIAGNOSIS — E7800 Pure hypercholesterolemia, unspecified: Secondary | ICD-10-CM | POA: Diagnosis not present

## 2019-01-19 DIAGNOSIS — I10 Essential (primary) hypertension: Secondary | ICD-10-CM | POA: Diagnosis not present

## 2019-01-19 DIAGNOSIS — D229 Melanocytic nevi, unspecified: Secondary | ICD-10-CM | POA: Diagnosis not present

## 2019-01-25 IMAGING — DX DG CHEST 2V
2 series · 2 of 2 positions shown · non-contrast
Comparison: Chest radiograph dated 06/17/2014

CLINICAL DATA: 71-year-old female with chest pain.

EXAM:
CHEST - 2 VIEW

[chest pa]
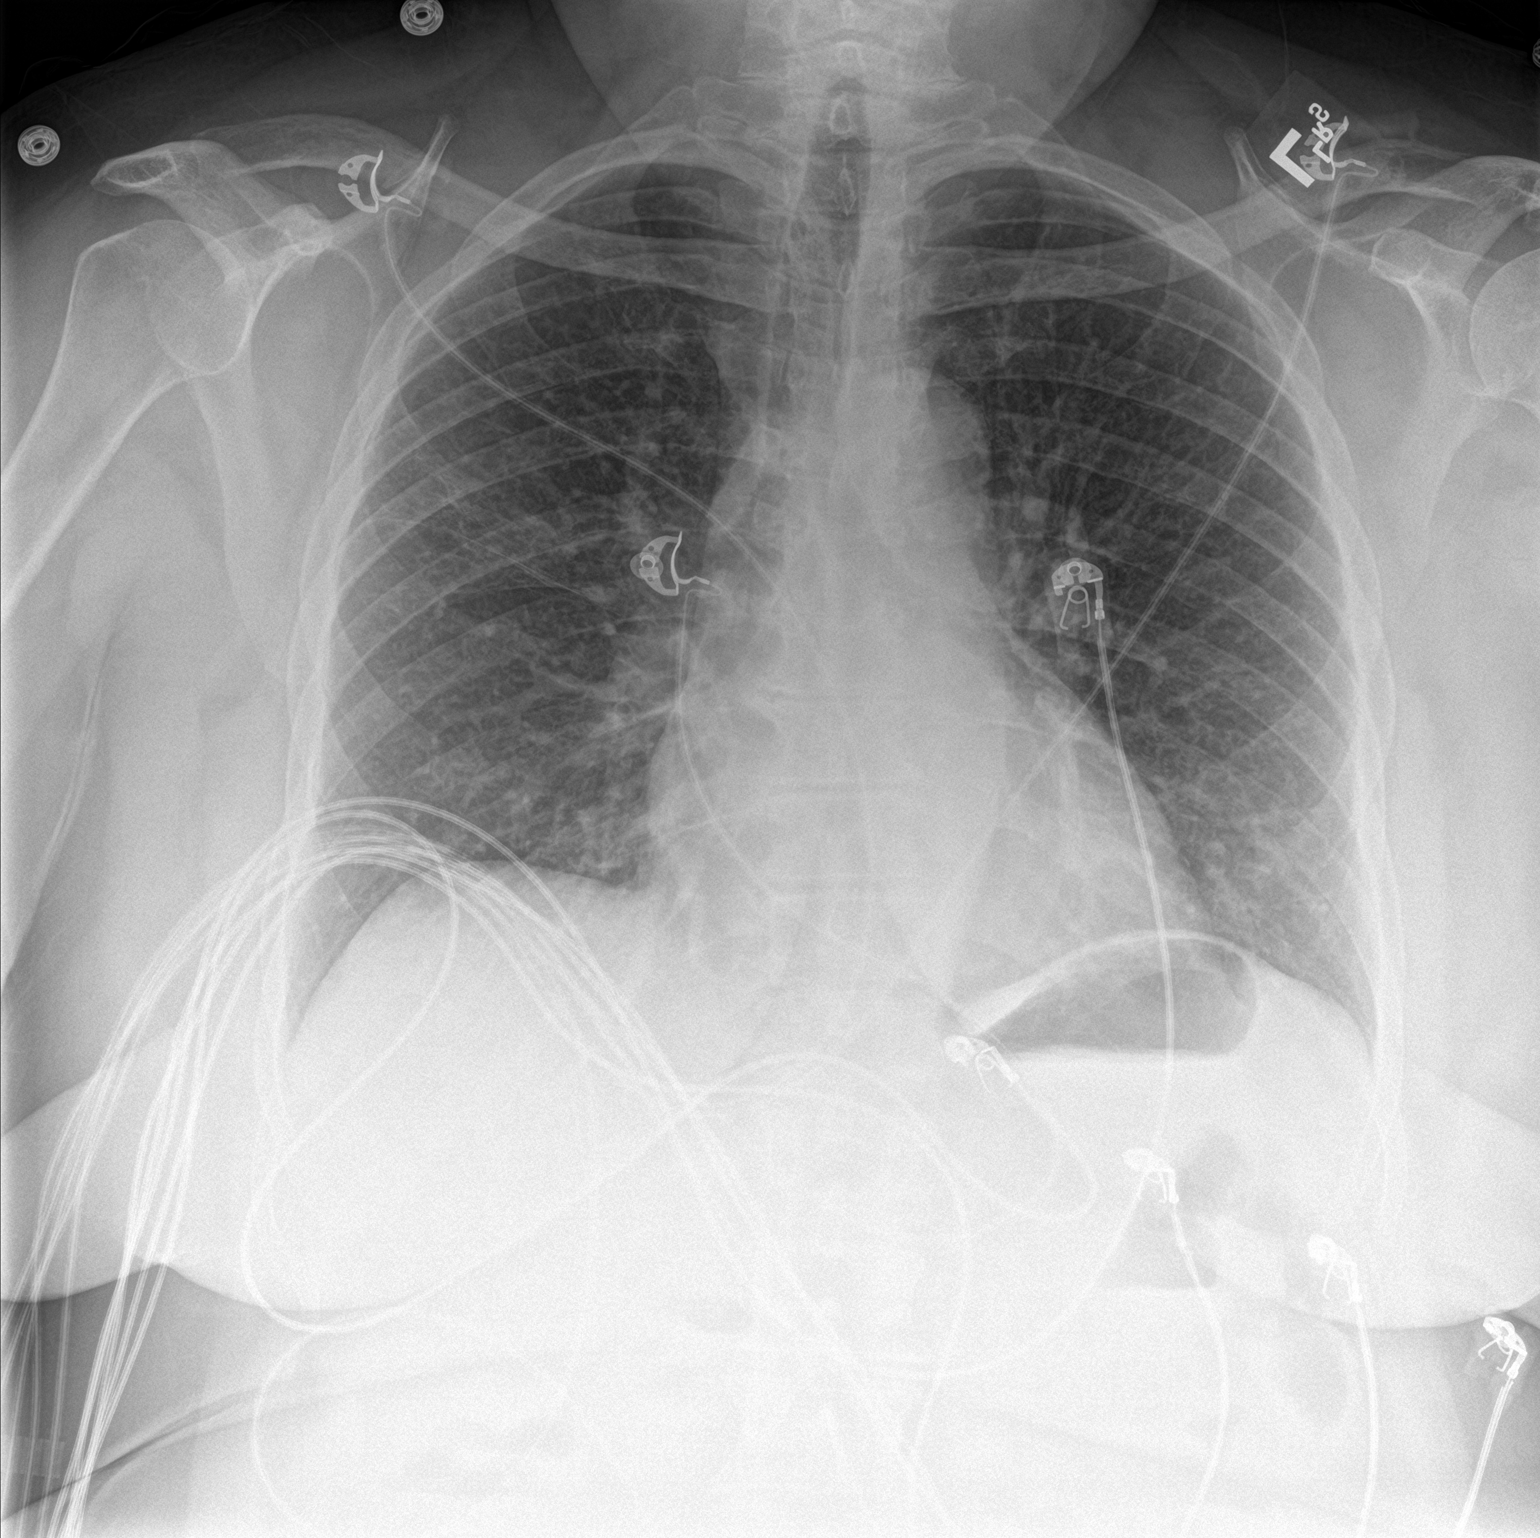

[chest lat]
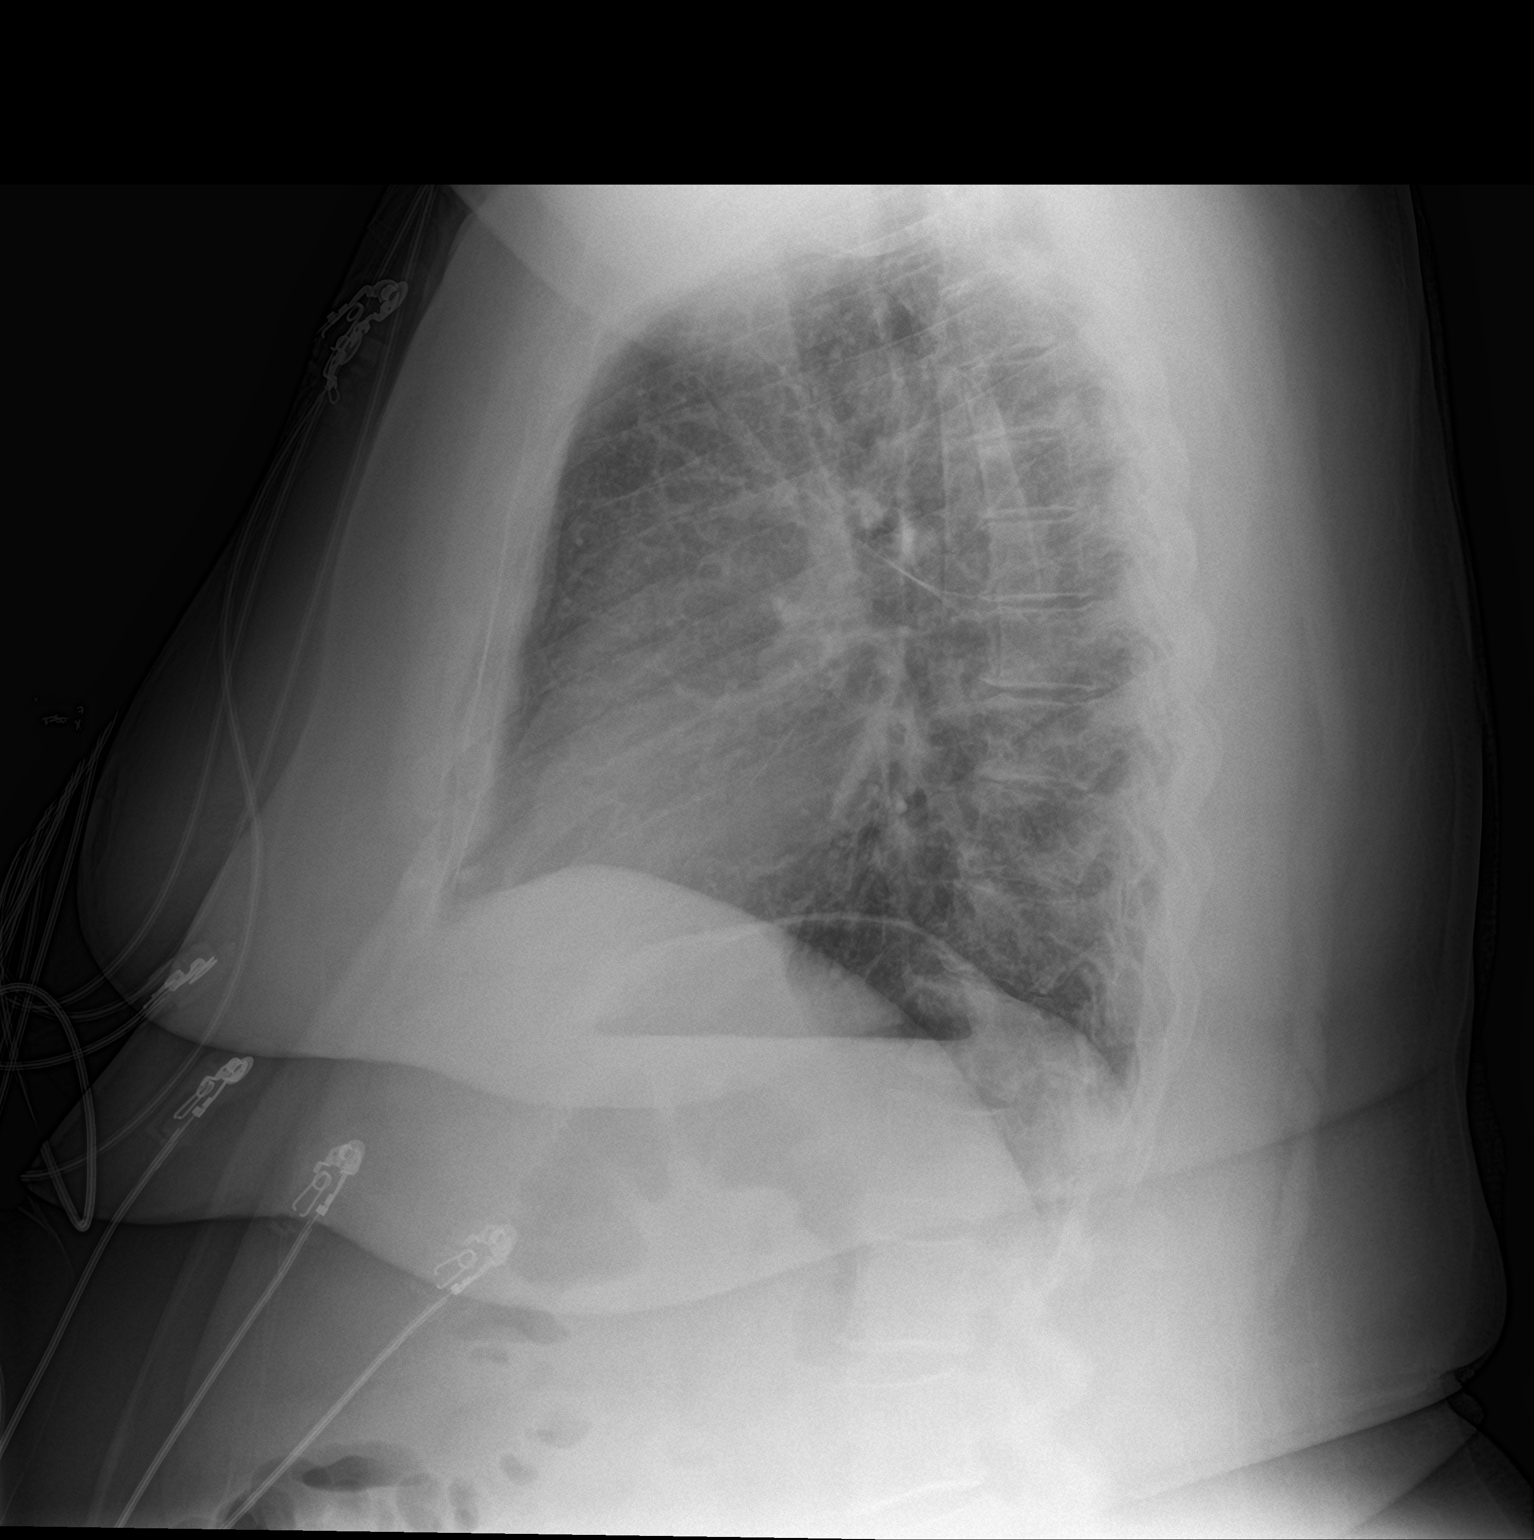

[2 of 2 positions shown; findings below may reference images not displayed]

FINDINGS: The heart size and mediastinal contours are within normal limits.
Both lungs are clear. The visualized skeletal structures are
unremarkable.
IMPRESSION: No active cardiopulmonary disease.

## 2019-02-24 DIAGNOSIS — E785 Hyperlipidemia, unspecified: Secondary | ICD-10-CM | POA: Diagnosis not present

## 2019-02-24 DIAGNOSIS — I10 Essential (primary) hypertension: Secondary | ICD-10-CM | POA: Diagnosis not present

## 2019-02-24 DIAGNOSIS — E7800 Pure hypercholesterolemia, unspecified: Secondary | ICD-10-CM | POA: Diagnosis not present

## 2019-03-29 DIAGNOSIS — G4733 Obstructive sleep apnea (adult) (pediatric): Secondary | ICD-10-CM | POA: Diagnosis not present

## 2019-05-10 DIAGNOSIS — E785 Hyperlipidemia, unspecified: Secondary | ICD-10-CM | POA: Diagnosis not present

## 2019-05-10 DIAGNOSIS — E7800 Pure hypercholesterolemia, unspecified: Secondary | ICD-10-CM | POA: Diagnosis not present

## 2019-05-10 DIAGNOSIS — I10 Essential (primary) hypertension: Secondary | ICD-10-CM | POA: Diagnosis not present

## 2019-06-14 DIAGNOSIS — Z79899 Other long term (current) drug therapy: Secondary | ICD-10-CM | POA: Diagnosis not present

## 2019-06-14 DIAGNOSIS — I10 Essential (primary) hypertension: Secondary | ICD-10-CM | POA: Diagnosis not present

## 2019-07-07 DIAGNOSIS — I10 Essential (primary) hypertension: Secondary | ICD-10-CM | POA: Diagnosis not present

## 2019-07-07 DIAGNOSIS — E785 Hyperlipidemia, unspecified: Secondary | ICD-10-CM | POA: Diagnosis not present

## 2019-07-07 DIAGNOSIS — E7800 Pure hypercholesterolemia, unspecified: Secondary | ICD-10-CM | POA: Diagnosis not present

## 2019-08-17 ENCOUNTER — Encounter: Payer: Self-pay | Admitting: Orthopaedic Surgery

## 2019-08-17 ENCOUNTER — Telehealth: Payer: Self-pay | Admitting: Orthopaedic Surgery

## 2019-08-17 ENCOUNTER — Ambulatory Visit: Payer: Self-pay

## 2019-08-17 ENCOUNTER — Ambulatory Visit: Payer: Medicare Other | Admitting: Orthopaedic Surgery

## 2019-08-17 ENCOUNTER — Other Ambulatory Visit: Payer: Self-pay | Admitting: Orthopaedic Surgery

## 2019-08-17 ENCOUNTER — Other Ambulatory Visit: Payer: Self-pay

## 2019-08-17 VITALS — Ht 64.17 in | Wt 259.6 lb

## 2019-08-17 DIAGNOSIS — G8929 Other chronic pain: Secondary | ICD-10-CM

## 2019-08-17 DIAGNOSIS — M25562 Pain in left knee: Secondary | ICD-10-CM | POA: Diagnosis not present

## 2019-08-17 DIAGNOSIS — M1712 Unilateral primary osteoarthritis, left knee: Secondary | ICD-10-CM

## 2019-08-17 MED ORDER — METHYLPREDNISOLONE ACETATE 40 MG/ML IJ SUSP
40.0000 mg | INTRAMUSCULAR | Status: AC | PRN
  Administered 2019-08-17: 40 mg via INTRA_ARTICULAR

## 2019-08-17 MED ORDER — TRAMADOL HCL 50 MG PO TABS
50.0000 mg | ORAL_TABLET | Freq: Four times a day (QID) | ORAL | 0 refills | Status: DC | PRN
Start: 2019-08-17 — End: 2019-12-12

## 2019-08-17 MED ORDER — LIDOCAINE HCL 1 % IJ SOLN
3.0000 mL | INTRAMUSCULAR | Status: AC | PRN
  Administered 2019-08-17: 3 mL

## 2019-08-17 NOTE — Telephone Encounter (Signed)
I sent in some tramadol to her pharmacy.

## 2019-08-17 NOTE — Progress Notes (Signed)
Office Visit Note   Patient: [name]           Date of Birth: 08-Mar-1945           MRN: [id number] Visit Date: 08/17/2019              Requested by: Cloyd Stagers, MD [address],  [place] [address] PCP: Kelton Pillar, Melanie Crazier, MD   Assessment & Plan: Visit Diagnoses:  1. Chronic pain of left knee   2. Unilateral primary osteoarthritis, left knee     Plan: I did place a steroid injection in her left knee today.  She understands that as of now she is not a surgical candidate given her weight and have counseled extensively about this.  I would like to see her back in 3 months for repeat weight and BMI calculation.  She understands that we cannot proceed with surgery until BMI would be under 40 we would still need medical/cardiac clearance. The patient meets the AMA guidelines for Morbid (severe) obesity with a BMI > 40.0 and I have recommended weight loss.  Follow-Up Instructions: Return in about 3 months (around 11/17/2019).   Orders:  Orders Placed This Encounter  Procedures  . Large Joint Inj  . XR Knee 1-2 Views Left   No orders of the defined types were placed in this encounter.     Procedures: Large Joint Inj: R knee on 08/17/2019 9:25 AM Indications: diagnostic evaluation and pain Details: 22 G 1.5 in needle, superolateral approach  Arthrogram: No  Medications: 3 mL lidocaine 1 %; 40 mg methylPREDNISolone acetate 40 MG/ML Outcome: tolerated well, no immediate complications Procedure, treatment alternatives, risks and benefits explained, specific risks discussed. Consent was given by the patient. Immediately prior to procedure a time out was called to verify the correct patient, procedure, equipment, support staff and site/side marked as required. Patient was prepped and draped in the usual sterile fashion.       Clinical Data: No additional findings.   Subjective: Chief Complaint  Patient presents with  . Left Knee  - Pain  The patient comes in today with continued left knee pain.  The right knee does not really bother her.  We have seen her before for this knee and diagnosed her with severe end-stage arthritis.  She has been dealing with sleep apnea as well as some type of heart block.  She is on blood thinning medications and is now diabetic.  Her right knee pain is quite severe and has become daily.  She is walking with it significant limp to her gait.  It is affecting her balance and pronation and her ankles are hurting because of it.  She does have a BMI of 44.32.  She has had no other acute change in her medical status.  HPI  Review of Systems Today she denies any headache, chest pain, shortness of breath, fever, chills, nausea, vomiting  Objective: Vital Signs: Ht 5' 4.17" (1.63 m)   Wt 259 lb 9.6 oz (117.8 kg)   BMI 44.32 kg/m   Physical Exam She is alert and oriented x3 and in no acute distress Ortho Exam Examination of her left knee shows varus malalignment.  She has a small flexion contracture of the knee and pain with flexion extension. Specialty Comments:  No specialty comments available.  Imaging: XR Knee 1-2 Views Left  Result Date: 08/17/2019 2 views of the left knee show severe end-stage arthritis with varus malalignment.  The  medial joint space is completely collapsed with sclerotic changes and obvious periarticular osteophytes.  The patellofemoral joint is also severely diseased with osteoarthritis.    PMFS History: Patient Active Problem List   Diagnosis Date Noted  . Chest pain, rule out acute myocardial infarction 04/20/2017  . Chronic pain of left knee 04/15/2017  . Unilateral primary osteoarthritis, left knee 04/15/2017  . Hypertension   . Chest pain with low risk for cardiac etiology 06/17/2014  . Degenerative arthritis of hip 04/04/2011   Past Medical History:  Diagnosis Date  . Arthritis   . Hyperlipidemia   . Hypertension   . Sleep apnea    possibly  .  Spinal stenosis     Family History  Problem Relation Age of Onset  . Hypertension Mother   . Heart disease Mother   . Hypertension Father   . Heart disease Father   . Heart attack Father   . Prostate cancer Brother   . Heart attack Brother   . Heart disease Brother     Past Surgical History:  Procedure Laterality Date  . ABDOMINAL HYSTERECTOMY  1970's  . ABDOMINAL HYSTERECTOMY    . APPENDECTOMY  1970's   with hysterectomy  . APPENDECTOMY     with hysterectomy  . COLONOSCOPY    . COLONOSCOPY WITH PROPOFOL N/A 10/04/2013   Procedure: COLONOSCOPY WITH PROPOFOL;  Surgeon: Garlan Fair, MD;  Location: [facility];  Service: Endoscopy;  Laterality: N/A;  . OOPHORECTOMY    . TOTAL HIP ARTHROPLASTY  04/04/2011   Procedure: TOTAL HIP ARTHROPLASTY ANTERIOR APPROACH;  Surgeon: Mcarthur Rossetti, MD;  Location: [facility];  Service: Orthopedics;  Laterality: Right;  . TOTAL HIP ARTHROPLASTY     Social History   Occupational History  . Not on file  Tobacco Use  . Smoking status: Former Smoker    Packs/day: 1.00    Years: 20.00    Pack years: 20.00    Quit date: 05/23/2011    Years since quitting: 8.2  . Smokeless tobacco: Never Used  Substance and Sexual Activity  . Alcohol use: No    Comment: occassional  . Drug use: No  . Sexual activity: Not on file

## 2019-08-17 NOTE — Telephone Encounter (Signed)
Please advise 

## 2019-08-17 NOTE — Telephone Encounter (Signed)
Patient called requesting pain medication. Patient stated her appt was today and forgot to ask for meds for pain. Please give patient a call about this matter and send prescription to pharmacy on file. Patient phone number is [phone].

## 2019-09-05 DIAGNOSIS — I10 Essential (primary) hypertension: Secondary | ICD-10-CM | POA: Diagnosis not present

## 2019-09-05 DIAGNOSIS — E7800 Pure hypercholesterolemia, unspecified: Secondary | ICD-10-CM | POA: Diagnosis not present

## 2019-09-05 DIAGNOSIS — E785 Hyperlipidemia, unspecified: Secondary | ICD-10-CM | POA: Diagnosis not present

## 2019-10-03 ENCOUNTER — Telehealth: Payer: Self-pay | Admitting: Orthopaedic Surgery

## 2019-10-03 NOTE — Telephone Encounter (Signed)
Patient called. She would like to have a gel injection in her L knee. Says the pain is still there. Tramadol is not helping. Phone number is [phone]

## 2019-10-03 NOTE — Telephone Encounter (Signed)
Can we get this ordered for her

## 2019-10-04 ENCOUNTER — Telehealth: Payer: Self-pay

## 2019-10-04 NOTE — Telephone Encounter (Signed)
Submitted VOB, SynviscOne, left knee. 

## 2019-10-04 NOTE — Telephone Encounter (Signed)
Noted  

## 2019-10-05 DIAGNOSIS — E785 Hyperlipidemia, unspecified: Secondary | ICD-10-CM | POA: Diagnosis not present

## 2019-10-05 DIAGNOSIS — M179 Osteoarthritis of knee, unspecified: Secondary | ICD-10-CM | POA: Diagnosis not present

## 2019-10-05 DIAGNOSIS — E7800 Pure hypercholesterolemia, unspecified: Secondary | ICD-10-CM | POA: Diagnosis not present

## 2019-10-05 DIAGNOSIS — I10 Essential (primary) hypertension: Secondary | ICD-10-CM | POA: Diagnosis not present

## 2019-10-07 ENCOUNTER — Telehealth: Payer: Self-pay

## 2019-10-07 NOTE — Telephone Encounter (Signed)
Approved, SynviscOne, left knee. [place] Patient will be responsible for 20% OOP. Co-pay of $30.00 required No PA required  Appt. 10/11/2019

## 2019-10-11 ENCOUNTER — Encounter: Payer: Self-pay | Admitting: Orthopaedic Surgery

## 2019-10-11 ENCOUNTER — Ambulatory Visit: Payer: Medicare Other | Admitting: Orthopaedic Surgery

## 2019-10-11 DIAGNOSIS — M1712 Unilateral primary osteoarthritis, left knee: Secondary | ICD-10-CM

## 2019-10-11 MED ORDER — HYLAN G-F 20 48 MG/6ML IX SOSY
48.0000 mg | PREFILLED_SYRINGE | INTRA_ARTICULAR | Status: AC | PRN
  Administered 2019-10-11: 48 mg via INTRA_ARTICULAR

## 2019-10-11 NOTE — Progress Notes (Signed)
° °  Procedure Note  Patient: [name]             Date of Birth: 1946-01-08           MRN: [id number]             Visit Date: 10/11/2019 HPI: [name] returns today for Synvisc 1 injection left knee.  7 pain anterolateral aspect of the knee.  Stabbing at times.  States she is having constant pain and swelling.  She also has a form to fill out her handicap permit due to the fact that she has difficulty ambulating.  She is working on weight loss.  Stating tramadol with no real relief.  Physical exam: Left knee she has slight extension lag.  Full flexion.  No abnormal warmth erythema.  Slight effusion. Procedures: Visit Diagnoses:  1. Unilateral primary osteoarthritis, left knee     Large Joint Inj: L knee on 10/11/2019 11:26 AM Indications: pain Details: 22 G 1.5 in needle, superolateral approach  Arthrogram: No  Medications: 48 mg Hylan 48 MG/6ML Aspirate: 8 mL yellow Outcome: tolerated well, no immediate complications Procedure, treatment alternatives, risks and benefits explained, specific risks discussed. Consent was given by the patient. Immediately prior to procedure a time out was called to verify the correct patient, procedure, equipment, support staff and site/side marked as required. Patient was prepped and draped in the usual sterile fashion.    Plan we will see her back in 8 to 10 weeks to see what type of response she had to the injection.  Height weight at that time.  She understands that she needs to wait least 6 months between injections.  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

## 2019-10-20 ENCOUNTER — Ambulatory Visit (HOSPITAL_COMMUNITY)
Admission: EM | Admit: 2019-10-20 | Discharge: 2019-10-20 | Disposition: A | Discharge status: Home / Self Care | Payer: Medicare Other | Attending: Emergency Medicine | Admitting: Emergency Medicine

## 2019-10-20 ENCOUNTER — Other Ambulatory Visit: Payer: Self-pay

## 2019-10-20 ENCOUNTER — Encounter (HOSPITAL_COMMUNITY): Payer: Self-pay | Admitting: Emergency Medicine

## 2019-10-20 DIAGNOSIS — Z20822 Contact with and (suspected) exposure to covid-19: Secondary | ICD-10-CM | POA: Diagnosis not present

## 2019-10-20 LAB — SARS CORONAVIRUS 2 (TAT 6-24 HRS): SARS Coronavirus 2: NEGATIVE

## 2019-10-20 NOTE — ED Triage Notes (Signed)
Patient believes she may have been exposed to COVID, no symptoms.

## 2019-10-20 NOTE — Discharge Instructions (Signed)

## 2019-10-31 DIAGNOSIS — E7800 Pure hypercholesterolemia, unspecified: Secondary | ICD-10-CM | POA: Diagnosis not present

## 2019-10-31 DIAGNOSIS — I10 Essential (primary) hypertension: Secondary | ICD-10-CM | POA: Diagnosis not present

## 2019-11-17 ENCOUNTER — Encounter: Payer: Self-pay | Admitting: Orthopaedic Surgery

## 2019-11-17 ENCOUNTER — Ambulatory Visit (INDEPENDENT_AMBULATORY_CARE_PROVIDER_SITE_OTHER): Payer: Medicare Other | Admitting: Orthopaedic Surgery

## 2019-11-17 VITALS — Ht 64.0 in | Wt 257.0 lb

## 2019-11-17 DIAGNOSIS — G8929 Other chronic pain: Secondary | ICD-10-CM | POA: Diagnosis not present

## 2019-11-17 DIAGNOSIS — M1712 Unilateral primary osteoarthritis, left knee: Secondary | ICD-10-CM | POA: Diagnosis not present

## 2019-11-17 DIAGNOSIS — M25562 Pain in left knee: Secondary | ICD-10-CM | POA: Diagnosis not present

## 2019-11-17 MED ORDER — LIDOCAINE HCL 1 % IJ SOLN
3.0000 mL | INTRAMUSCULAR | Status: AC | PRN
  Administered 2019-11-17: 3 mL

## 2019-11-17 MED ORDER — METHYLPREDNISOLONE ACETATE 40 MG/ML IJ SUSP
40.0000 mg | INTRAMUSCULAR | Status: AC | PRN
  Administered 2019-11-17: 40 mg via INTRA_ARTICULAR

## 2019-11-17 NOTE — Progress Notes (Signed)
Office Visit Note   Patient: [name]           Date of Birth: 1945/10/20           MRN: [id number] Visit Date: 11/17/2019              Requested by: Cloyd Stagers, MD [place],  [place] [address] PCP: Lajean Manes, MD   Assessment & Plan: Visit Diagnoses:  1. Unilateral primary osteoarthritis, left knee   2. Chronic pain of left knee   3. Severe obesity (BMI >= 40) (HCC)     Plan: The patient does have severe end-stage arthritis in her left knee and I feel that she is a candidate for knee replacement surgery.  However, her surgery cannot be scheduled yet due to a BMI of 44.  If she can lose some weight and get her BMI below 40 I would feel very comfortable proceeding with her surgery.  I am already comfortable proceeding with surgery but her insurance stipulates that total joints cannot be done until someone has a BMI below 40.  She did request a steroid injection in her knee today and I agreed with this and place a steroid injection in the left knee.  She tolerated this well.  We will see her back in 3 months to see how she is doing overall and to consider hyaluronic acid injection to be ordered after that visit.  We will also see if we can get her referral to the bariatric weight loss clinic with Dr. Leafy Ro.  Follow-Up Instructions: Return in about 3 months (around 02/17/2020).   Orders:  Orders Placed This Encounter  Procedures  . Large Joint Inj   No orders of the defined types were placed in this encounter.     Procedures: Large Joint Inj: L knee on 11/17/2019 1:07 PM Indications: diagnostic evaluation and pain Details: 22 G 1.5 in needle, superolateral approach  Arthrogram: No  Medications: 3 mL lidocaine 1 %; 40 mg methylPREDNISolone acetate 40 MG/ML Outcome: tolerated well, no immediate complications Procedure, treatment alternatives, risks and benefits explained, specific risks discussed. Consent was given by the patient.  Immediately prior to procedure a time out was called to verify the correct patient, procedure, equipment, support staff and site/side marked as required. Patient was prepped and draped in the usual sterile fashion.       Clinical Data: No additional findings.   Subjective: No chief complaint on file. The patient is well-known to me.  She has debilitating end-stage arthritis of her left knee.  She had a hyaluronic acid injection in that knee a few months ago.  That has helped somewhat.  She is try to work on losing weight.  Today her BMI is 44.  That has not changed at all.  She states is mainly due to inactivity.  She denies any other acute change in medical status.  She is at least requesting steroid injection in her left knee today.  Her x-rays in the past showing severe end-stage arthritis with varus malalignment of the knee.  HPI  Review of Systems She currently denies any headache, chest pain, shortness of breath, fever, chills, nausea, vomiting  Objective: Vital Signs: Ht 5\' 4"  (1.626 m)   Wt 257 lb (116.6 kg)   BMI 44.11 kg/m   Physical Exam She is alert and orient x3 and in no acute distress Ortho Exam Examination of her left knee shows varus malalignment.  There is not  a large soft tissue envelope around the knee.  Her range of motion is painful throughout the arc of motion.  The knee is ligamentously stable but very painful with patellofemoral crepitation as well. Specialty Comments:  No specialty comments available.  Imaging: No results found.   PMFS History: Patient Active Problem List   Diagnosis Date Noted  . Severe obesity (BMI >= 40) ([facility]) 08/17/2019  . Chest pain, rule out acute myocardial infarction 04/20/2017  . Chronic pain of left knee 04/15/2017  . Unilateral primary osteoarthritis, left knee 04/15/2017  . Hypertension   . Chest pain with low risk for cardiac etiology 06/17/2014  . Degenerative arthritis of hip 04/04/2011   Past Medical History:    Diagnosis Date  . Arthritis   . Hyperlipidemia   . Hypertension   . Sleep apnea    possibly  . Spinal stenosis     Family History  Problem Relation Age of Onset  . Hypertension Mother   . Heart disease Mother   . Hypertension Father   . Heart disease Father   . Heart attack Father   . Prostate cancer Brother   . Heart attack Brother   . Heart disease Brother     Past Surgical History:  Procedure Laterality Date  . ABDOMINAL HYSTERECTOMY  1970's  . ABDOMINAL HYSTERECTOMY    . APPENDECTOMY  1970's   with hysterectomy  . APPENDECTOMY     with hysterectomy  . COLONOSCOPY    . COLONOSCOPY WITH PROPOFOL N/A 10/04/2013   Procedure: COLONOSCOPY WITH PROPOFOL;  Surgeon: Garlan Fair, MD;  Location: [facility];  Service: Endoscopy;  Laterality: N/A;  . OOPHORECTOMY    . TOTAL HIP ARTHROPLASTY  04/04/2011   Procedure: TOTAL HIP ARTHROPLASTY ANTERIOR APPROACH;  Surgeon: Mcarthur Rossetti, MD;  Location: [facility];  Service: Orthopedics;  Laterality: Right;  . TOTAL HIP ARTHROPLASTY     Social History   Occupational History  . Not on file  Tobacco Use  . Smoking status: Former Smoker    Packs/day: 1.00    Years: 20.00    Pack years: 20.00    Quit date: 05/23/2011    Years since quitting: 8.4  . Smokeless tobacco: Never Used  Substance and Sexual Activity  . Alcohol use: No    Comment: occassional  . Drug use: No  . Sexual activity: Not on file

## 2019-11-18 ENCOUNTER — Other Ambulatory Visit: Payer: Self-pay

## 2019-11-21 DIAGNOSIS — G4733 Obstructive sleep apnea (adult) (pediatric): Secondary | ICD-10-CM | POA: Diagnosis not present

## 2019-11-29 ENCOUNTER — Ambulatory Visit: Payer: Medicare Other | Admitting: Orthopaedic Surgery

## 2019-11-29 ENCOUNTER — Encounter: Payer: Self-pay | Admitting: Orthopaedic Surgery

## 2019-11-29 VITALS — Ht 64.0 in | Wt 258.6 lb

## 2019-11-29 DIAGNOSIS — M1711 Unilateral primary osteoarthritis, right knee: Secondary | ICD-10-CM

## 2019-11-29 DIAGNOSIS — M1712 Unilateral primary osteoarthritis, left knee: Secondary | ICD-10-CM

## 2019-11-29 DIAGNOSIS — M25561 Pain in right knee: Secondary | ICD-10-CM

## 2019-11-29 DIAGNOSIS — G8929 Other chronic pain: Secondary | ICD-10-CM | POA: Diagnosis not present

## 2019-11-29 DIAGNOSIS — M25562 Pain in left knee: Secondary | ICD-10-CM

## 2019-11-29 NOTE — Progress Notes (Signed)
The patient still dealing with debilitating arthritis of her left knee.  She does have significant arthritis in the right knee as well.  She did have a hyaluronic acid injection in the left knee in August.  The few weeks ago I did inject the left knee with a steroid due to the severity of her pain.  Her BMI today is 44.  She does have an upcoming appointment with Dr. Leafy Ro.  She is working on weight loss.  There is been no other acute change in medical status.  The most recent steroid injection left knee has helped.  She does have significant varus malalignment of both knees.  We will see her back in early January for repeat weight and BMI calculation.  Hopefully by then we will be working on scheduling her surgery.

## 2019-12-12 ENCOUNTER — Other Ambulatory Visit: Payer: Self-pay

## 2019-12-12 ENCOUNTER — Ambulatory Visit (INDEPENDENT_AMBULATORY_CARE_PROVIDER_SITE_OTHER): Payer: Medicare Other | Admitting: Bariatrics

## 2019-12-12 ENCOUNTER — Encounter (INDEPENDENT_AMBULATORY_CARE_PROVIDER_SITE_OTHER): Payer: Self-pay | Admitting: Bariatrics

## 2019-12-12 VITALS — BP 150/84 | HR 74 | Temp 98.3°F | Ht 65.0 in | Wt 252.0 lb

## 2019-12-12 DIAGNOSIS — E559 Vitamin D deficiency, unspecified: Secondary | ICD-10-CM

## 2019-12-12 DIAGNOSIS — R7303 Prediabetes: Secondary | ICD-10-CM | POA: Diagnosis not present

## 2019-12-12 DIAGNOSIS — Z1331 Encounter for screening for depression: Secondary | ICD-10-CM | POA: Diagnosis not present

## 2019-12-12 DIAGNOSIS — M1990 Unspecified osteoarthritis, unspecified site: Secondary | ICD-10-CM | POA: Diagnosis not present

## 2019-12-12 DIAGNOSIS — R5383 Other fatigue: Secondary | ICD-10-CM | POA: Diagnosis not present

## 2019-12-12 DIAGNOSIS — Z6841 Body Mass Index (BMI) 40.0 and over, adult: Secondary | ICD-10-CM

## 2019-12-12 DIAGNOSIS — E7849 Other hyperlipidemia: Secondary | ICD-10-CM

## 2019-12-12 DIAGNOSIS — E785 Hyperlipidemia, unspecified: Secondary | ICD-10-CM | POA: Insufficient documentation

## 2019-12-12 DIAGNOSIS — I10 Essential (primary) hypertension: Secondary | ICD-10-CM

## 2019-12-12 DIAGNOSIS — R0602 Shortness of breath: Secondary | ICD-10-CM | POA: Diagnosis not present

## 2019-12-12 DIAGNOSIS — G4733 Obstructive sleep apnea (adult) (pediatric): Secondary | ICD-10-CM

## 2019-12-12 DIAGNOSIS — Z8679 Personal history of other diseases of the circulatory system: Secondary | ICD-10-CM

## 2019-12-12 DIAGNOSIS — Z0289 Encounter for other administrative examinations: Secondary | ICD-10-CM

## 2019-12-12 NOTE — Progress Notes (Signed)
Dear Diana Rosenthal, MD,   Thank you for referring [name] to our clinic. The following note includes my evaluation and treatment recommendations.  Chief Complaint:   OBESITY [name] (MR# [id number]) is a 74 y.o. female who presents for evaluation and treatment of obesity and related comorbidities. Current BMI is Body mass index is 41.93 kg/m.Marland Kitchen [name] has been struggling with her weight for many years and has been unsuccessful in either losing weight, maintaining weight loss, or reaching her healthy weight goal.  [name] is currently in the action stage of change and ready to dedicate time achieving and maintaining a healthier weight. [name] is interested in becoming our patient and working on intensive lifestyle modifications including (but not limited to) diet and exercise for weight loss.  [name] sometimes likes to cook, but notes obstacles as pain in knees and back. She craves fried foods at times. She skips breakfast.  [name]'s habits were reviewed today and are as follows: her desired weight loss is 92-102 lbs, she started gaining weight ~10 years ago, her heaviest weight ever was 265 pounds, she craves steak, fried foods, and all veggies, she skips breakfast frequently, she is frequently drinking liquids with calories, she sometimes eats larger portions than normal and she struggles with emotional eating.  Depression Screen [name]'s Food and Mood (modified PHQ-9) score was 9.  Depression screen PHQ 2/9 12/12/2019  Decreased Interest 3  Down, Depressed, Hopeless 1  PHQ - 2 Score 4  Altered sleeping 1  Tired, decreased energy 2  Change in appetite 1  Feeling bad or failure about yourself  0  Trouble concentrating 1  Moving slowly or fidgety/restless 0  Suicidal thoughts 0  PHQ-9 Score 9  Difficult doing work/chores Somewhat difficult   Subjective:   Other fatigue. [name] denies daytime somnolence and admits to waking up still tired sometimes. [name]  generally gets 6 hours of sleep per night, and states that she has generally restful sleep. Snoring is present. Apneic episodes are not present with CPAP. Epworth Sleepiness Score is 6.  SOB (shortness of breath) on exertion. [name] notes increasing shortness of breath with exercising and seems to be worsening over time with weight gain. She notes getting out of breath sooner with activity than she used to. This has gotten worse recently. [name] denies shortness of breath at rest or orthopnea.  Essential hypertension. Blood pressure is elevated today at 150/84. [name] did not take her medication today.  BP Readings from Last 3 Encounters:  12/12/19 (!) 150/84  10/20/19 (!) 147/85  08/12/17 115/60   Lab Results  Component Value Date   CREATININE 1.06 (H) 08/12/2017   CREATININE 1.15 (H) 04/20/2017   CREATININE 0.95 02/23/2015   Osteoarthritis, unspecified osteoarthritis type, unspecified site. [name] reports osteoarthritis in knees, back, and shoulders and takes Tylenol Extra-Strength.  Other hyperlipidemia. [name] is taking atorvastatin. Lipid levels are currently controlled.   Lab Results  Component Value Date   CHOL 167 04/20/2017   HDL 36 (L) 04/20/2017   LDLCALC 116 (H) 04/20/2017   TRIG 74 04/20/2017   CHOLHDL 4.6 04/20/2017   Lab Results  Component Value Date   ALT 13 08/12/2017   AST 17 08/12/2017   ALKPHOS 64 08/12/2017   BILITOT 0.4 08/12/2017   The ASCVD Risk score [name]., et al., 2013) failed to calculate for the following reasons:   Unable to determine if patient is Non-Hispanic African American  History of left bundle branch block (LBBB). [name]  is taking Coreg. EKG with left bundle branch block, incomplete. She denies chest pain or dyspnea. She is aware of history and had seen a cardiologist.  Vitamin D deficiency. No nausea, vomiting, or muscle weakness.   Prediabetes. [name] has a diagnosis of prediabetes based on her elevated HgA1c and was informed  this puts her at greater risk of developing diabetes. She continues to work on diet and exercise to decrease her risk of diabetes. She denies nausea or hypoglycemia. [name] is on no medication.  Lab Results  Component Value Date   HGBA1C 6.0 (H) 04/20/2017   No results found for: INSULIN  OSA (obstructive sleep apnea). [name] uses CPAP.  Depression screening. [name] has a mildly positive depression screen with a PHQ-9 score of 9.  Assessment/Plan:   Other fatigue. [name] does feel that her weight is causing her energy to be lower than it should be. Fatigue may be related to obesity, depression or many other causes. Labs will be ordered, and in the meanwhile, [name] will focus on self care including making healthy food choices, increasing physical activity and focusing on stress reduction. EKG 12-Lead, Comprehensive metabolic panel, Hemoglobin A1c, Insulin, random, Lipid Panel With LDL/HDL Ratio, T3, T4, free, TSH, VITAMIN D 25 Hydroxy (Vit-D Deficiency, Fractures) tests ordered today.  SOB (shortness of breath) on exertion. [name] does feel that she gets out of breath more easily that she used to when she exercises. [name]'s shortness of breath appears to be obesity related and exercise induced. She has agreed to work on weight loss and gradually increase exercise to treat her exercise induced shortness of breath. Will continue to monitor closely. Lipid Panel With LDL/HDL Ratio will be checked today.  Essential hypertension. [name] is working on healthy weight loss and exercise to improve blood pressure control. We will watch for signs of hypotension as she continues her lifestyle modifications. She was instructed to take her blood pressure medication regularly as directed.   Osteoarthritis, unspecified osteoarthritis type, unspecified site. [name] was advised to avoid pounding exercises.  Other hyperlipidemia. Cardiovascular risk and specific lipid/LDL goals reviewed.  We discussed several  lifestyle modifications today and [name] will continue to work on diet, exercise and weight loss efforts. Orders and follow up as documented in patient record. She will continue atorvastatin as directed.  Counseling Intensive lifestyle modifications are the first line treatment for this issue. . Dietary changes: Increase soluble fiber. Decrease simple carbohydrates. . Exercise changes: Moderate to vigorous-intensity aerobic activity 150 minutes per week if tolerated. . Lipid-lowering medications: see documented in medical record.   History of left bundle branch block (LBBB). [name] will continue her medication as directed.   Vitamin D deficiency. Low Vitamin D level contributes to fatigue and are associated with obesity, breast, and colon cancer. VITAMIN D 25 Hydroxy (Vit-D Deficiency, Fractures) level will be checked today.   Prediabetes. [name] will continue to work on weight loss, exercise, and decreasing simple carbohydrates to help decrease the risk of diabetes. Hemoglobin A1c, Insulin, random levels will be checked today.  OSA (obstructive sleep apnea). Intensive lifestyle modifications are the first line treatment for this issue. We discussed several lifestyle modifications today and she will continue to work on diet, exercise and weight loss efforts. We will continue to monitor. Orders and follow up as documented in patient record. [name] will continue CPAP nightly.  Depression screening. [name] had a positive depression screening. Depression is commonly associated with obesity and often results in emotional eating behaviors. We will monitor this closely  and work on CBT to help improve the non-hunger eating patterns. Referral to Psychology may be required if no improvement is seen as she continues in our clinic.  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  [name] is currently in the action stage of  change and her goal is to continue with weight loss efforts. I recommend [name] begin the structured treatment plan as follows:  She has agreed to the Category 1 Plan.  She will work on meal planning, intentional eating, decreasing meal skipping, and will stop all sugary drinks.  Exercise goals: Older adults should follow the adult guidelines. When older adults cannot meet the adult guidelines, they should be as physically active as their abilities and conditions will allow.    Behavioral modification strategies: increasing lean protein intake, decreasing simple carbohydrates, increasing vegetables, increasing water intake, decreasing eating out, no skipping meals, meal planning and cooking strategies, keeping healthy foods in the home and planning for success.  She was informed of the importance of frequent follow-up visits to maximize her success with intensive lifestyle modifications for her multiple health conditions. She was informed we would discuss her lab results at her next visit unless there is a critical issue that needs to be addressed sooner. [place] agreed to keep her next visit at the agreed upon time to discuss these results.  Objective:   Blood pressure (!) 150/84, pulse 74, temperature 98.3 F (36.8 C), temperature source Oral, height 5\' 5"  (1.651 m), weight 252 lb (114.3 kg), SpO2 95 %. Body mass index is 41.93 kg/m.  EKG: Sinus  Rhythm with a rate of 73 BPM. Incomplete left bundle branch block (previous history). Voltage criteria for LVH  (R(I)+S(III) exceeds 2.00 mV). Poor R-wave progression - may be secondary to left ventricular hypertrophy - ST depression - seen with left ventricular hypertrophy (strain) or digitalis effect - consider ischemia.   Indirect Calorimeter completed today shows a VO2 of 225 and a REE of 1565.  Her calculated basal metabolic rate is 5188 thus her basal metabolic rate is worse than expected.  General: Cooperative, alert, well developed, in no  acute distress. HEENT: Conjunctivae and lids unremarkable. Cardiovascular: Regular rhythm.  Lungs: Normal work of breathing. Neurologic: No focal deficits.   Lab Results  Component Value Date   CREATININE 1.06 (H) 08/12/2017   BUN 8 08/12/2017   NA 138 08/12/2017   K 4.1 08/12/2017   CL 108 08/12/2017   CO2 24 08/12/2017   Lab Results  Component Value Date   ALT 13 08/12/2017   AST 17 08/12/2017   ALKPHOS 64 08/12/2017   BILITOT 0.4 08/12/2017   Lab Results  Component Value Date   HGBA1C 6.0 (H) 04/20/2017   No results found for: INSULIN Lab Results  Component Value Date   TSH 1.587 06/18/2014   Lab Results  Component Value Date   CHOL 167 04/20/2017   HDL 36 (L) 04/20/2017   LDLCALC 116 (H) 04/20/2017   TRIG 74 04/20/2017   CHOLHDL 4.6 04/20/2017   Lab Results  Component Value Date   WBC 5.2 08/12/2017   HGB 13.7 08/12/2017   HCT 41.7 08/12/2017   MCV 94.3 08/12/2017   PLT 337 08/12/2017   No results found for: IRON, TIBC, FERRITIN  Obesity Behavioral Intervention:   Approximately 15 minutes were spent on the discussion below.  ASK: We discussed the diagnosis of obesity with [name] today and  [name] agreed to give [place] permission to discuss obesity behavioral modification therapy today.  ASSESS: Takeela has the diagnosis of obesity and her BMI today is 43.3. [name] is in the action stage of change.   ADVISE: Amaliya was educated on the multiple health risks of obesity as well as the benefit of weight loss to improve her health. She was advised of the need for long term treatment and the importance of lifestyle modifications to improve her current health and to decrease her risk of future health problems.  AGREE: Multiple dietary modification options and treatment options were discussed and Maki agreed to follow the recommendations documented in the above note.  ARRANGE: [name] was educated on the importance of frequent visits to treat obesity as  outlined per CMS and USPSTF guidelines and agreed to schedule her next follow up appointment today.  Attestation Statements:   Reviewed by clinician on day of visit: allergies, medications, problem list, medical history, surgical history, family history, social history, and previous encounter notes.  [name], am acting as Location manager for [employer], DO   I have reviewed the above documentation for accuracy and completeness, and I agree with the above. Jearld Lesch, DO

## 2019-12-13 ENCOUNTER — Encounter (INDEPENDENT_AMBULATORY_CARE_PROVIDER_SITE_OTHER): Payer: Self-pay | Admitting: Bariatrics

## 2019-12-13 DIAGNOSIS — E559 Vitamin D deficiency, unspecified: Secondary | ICD-10-CM | POA: Insufficient documentation

## 2019-12-13 DIAGNOSIS — R7303 Prediabetes: Secondary | ICD-10-CM | POA: Insufficient documentation

## 2019-12-13 LAB — COMPREHENSIVE METABOLIC PANEL
ALT: 14 IU/L (ref 0–32)
AST: 13 IU/L (ref 0–40)
Albumin/Globulin Ratio: 1.6 (ref 1.2–2.2)
Albumin: 4.4 g/dL (ref 3.7–4.7)
Alkaline Phosphatase: 77 IU/L (ref 44–121)
BUN/Creatinine Ratio: 13 (ref 12–28)
BUN: 12 mg/dL (ref 8–27)
Bilirubin Total: 0.4 mg/dL (ref 0.0–1.2)
CO2: 23 mmol/L (ref 20–29)
Calcium: 9.8 mg/dL (ref 8.7–10.3)
Chloride: 101 mmol/L (ref 96–106)
Creatinine, Ser: 0.92 mg/dL (ref 0.57–1.00)
GFR calc Af Amer: 71 mL/min/{1.73_m2} (ref >59)
GFR calc non Af Amer: 62 mL/min/{1.73_m2} (ref >59)
Globulin, Total: 2.7 g/dL (ref 1.5–4.5)
Glucose: 113 mg/dL — ABNORMAL HIGH (ref 65–99)
Potassium: 4.3 mmol/L (ref 3.5–5.2)
Sodium: 136 mmol/L (ref 134–144)
Total Protein: 7.1 g/dL (ref 6.0–8.5)

## 2019-12-13 LAB — LIPID PANEL WITH LDL/HDL RATIO
Cholesterol, Total: 189 mg/dL (ref 100–199)
HDL: 41 mg/dL (ref >39)
LDL Chol Calc (NIH): 131 mg/dL — ABNORMAL HIGH (ref 0–99)
LDL/HDL Ratio: 3.2 ratio (ref 0.0–3.2)
Triglycerides: 90 mg/dL (ref 0–149)
VLDL Cholesterol Cal: 17 mg/dL (ref 5–40)

## 2019-12-13 LAB — T4, FREE: Free T4: 0.99 ng/dL (ref 0.82–1.77)

## 2019-12-13 LAB — HEMOGLOBIN A1C
Est. average glucose Bld gHb Est-mCnc: 126 mg/dL
Hgb A1c MFr Bld: 6 % — ABNORMAL HIGH (ref 4.8–5.6)

## 2019-12-13 LAB — TSH: TSH: 1.38 u[IU]/mL (ref 0.450–4.500)

## 2019-12-13 LAB — VITAMIN D 25 HYDROXY (VIT D DEFICIENCY, FRACTURES): Vit D, 25-Hydroxy: 12.4 ng/mL — ABNORMAL LOW (ref 30.0–100.0)

## 2019-12-13 LAB — T3: T3, Total: 121 ng/dL (ref 71–180)

## 2019-12-13 LAB — INSULIN, RANDOM: INSULIN: 57.4 u[IU]/mL — ABNORMAL HIGH (ref 2.6–24.9)

## 2019-12-16 DIAGNOSIS — Z23 Encounter for immunization: Secondary | ICD-10-CM | POA: Diagnosis not present

## 2019-12-16 DIAGNOSIS — I10 Essential (primary) hypertension: Secondary | ICD-10-CM | POA: Diagnosis not present

## 2019-12-16 DIAGNOSIS — K219 Gastro-esophageal reflux disease without esophagitis: Secondary | ICD-10-CM | POA: Diagnosis not present

## 2019-12-16 DIAGNOSIS — R7303 Prediabetes: Secondary | ICD-10-CM | POA: Diagnosis not present

## 2019-12-16 DIAGNOSIS — Z1389 Encounter for screening for other disorder: Secondary | ICD-10-CM | POA: Diagnosis not present

## 2019-12-16 DIAGNOSIS — Z0000 Encounter for general adult medical examination without abnormal findings: Secondary | ICD-10-CM | POA: Diagnosis not present

## 2019-12-16 DIAGNOSIS — Z79899 Other long term (current) drug therapy: Secondary | ICD-10-CM | POA: Diagnosis not present

## 2019-12-16 DIAGNOSIS — E7800 Pure hypercholesterolemia, unspecified: Secondary | ICD-10-CM | POA: Diagnosis not present

## 2019-12-26 ENCOUNTER — Ambulatory Visit (INDEPENDENT_AMBULATORY_CARE_PROVIDER_SITE_OTHER): Payer: Medicare Other | Admitting: Bariatrics

## 2019-12-26 ENCOUNTER — Other Ambulatory Visit: Payer: Self-pay

## 2019-12-26 ENCOUNTER — Encounter (INDEPENDENT_AMBULATORY_CARE_PROVIDER_SITE_OTHER): Payer: Self-pay | Admitting: Bariatrics

## 2019-12-26 VITALS — BP 147/80 | HR 74 | Temp 98.7°F | Ht 65.0 in | Wt 247.0 lb

## 2019-12-26 DIAGNOSIS — R7303 Prediabetes: Secondary | ICD-10-CM

## 2019-12-26 DIAGNOSIS — K219 Gastro-esophageal reflux disease without esophagitis: Secondary | ICD-10-CM | POA: Diagnosis not present

## 2019-12-26 DIAGNOSIS — Z6841 Body Mass Index (BMI) 40.0 and over, adult: Secondary | ICD-10-CM | POA: Diagnosis not present

## 2019-12-26 DIAGNOSIS — E559 Vitamin D deficiency, unspecified: Secondary | ICD-10-CM | POA: Diagnosis not present

## 2019-12-26 DIAGNOSIS — I10 Essential (primary) hypertension: Secondary | ICD-10-CM | POA: Diagnosis not present

## 2019-12-26 DIAGNOSIS — E7800 Pure hypercholesterolemia, unspecified: Secondary | ICD-10-CM | POA: Diagnosis not present

## 2019-12-26 MED ORDER — VITAMIN D (ERGOCALCIFEROL) 1.25 MG (50000 UNIT) PO CAPS
50000.0000 [IU] | ORAL_CAPSULE | ORAL | 0 refills | Status: DC
Start: 2019-12-26 — End: 2020-01-30

## 2019-12-27 ENCOUNTER — Encounter (INDEPENDENT_AMBULATORY_CARE_PROVIDER_SITE_OTHER): Payer: Self-pay | Admitting: Bariatrics

## 2019-12-27 NOTE — Progress Notes (Signed)
Chief Complaint:   OBESITY [name] is here to discuss her progress with her obesity treatment plan along with follow-up of her obesity related diagnoses. [name] is on the Category 1 Plan and states she is following her eating plan approximately 80-85% of the time. [name] states she is exercising 0 minutes 0 times per week.  Today's visit was #: 2 Starting weight: 252 lbs Starting date: 12/12/2019 Today's weight: 247 lbs Today's date: 12/26/2019 Total lbs lost to date: 5 Total lbs lost since last in-office visit: 5  Interim History: [name] is down 5 lbs and states she stayed with the plan.  Subjective:   Vitamin D deficiency. [name] is taking OTC Vitamin D supplementation.    Ref. Range 12/12/2019 10:39  Vitamin D, 25-Hydroxy Latest Ref Range: 30.0 - 100.0 ng/mL 12.4 (L)   Prediabetes. [name] has a diagnosis of prediabetes based on her elevated HgA1c and was informed this puts her at greater risk of developing diabetes. She continues to work on diet and exercise to decrease her risk of diabetes. She denies nausea or hypoglycemia. [name] is on no medication.  Lab Results  Component Value Date   HGBA1C 6.0 (H) 12/12/2019   Lab Results  Component Value Date   INSULIN 57.4 (H) 12/12/2019      Assessment/Plan:   Vitamin D deficiency. Low Vitamin D level contributes to fatigue and are associated with obesity, breast, and colon cancer. She was given a prescription Vitamin D @50 ,000 IU every week #4 with 0 refills and will follow-up for routine testing of Vitamin D, at least 2-3 times per year to avoid over-replacement.  Prediabetes. [name] will continue to work on weight loss, exercise, and decreasing simple carbohydrates and refined sweets to help decrease the risk of diabetes.   Class 3 severe obesity with serious comorbidity and body mass index (BMI) of 40.0 to 44.9 in adult, unspecified obesity type ([name]).  [name] is currently in the action stage of change. As such,  her goal is to continue with weight loss efforts. She has agreed to the Category 1 Plan.   She will work on meal planning.  We reviewed with the patient labs from 12/12/2019 including CMP, lipids, Vitamin D, A1c, insulin, and thyroid panel.  Exercise goals: Older adults should follow the adult guidelines. When older adults cannot meet the adult guidelines, they should be as physically active as their abilities and conditions will allow.   Behavioral modification strategies: increasing lean protein intake, decreasing simple carbohydrates, increasing vegetables, increasing water intake, decreasing eating out, no skipping meals, meal planning and cooking strategies, keeping healthy foods in the home and planning for success.  [name] has agreed to follow-up with our clinic in 2 weeks. She was informed of the importance of frequent follow-up visits to maximize her success with intensive lifestyle modifications for her multiple health conditions.   Objective:   Blood pressure (!) 147/80, pulse 74, temperature 98.7 F (37.1 C), height 5\' 5"  (1.651 m), weight 247 lb (112 kg), SpO2 96 %. Body mass index is 41.1 kg/m.  General: Cooperative, alert, well developed, in no acute distress. HEENT: Conjunctivae and lids unremarkable. Cardiovascular: Regular rhythm.  Lungs: Normal work of breathing. Neurologic: No focal deficits.   Lab Results  Component Value Date   CREATININE 0.92 12/12/2019   BUN 12 12/12/2019   NA 136 12/12/2019   K 4.3 12/12/2019   CL 101 12/12/2019   CO2 23 12/12/2019   Lab Results  Component Value Date  ALT 14 12/12/2019   AST 13 12/12/2019   ALKPHOS 77 12/12/2019   BILITOT 0.4 12/12/2019   Lab Results  Component Value Date   HGBA1C 6.0 (H) 12/12/2019   HGBA1C 6.0 (H) 04/20/2017   Lab Results  Component Value Date   INSULIN 57.4 (H) 12/12/2019   Lab Results  Component Value Date   TSH 1.380 12/12/2019   Lab Results  Component Value Date   CHOL 189  12/12/2019   HDL 41 12/12/2019   LDLCALC 131 (H) 12/12/2019   TRIG 90 12/12/2019   CHOLHDL 4.6 04/20/2017   Lab Results  Component Value Date   WBC 5.2 08/12/2017   HGB 13.7 08/12/2017   HCT 41.7 08/12/2017   MCV 94.3 08/12/2017   PLT 337 08/12/2017   No results found for: IRON, TIBC, FERRITIN  Obesity Behavioral Intervention:   Approximately 15 minutes were spent on the discussion below.  ASK: We discussed the diagnosis of obesity with [name] today and [name] agreed to give [place] permission to discuss obesity behavioral modification therapy today.  ASSESS: [name] has the diagnosis of obesity and her BMI today is 41.2. [name] is in the action stage of change.   ADVISE: [name] was educated on the multiple health risks of obesity as well as the benefit of weight loss to improve her health. She was advised of the need for long term treatment and the importance of lifestyle modifications to improve her current health and to decrease her risk of future health problems.  AGREE: Multiple dietary modification options and treatment options were discussed and [name] agreed to follow the recommendations documented in the above note.  ARRANGE: [name] was educated on the importance of frequent visits to treat obesity as outlined per CMS and USPSTF guidelines and agreed to schedule her next follow up appointment today.  Attestation Statements:   Reviewed by clinician on day of visit: allergies, medications, problem list, medical history, surgical history, family history, social history, and previous encounter notes.  [name], am acting as Location manager for [employer], DO   I have reviewed the above documentation for accuracy and completeness, and I agree with the above. Jearld Lesch, DO

## 2020-01-09 ENCOUNTER — Ambulatory Visit (INDEPENDENT_AMBULATORY_CARE_PROVIDER_SITE_OTHER): Payer: Medicare Other | Admitting: Bariatrics

## 2020-01-09 ENCOUNTER — Other Ambulatory Visit: Payer: Self-pay

## 2020-01-09 ENCOUNTER — Encounter (INDEPENDENT_AMBULATORY_CARE_PROVIDER_SITE_OTHER): Payer: Self-pay | Admitting: Bariatrics

## 2020-01-09 VITALS — BP 147/77 | HR 74 | Temp 98.5°F | Ht 65.0 in | Wt 247.0 lb

## 2020-01-09 DIAGNOSIS — Z6841 Body Mass Index (BMI) 40.0 and over, adult: Secondary | ICD-10-CM

## 2020-01-09 DIAGNOSIS — I10 Essential (primary) hypertension: Secondary | ICD-10-CM | POA: Diagnosis not present

## 2020-01-09 DIAGNOSIS — E559 Vitamin D deficiency, unspecified: Secondary | ICD-10-CM | POA: Diagnosis not present

## 2020-01-09 NOTE — Progress Notes (Signed)
Chief Complaint:   OBESITY [name] is here to discuss her progress with her obesity treatment plan along with follow-up of her obesity related diagnoses. Jimmi is on the Category 1 Plan and the Category 2 Plan and states she is following her eating plan approximately 75% of the time. Clair states she is doing chair yoga 15-20 minutes 3 times per week.  Today's visit was #: 3 Starting weight: 252 lbs Starting date: 12/12/2019 Today's weight: 247 lbs Today's date: 01/09/2020 Total lbs lost to date: 5 Total lbs lost since last in-office visit: 0  Interim History: Jeily's weight remains the same since her last visit. She was challenged some during the holiday. She reports doing well with her water intake.  Subjective:   Essential hypertension. Blood pressure is reasonably controlled.  BP Readings from Last 3 Encounters:  01/09/20 (!) 147/77  12/26/19 (!) 147/80  12/12/19 (!) 150/84   Lab Results  Component Value Date   CREATININE 0.92 12/12/2019   CREATININE 1.06 (H) 08/12/2017   CREATININE 1.15 (H) 04/20/2017   Vitamin D deficiency. Tychelle is taking Vitamin D supplementation.    Ref. Range 12/12/2019 10:39  Vitamin D, 25-Hydroxy Latest Ref Range: 30.0 - 100.0 ng/mL 12.4 (L)   Assessment/Plan:   Essential hypertension. Cathren is working on healthy weight loss and exercise to improve blood pressure control. We will watch for signs of hypotension as she continues her lifestyle modifications. She will continue her medication as directed.   Vitamin D deficiency. Low Vitamin D level contributes to fatigue and are associated with obesity, breast, and colon cancer. She agrees to continue to take Vitamin D as directed and will follow-up for routine testing of Vitamin D, at least 2-3 times per year to avoid over-replacement.  Class 3 severe obesity with serious comorbidity and body mass index (BMI) of 40.0 to 44.9 in adult, unspecified obesity type ([place]).  Emmabelle is  currently in the action stage of change. As such, her goal is to continue with weight loss efforts. She has agreed to the Category 1 Plan.   She will work on meal planning and intentional eating.   Exercise goals: Inette will continue her chair yoga and increase over time.  Behavioral modification strategies: increasing lean protein intake, decreasing simple carbohydrates, increasing vegetables, increasing water intake, decreasing eating out, no skipping meals, meal planning and cooking strategies, keeping healthy foods in the home, celebration eating strategies, avoiding temptations and planning for success.  Brianda has agreed to follow-up with our clinic in 2 weeks. She was informed of the importance of frequent follow-up visits to maximize her success with intensive lifestyle modifications for her multiple health conditions.   Objective:   Blood pressure (!) 147/77, pulse 74, temperature 98.5 F (36.9 C), height 5\' 5"  (1.651 m), weight 247 lb (112 kg), SpO2 96 %. Body mass index is 41.1 kg/m.  General: Cooperative, alert, well developed, in no acute distress. HEENT: Conjunctivae and lids unremarkable. Cardiovascular: Regular rhythm.  Lungs: Normal work of breathing. Neurologic: No focal deficits.   Lab Results  Component Value Date   CREATININE 0.92 12/12/2019   BUN 12 12/12/2019   NA 136 12/12/2019   K 4.3 12/12/2019   CL 101 12/12/2019   CO2 23 12/12/2019   Lab Results  Component Value Date   ALT 14 12/12/2019   AST 13 12/12/2019   ALKPHOS 77 12/12/2019   BILITOT 0.4 12/12/2019   Lab Results  Component Value Date   HGBA1C  6.0 (H) 12/12/2019   HGBA1C 6.0 (H) 04/20/2017   Lab Results  Component Value Date   INSULIN 57.4 (H) 12/12/2019   Lab Results  Component Value Date   TSH 1.380 12/12/2019   Lab Results  Component Value Date   CHOL 189 12/12/2019   HDL 41 12/12/2019   LDLCALC 131 (H) 12/12/2019   TRIG 90 12/12/2019   CHOLHDL 4.6 04/20/2017   Lab  Results  Component Value Date   WBC 5.2 08/12/2017   HGB 13.7 08/12/2017   HCT 41.7 08/12/2017   MCV 94.3 08/12/2017   PLT 337 08/12/2017   No results found for: IRON, TIBC, FERRITIN  Obesity Behavioral Intervention:   Approximately 15 minutes were spent on the discussion below.  ASK: We discussed the diagnosis of obesity with [place] today and [place] agreed to give [place] permission to discuss obesity behavioral modification therapy today.  ASSESS: Yailine has the diagnosis of obesity and her BMI today is 41.2. Venecia is in the action stage of change.   ADVISE: Elle was educated on the multiple health risks of obesity as well as the benefit of weight loss to improve her health. She was advised of the need for long term treatment and the importance of lifestyle modifications to improve her current health and to decrease her risk of future health problems.  AGREE: Multiple dietary modification options and treatment options were discussed and Pamalee agreed to follow the recommendations documented in the above note.  ARRANGE: Ariyannah was educated on the importance of frequent visits to treat obesity as outlined per CMS and USPSTF guidelines and agreed to schedule her next follow up appointment today.  Attestation Statements:   Reviewed by clinician on day of visit: allergies, medications, problem list, medical history, surgical history, family history, social history, and previous encounter notes.  [name], am acting as Location manager for [employer], DO   I have reviewed the above documentation for accuracy and completeness, and I agree with the above. Jearld Lesch, DO

## 2020-01-13 ENCOUNTER — Other Ambulatory Visit (INDEPENDENT_AMBULATORY_CARE_PROVIDER_SITE_OTHER): Payer: Self-pay | Admitting: Bariatrics

## 2020-01-16 NOTE — Telephone Encounter (Signed)
This patient was last seen by Dr. Brown, and currently has an upcoming appt scheduled on 01/30/20 with her.  

## 2020-01-23 DIAGNOSIS — G4733 Obstructive sleep apnea (adult) (pediatric): Secondary | ICD-10-CM | POA: Diagnosis not present

## 2020-01-25 ENCOUNTER — Telehealth: Payer: Self-pay | Admitting: Orthopaedic Surgery

## 2020-01-25 NOTE — Telephone Encounter (Signed)
Pt called asking for a rx of hydrocodone for her shoulder pain; pt states she has an appt for 02/01/20. Pt would like a CB to let her know wether this is possible or not.   [phone]

## 2020-01-26 ENCOUNTER — Other Ambulatory Visit: Payer: Self-pay | Admitting: Orthopaedic Surgery

## 2020-01-26 MED ORDER — ACETAMINOPHEN-CODEINE #3 300-30 MG PO TABS: 1.0000 | ORAL_TABLET | Freq: Three times a day (TID) | ORAL | 0 refills | Status: DC | PRN

## 2020-01-26 NOTE — Telephone Encounter (Signed)
Patient aware of below message

## 2020-01-26 NOTE — Telephone Encounter (Signed)
I sent in some tylenol#3 with codeine.  Tell her that we usually don't prescribe something that strong unless someone has just had surgery on that area.

## 2020-01-26 NOTE — Telephone Encounter (Signed)
Please advise 

## 2020-01-30 ENCOUNTER — Other Ambulatory Visit: Payer: Self-pay

## 2020-01-30 ENCOUNTER — Encounter (INDEPENDENT_AMBULATORY_CARE_PROVIDER_SITE_OTHER): Payer: Self-pay | Admitting: Bariatrics

## 2020-01-30 ENCOUNTER — Ambulatory Visit (INDEPENDENT_AMBULATORY_CARE_PROVIDER_SITE_OTHER): Payer: Medicare Other | Admitting: Bariatrics

## 2020-01-30 VITALS — BP 136/83 | HR 76 | Temp 98.3°F | Ht 65.0 in | Wt 245.0 lb

## 2020-01-30 DIAGNOSIS — E559 Vitamin D deficiency, unspecified: Secondary | ICD-10-CM | POA: Diagnosis not present

## 2020-01-30 DIAGNOSIS — Z6841 Body Mass Index (BMI) 40.0 and over, adult: Secondary | ICD-10-CM

## 2020-01-30 DIAGNOSIS — I10 Essential (primary) hypertension: Secondary | ICD-10-CM

## 2020-01-30 DIAGNOSIS — E7800 Pure hypercholesterolemia, unspecified: Secondary | ICD-10-CM | POA: Diagnosis not present

## 2020-01-30 DIAGNOSIS — K219 Gastro-esophageal reflux disease without esophagitis: Secondary | ICD-10-CM | POA: Diagnosis not present

## 2020-01-30 MED ORDER — VITAMIN D (ERGOCALCIFEROL) 1.25 MG (50000 UNIT) PO CAPS: 50000.0000 [IU] | ORAL_CAPSULE | ORAL | 0 refills | Status: DC

## 2020-01-30 NOTE — Progress Notes (Signed)
Chief Complaint:   OBESITY IVALEE French is here to discuss her progress with her obesity treatment plan along with follow-up of her obesity related diagnoses. [name] is on the Category 1 Plan and the Category 2 Plan and states she is following her eating plan approximately 60% of the time. [name] states she is walking 15 minutes 2-3 times per week.  Today's visit was #: 4 Starting weight: 252 lbs Starting date: 12/12/2019 Today's weight: 245 lbs Today's date: 01/30/2020 Total lbs lost to date: 7 Total lbs lost since last in-office visit: 2  Interim History: [name] is down 2 lbs and doing well overall. She states that she has not been eating properly due to arm pain.  Subjective:   Vitamin D deficiency. Erielle denies sunlight exposure.   Ref. Range 12/12/2019 10:39  Vitamin D, 25-Hydroxy Latest Ref Range: 30.0 - 100.0 ng/mL 12.4 (L)   Essential hypertension. [name] is taking Norvasc. Blood pressure is controlled.  BP Readings from Last 3 Encounters:  01/30/20 136/83  01/09/20 (!) 147/77  12/26/19 (!) 147/80   Lab Results  Component Value Date   CREATININE 0.92 12/12/2019   CREATININE 1.06 (H) 08/12/2017   CREATININE 1.15 (H) 04/20/2017   Assessment/Plan:   Vitamin D deficiency. Low Vitamin D level contributes to fatigue and are associated with obesity, breast, and colon cancer. She agrees to continue to take prescription Vitamin D @50 ,000 IU every week #4 with 0 refills and will follow-up for routine testing of Vitamin D, at least 2-3 times per year to avoid over-replacement.  Essential hypertension. [name] is working on healthy weight loss and exercise to improve blood pressure control. We will watch for signs of hypotension as she continues her lifestyle modifications. She will continue Norvasc as directed.   Class 3 severe obesity with serious comorbidity and body mass index (BMI) of 40.0 to 44.9 in adult, unspecified obesity type ([place]).  [name] is currently in  the action stage of change. As such, her goal is to continue with weight loss efforts. She has agreed to the Category 2 Plan with Category 1 and Category 2 breakfast options.   She will work on meal planning and intentional eating.   Handout was provided on "[employer]."  Exercise goals: [name] will see her PCP for arm pain and will walk for exercise.  Behavioral modification strategies: increasing lean protein intake, decreasing simple carbohydrates, increasing vegetables, increasing water intake, decreasing eating out, no skipping meals, meal planning and cooking strategies, keeping healthy foods in the home and planning for success.  [name] has agreed to follow-up with our clinic in 2-3 weeks. She was informed of the importance of frequent follow-up visits to maximize her success with intensive lifestyle modifications for her multiple health conditions.   Objective:   Blood pressure 136/83, pulse 76, temperature 98.3 F (36.8 C), height 5\' 5"  (1.651 m), weight 245 lb (111.1 kg), SpO2 99 %. Body mass index is 40.77 kg/m.  General: Cooperative, alert, well developed, in no acute distress. HEENT: Conjunctivae and lids unremarkable. Cardiovascular: Regular rhythm.  Lungs: Normal work of breathing. Neurologic: No focal deficits.   Lab Results  Component Value Date   CREATININE 0.92 12/12/2019   BUN 12 12/12/2019   NA 136 12/12/2019   K 4.3 12/12/2019   CL 101 12/12/2019   CO2 23 12/12/2019   Lab Results  Component Value Date   ALT 14 12/12/2019   AST 13 12/12/2019   ALKPHOS 77 12/12/2019  BILITOT 0.4 12/12/2019   Lab Results  Component Value Date   HGBA1C 6.0 (H) 12/12/2019   HGBA1C 6.0 (H) 04/20/2017   Lab Results  Component Value Date   INSULIN 57.4 (H) 12/12/2019   Lab Results  Component Value Date   TSH 1.380 12/12/2019   Lab Results  Component Value Date   CHOL 189 12/12/2019   HDL 41 12/12/2019   LDLCALC 131 (H) 12/12/2019   TRIG 90 12/12/2019    CHOLHDL 4.6 04/20/2017   Lab Results  Component Value Date   WBC 5.2 08/12/2017   HGB 13.7 08/12/2017   HCT 41.7 08/12/2017   MCV 94.3 08/12/2017   PLT 337 08/12/2017   No results found for: IRON, TIBC, FERRITIN  Obesity Behavioral Intervention:   Approximately 15 minutes were spent on the discussion below.  ASK: We discussed the diagnosis of obesity with [name] today and [name] agreed to give [place] permission to discuss obesity behavioral modification therapy today.  ASSESS: Toshie has the diagnosis of obesity and her BMI today is 40.8. [name] is in the action stage of change.   ADVISE: [name] was educated on the multiple health risks of obesity as well as the benefit of weight loss to improve her health. She was advised of the need for long term treatment and the importance of lifestyle modifications to improve her current health and to decrease her risk of future health problems.  AGREE: Multiple dietary modification options and treatment options were discussed and [name] agreed to follow the recommendations documented in the above note.  ARRANGE: [name] was educated on the importance of frequent visits to treat obesity as outlined per CMS and USPSTF guidelines and agreed to schedule her next follow up appointment today.  Attestation Statements:   Reviewed by clinician on day of visit: allergies, medications, problem list, medical history, surgical history, family history, social history, and previous encounter notes.  [name], am acting as Location manager for [employer], DO   I have reviewed the above documentation for accuracy and completeness, and I agree with the above. Diana Lesch, DO

## 2020-02-01 ENCOUNTER — Ambulatory Visit: Payer: Self-pay

## 2020-02-01 ENCOUNTER — Ambulatory Visit: Payer: Medicare Other | Admitting: Orthopaedic Surgery

## 2020-02-01 ENCOUNTER — Other Ambulatory Visit: Payer: Self-pay

## 2020-02-01 DIAGNOSIS — M7542 Impingement syndrome of left shoulder: Secondary | ICD-10-CM | POA: Diagnosis not present

## 2020-02-01 DIAGNOSIS — M7541 Impingement syndrome of right shoulder: Secondary | ICD-10-CM

## 2020-02-01 DIAGNOSIS — M25512 Pain in left shoulder: Secondary | ICD-10-CM

## 2020-02-01 DIAGNOSIS — M25511 Pain in right shoulder: Secondary | ICD-10-CM | POA: Diagnosis not present

## 2020-02-01 MED ORDER — METHYLPREDNISOLONE ACETATE 40 MG/ML IJ SUSP
40.0000 mg | INTRAMUSCULAR | Status: AC | PRN
  Administered 2020-02-01: 40 mg via INTRA_ARTICULAR

## 2020-02-01 MED ORDER — LIDOCAINE HCL 1 % IJ SOLN
3.0000 mL | INTRAMUSCULAR | Status: AC | PRN
  Administered 2020-02-01: 3 mL

## 2020-02-01 MED ORDER — METHYLPREDNISOLONE ACETATE 40 MG/ML IJ SUSP
40.0000 mg | INTRAMUSCULAR | Status: AC | PRN
Start: 2020-02-01 — End: 2020-02-01
  Administered 2020-02-01: 40 mg via INTRA_ARTICULAR

## 2020-02-01 NOTE — Progress Notes (Signed)
Office Visit Note   Patient: [name]           Date of Birth: Jul 08, 1945           MRN: [id number] Visit Date: 02/01/2020              Requested by: Lajean Manes, MD [address] [place],  [place] [address] PCP: Lajean Manes, MD   Assessment & Plan: Visit Diagnoses:  1. Left shoulder pain, unspecified chronicity   2. Right shoulder pain, unspecified chronicity   3. Impingement syndrome of right shoulder   4. Impingement syndrome of left shoulder     Plan: She does have clinical exam findings consistent with impingement syndrome of both shoulders with the right worse than left.  I did recommend a subacromial steroid injection in both shoulders and she agrees to this and tolerated them well.  Having had steroid injections before she is fully aware of the risk and benefits of steroid injections.  I do feel that she would benefit from outpatient physical therapy given the limitations of motion of the right shoulder.  She agrees to this treatment plan as well and is receptive to [place] ordering outpatient physical therapy.  I will see her back in 6 weeks to see how she is doing overall.  I would like a weight and BMI calculation at that visit.  Follow-Up Instructions: Return in about 6 weeks (around 03/14/2020).   Orders:  Orders Placed This Encounter  Procedures   Large Joint Inj   Large Joint Inj   XR Shoulder Left   XR Shoulder Right   No orders of the defined types were placed in this encounter.     Procedures: Large Joint Inj: R subacromial bursa on 02/01/2020 10:56 AM Indications: pain and diagnostic evaluation Details: 22 G 1.5 in needle  Arthrogram: No  Medications: 3 mL lidocaine 1 %; 40 mg methylPREDNISolone acetate 40 MG/ML Outcome: tolerated well, no immediate complications Procedure, treatment alternatives, risks and benefits explained, specific risks discussed. Consent was given by the patient. Immediately prior to procedure a time out was  called to verify the correct patient, procedure, equipment, support staff and site/side marked as required. Patient was prepped and draped in the usual sterile fashion.   Large Joint Inj: L subacromial bursa on 02/01/2020 10:56 AM Indications: pain and diagnostic evaluation Details: 22 G 1.5 in needle  Arthrogram: No  Medications: 3 mL lidocaine 1 %; 40 mg methylPREDNISolone acetate 40 MG/ML Outcome: tolerated well, no immediate complications Procedure, treatment alternatives, risks and benefits explained, specific risks discussed. Consent was given by the patient. Immediately prior to procedure a time out was called to verify the correct patient, procedure, equipment, support staff and site/side marked as required. Patient was prepped and draped in the usual sterile fashion.       Clinical Data: No additional findings.   Subjective: Chief Complaint  Patient presents with   Right Shoulder - Pain   Left Shoulder - Pain  The patient is actually well-known to me.  She has debilitating arthritis of both her knees and is in need of knee replacement surgery at some point.  Steroid shots have definitely helped her knees.  She has been going to a weight loss program as well.  Her BMI last is 40.77 so I do feel that she is getting closer to being able to have knee replacement surgery from a weight standpoint.  She comes in today though with a chief complaint  of bilateral shoulder pain with the right worse than left.  She denies any injuries to her shoulders but hurts with overhead activities and reaching behind her.  She reports decreased motion on the right side.  It does wake her up at night.  She is not a diabetic.  She is never had shoulder surgery before.  She says really overhead activities and reaching behind her are so problematic on the right side that she has problems putting her jacket on on the right side.  She denies any neck pain denies any numbness and tingling in her  hands.  HPI  Review of Systems She currently denies any headache, chest pain, shortness of breath, fever, chills, nausea, vomiting  Objective: Vital Signs: There were no vitals taken for this visit.  Physical Exam She is alert and oriented x3 and in no acute distress Ortho Exam Examination of both shoulders shows pain past 90 degrees of abduction and pain with reaching overhead.  Her internal rotation with adduction on the left side is normal and full but her motion reaching behind her on the right side is only to the lower lumbar spine at the waist level.  Her rotator cuff itself feels strong bilaterally.  There is positive Neer and Hawkins signs. Specialty Comments:  No specialty comments available.  Imaging: XR Shoulder Left  Result Date: 02/01/2020 3 views of the left shoulder show no acute findings.  The shoulder is well located.  XR Shoulder Right  Result Date: 02/01/2020 3 views of the right shoulder show no acute findings.    PMFS History: Patient Active Problem List   Diagnosis Date Noted   Prediabetes 12/13/2019   Vitamin D deficiency 12/13/2019   Hyperlipidemia LDL goal <100 12/12/2019   Chronic pain of right knee 11/29/2019   Unilateral primary osteoarthritis, right knee 11/29/2019   Morbid obesity ([place]) 08/17/2019   Chest pain, rule out acute myocardial infarction 04/20/2017   Chronic pain of left knee 04/15/2017   Unilateral primary osteoarthritis, left knee 04/15/2017   Hypertension    Chest pain with low risk for cardiac etiology 06/17/2014   Degenerative arthritis of hip 04/04/2011   Past Medical History:  Diagnosis Date   Anxiety    Arthritis    Back pain    Edema of both lower extremities    Fluid retention    Food allergy    GERD (gastroesophageal reflux disease)    Hyperlipidemia    Hypertension    Knee pain    LBBB (left bundle branch block)    Osteoarthritis    Pre-diabetes    Shoulder pain    Sleep apnea     possibly   Spinal stenosis     Family History  Problem Relation Age of Onset   Hypertension Mother    Heart disease Mother    Diabetes Mother    Obesity Mother    Hypertension Father    Heart disease Father    Heart attack Father    High Cholesterol Father    Depression Father    Anxiety disorder Father    Alcoholism Father    Prostate cancer Brother    Heart attack Brother    Heart disease Brother     Past Surgical History:  Procedure Laterality Date   ABDOMINAL HYSTERECTOMY  1970's   ABDOMINAL HYSTERECTOMY     APPENDECTOMY  1970's   with hysterectomy   APPENDECTOMY     with hysterectomy   COLONOSCOPY     COLONOSCOPY WITH  PROPOFOL N/A 10/04/2013   Procedure: COLONOSCOPY WITH PROPOFOL;  Surgeon: Garlan Fair, MD;  Location: [facility];  Service: Endoscopy;  Laterality: N/A;   OOPHORECTOMY     TOTAL HIP ARTHROPLASTY  04/04/2011   Procedure: TOTAL HIP ARTHROPLASTY ANTERIOR APPROACH;  Surgeon: Mcarthur Rossetti, MD;  Location: [facility];  Service: Orthopedics;  Laterality: Right;   TOTAL HIP ARTHROPLASTY     Social History   Occupational History   Occupation: Dialysis Tech, retired  Tobacco Use   Smoking status: Former Smoker    Packs/day: 1.00    Years: 20.00    Pack years: 20.00    Quit date: 05/23/2011    Years since quitting: 8.7   Smokeless tobacco: Never Used  Substance and Sexual Activity   Alcohol use: No    Comment: occassional   Drug use: No   Sexual activity: Not on file

## 2020-02-09 DIAGNOSIS — I10 Essential (primary) hypertension: Secondary | ICD-10-CM | POA: Diagnosis not present

## 2020-02-10 DIAGNOSIS — I10 Essential (primary) hypertension: Secondary | ICD-10-CM | POA: Diagnosis not present

## 2020-02-13 ENCOUNTER — Ambulatory Visit: Payer: Medicare Other | Admitting: Physical Therapy

## 2020-02-14 ENCOUNTER — Ambulatory Visit (INDEPENDENT_AMBULATORY_CARE_PROVIDER_SITE_OTHER): Payer: Medicare Other | Admitting: Bariatrics

## 2020-02-16 ENCOUNTER — Encounter: Payer: Self-pay | Admitting: Orthopaedic Surgery

## 2020-02-16 ENCOUNTER — Other Ambulatory Visit: Payer: Self-pay

## 2020-02-16 ENCOUNTER — Ambulatory Visit (INDEPENDENT_AMBULATORY_CARE_PROVIDER_SITE_OTHER): Payer: Medicare Other | Admitting: Orthopaedic Surgery

## 2020-02-16 VITALS — Ht 65.0 in | Wt 249.0 lb

## 2020-02-16 DIAGNOSIS — M1711 Unilateral primary osteoarthritis, right knee: Secondary | ICD-10-CM | POA: Diagnosis not present

## 2020-02-16 DIAGNOSIS — M7541 Impingement syndrome of right shoulder: Secondary | ICD-10-CM

## 2020-02-16 DIAGNOSIS — M7542 Impingement syndrome of left shoulder: Secondary | ICD-10-CM | POA: Diagnosis not present

## 2020-02-16 DIAGNOSIS — M1712 Unilateral primary osteoarthritis, left knee: Secondary | ICD-10-CM | POA: Diagnosis not present

## 2020-02-16 NOTE — Progress Notes (Signed)
The patient continues to follow-up with chronic bilateral knee pain and known end-stage arthritis of both knees.  She is try to work on weight loss.  Her last BMI was 40.77.  Today it is 41.44.  She says that she likely gained some weight over the holidays.  Her osteoarthritis is quite severe.  On examination of both knees she does not have a large soft tissue envelope around both knees at all.  There is varus malalignment with patellofemoral crepitation and significant medial joint line tenderness of both knees.  Both knees are ligamentously stable.  I am also following her for bilateral shoulder impingement syndrome.  She has physical therapy that has been rescheduled due to one of the therapist being out sick.  I believe she starts therapy in a week for her shoulders.  I counseled her again about weight loss.  She is given a work card to need for skilled of having a BMI below 40 so we can schedule her for knee replacement surgery.  I like to see her back in 6 weeks for repeat BMI calculation.  All questions and concerns were answered and addressed.

## 2020-02-22 ENCOUNTER — Other Ambulatory Visit: Payer: Self-pay

## 2020-02-22 ENCOUNTER — Encounter (INDEPENDENT_AMBULATORY_CARE_PROVIDER_SITE_OTHER): Payer: Self-pay

## 2020-02-22 ENCOUNTER — Encounter: Payer: Self-pay | Admitting: Rehabilitative and Restorative Service Providers"

## 2020-02-22 ENCOUNTER — Ambulatory Visit: Payer: Medicare Other | Admitting: Rehabilitative and Restorative Service Providers"

## 2020-02-22 DIAGNOSIS — M25511 Pain in right shoulder: Secondary | ICD-10-CM | POA: Diagnosis not present

## 2020-02-22 DIAGNOSIS — M6281 Muscle weakness (generalized): Secondary | ICD-10-CM | POA: Diagnosis not present

## 2020-02-22 DIAGNOSIS — R293 Abnormal posture: Secondary | ICD-10-CM

## 2020-02-22 DIAGNOSIS — G8929 Other chronic pain: Secondary | ICD-10-CM

## 2020-02-22 DIAGNOSIS — M25512 Pain in left shoulder: Secondary | ICD-10-CM

## 2020-02-22 NOTE — Patient Instructions (Signed)
Access Code: [email or web address] URL: [email or web address] Date: 02/22/2020 Prepared by: Scot Jun  Exercises Standing Shoulder Posterior Capsule Stretch - 2 x daily - 7 x weekly - 1 sets - 5 reps - 15 hold Seated Scapular Retraction - 2 x daily - 7 x weekly - 10 reps - 2 sets - 5 hold Sidelying Shoulder External Rotation - 2 x daily - 7 x weekly - 3 sets - 10 reps

## 2020-02-22 NOTE — Therapy (Signed)
[place], [place], [address] Phone: [phone]   Fax:  [phone]  Physical Therapy Evaluation  Patient Details  Name: [name] MRN: [id number] Date of Birth: [birth year] Referring Provider (PT): Dr. Ninfa Linden   Encounter Date: 02/22/2020   PT End of Session - 02/22/20 1008    Visit Number 1    Number of Visits 12    Date for PT Re-Evaluation 04/18/20    PT Start Time 1017    PT Stop Time 1055    PT Time Calculation (min) 38 min    Activity Tolerance Patient tolerated treatment well    Behavior During Therapy [facility] for tasks assessed/performed           Past Medical History:  Diagnosis Date  . Anxiety   . Arthritis   . Back pain   . Edema of both lower extremities   . Fluid retention   . Food allergy   . GERD (gastroesophageal reflux disease)   . Hyperlipidemia   . Hypertension   . Knee pain   . LBBB (left bundle branch block)   . Osteoarthritis   . Pre-diabetes   . Shoulder pain   . Sleep apnea    possibly  . Spinal stenosis     Past Surgical History:  Procedure Laterality Date  . ABDOMINAL HYSTERECTOMY  1970's  . ABDOMINAL HYSTERECTOMY    . APPENDECTOMY  1970's   with hysterectomy  . APPENDECTOMY     with hysterectomy  . COLONOSCOPY    . COLONOSCOPY WITH PROPOFOL N/A 10/04/2013   Procedure: COLONOSCOPY WITH PROPOFOL;  Surgeon: Garlan Fair, MD;  Location: [facility];  Service: Endoscopy;  Laterality: N/A;  . OOPHORECTOMY    . TOTAL HIP ARTHROPLASTY  04/04/2011   Procedure: TOTAL HIP ARTHROPLASTY ANTERIOR APPROACH;  Surgeon: Mcarthur Rossetti, MD;  Location: [facility];  Service: Orthopedics;  Laterality: Right;  . TOTAL HIP ARTHROPLASTY      There were no vitals filed for this visit.    Subjective Assessment - 02/22/20 1020    Subjective Pt. stated onset of symptoms about a month or two ago with difficulty noted c mobility in both shoulders.  Injections in both arms with Lt  improved more than Rt.  Difficulty noted c self care and dressing, lifting/carrying items.  Trouble sleeping due to symptoms at this time.    Currently in Pain? Yes    Pain Score 9    pain at worst 9/10   Pain Location Shoulder    Pain Orientation Left;Right    Pain Descriptors / Indicators Other (Comment);Aching;Sharp    Pain Type Chronic pain    Pain Radiating Towards pain in shoulder, neck and upper arm bilateral Rt> Lt    Pain Onset More than a month ago    Pain Frequency Intermittent    Aggravating Factors  reaching up.back, lifting, carrying sleeping.    Pain Relieving Factors Injection helped, reduce the painful movement.    Effect of Pain on Daily Activities Self care, dressing limitations, lifting/carrying              [facility] PT Assessment - 02/22/20 0001      Assessment   Medical Diagnosis Lt and Rt shoulder pain, impingement    Referring Provider (PT) Dr. Ninfa Linden    Onset Date/Surgical Date 01/11/20    Hand Dominance Right      Precautions   Precaution Comments bilateral knee OA/pain, possible TKA in future  Restrictions   Weight Bearing Restrictions No      Balance Screen   Has the patient fallen in the past 6 months No      [place] residence    Additional Comments Lives with cousin      Prior Function   Vocation Requirements Retired    Leisure Reading, household activity      Cognition   Overall Cognitive Status Within Functional Limits for tasks assessed      Observation/Other Assessments   Focus on Therapeutic Outcomes (FOTO)  intake 45%, expected outcome 62%      Sensation   Light Touch Appears Intact      Posture/Postural Control   Posture/Postural Control Postural limitations    Postural Limitations Rounded Shoulders;Forward head      ROM / Strength   AROM / PROM / Strength Strength;PROM;AROM      AROM   Overall AROM Comments Elevation flexion 110 deg end range pain increased bilateral.  HBB Rt L4  c pain, Lt T8 with pain mild    AROM Assessment Site Shoulder    Right/Left Shoulder Left;Right    Right Shoulder Flexion 140 Degrees   pain   Right Shoulder ABduction 110 Degrees   pain   Right Shoulder Internal Rotation 65 Degrees   pain, measured in supine 45 deg abd   Right Shoulder External Rotation 65 Degrees   pain, measured in supine 45 deg abd   Left Shoulder Flexion 150 Degrees   pain end range   Left Shoulder ABduction 125 Degrees   pain end range   Left Shoulder Internal Rotation 75 Degrees   measured in supine 45 deg abd   Left Shoulder External Rotation 70 Degrees   mild pain end range, measured in supine 45 deg abd     PROM   Overall PROM Comments pain, muscle guarding end range all Rt GH jt movement, no joint restriction noted in mobility, passive accessory assessment.    PROM Assessment Site Shoulder    Right/Left Shoulder Left;Right    Right Shoulder Flexion 143 Degrees    Right Shoulder ABduction 115 Degrees    Right Shoulder External Rotation 65 Degrees      Strength   Overall Strength Comments pain noted in all diretions tested Rt GH jt, flexion/abduction Lt Gh jt    Strength Assessment Site Forearm;Elbow;Shoulder    Right/Left Shoulder Left;Right    Right Shoulder Flexion 4-/5    Right Shoulder ABduction 3+/5    Right Shoulder Internal Rotation 4/5    Right Shoulder External Rotation 4/5    Left Shoulder Flexion 4/5    Left Shoulder ABduction 4/5    Left Shoulder Internal Rotation 4+/5    Left Shoulder External Rotation 4+/5    Right/Left Elbow Left;Right    Right Elbow Flexion 5/5    Right Elbow Extension 5/5    Left Elbow Flexion 5/5    Left Elbow Extension 5/5      Palpation   Palpation comment TrP noted in bilateral infraspinatus, supraspinatus, upper trap.  Rt> Lt      Special Tests   Other special tests (-) Drop arm bilateral, + hawkins kennedy bilateral                      Objective measurements completed on examination: See  above findings.       [facility] Adult PT Treatment/Exercise - 02/22/20 0001      Exercises  Exercises Shoulder;Other Exercises    Other Exercises  HEP performance/instruction per handout c cues for techniques and trial performance set      Shoulder Exercises: Seated   Other Seated Exercises cross arm stretch 15 sec x 3 bilateral, scap retraction x 10      Shoulder Exercises: Sidelying   External Rotation Right;20 reps      Manual Therapy   Manual therapy comments compression to Rt infraspinatus c ER AROM            Trigger Point Dry Needling - 02/22/20 0001    Education Handout Provided Yes                PT Education - 02/22/20 1008    Education Details HEP, POC    Person(s) Educated Patient    Methods Explanation;Demonstration;Verbal cues;Handout    Comprehension Returned demonstration;Verbalized understanding            PT Short Term Goals - 02/22/20 1017      PT SHORT TERM GOAL #1   Title Patient will demonstrate independent use of home exercise program to maintain progress from in clinic treatments.    Time 3    Period Weeks    Status New    Target Date 03/14/20             PT Long Term Goals - 02/22/20 1121      PT LONG TERM GOAL #1   Title Patient will demonstrate/report pain at worst less than or equal to 2/10 to facilitate minimal limitation in daily activity secondary to pain symptoms.    Time 8    Period Weeks    Status New    Target Date 04/18/20      PT LONG TERM GOAL #2   Title Patient will demonstrate independent use of home exercise program to facilitate ability to maintain/progress functional gains from skilled physical therapy services.    Time 8    Period Weeks    Status New    Target Date 04/18/20      PT LONG TERM GOAL #3   Title Patient will demonstrate bilateral [place] joint mobility WFL to facilitate usual self care, dressing, reaching overhead at PLOF s limitation due to symptoms.    Time 8    Period Weeks    Status New     Target Date 04/18/20      PT LONG TERM GOAL #4   Title Patient will demonstrate bilateral UE MMT 5/5 throughout to facilitate usual lifting, carrying in functional activity to PLOF s limitation.    Time 8    Period Weeks    Status New    Target Date 04/18/20      PT LONG TERM GOAL #5   Title Pt. will demonstrate FOTO > 62 % to indicated reduced disability due to condition.    Time 8    Period Weeks    Status New    Target Date 04/18/20      Additional Long Term Goals   Additional Long Term Goals Yes                  Plan - 02/22/20 1017    Clinical Impression Statement Patient is a 75 y.o. female who comes to clinic with complaints of bilateral shoulder pain with mobility, strength deficits that impair their ability to perform usual daily and recreational functional activities without increase difficulty/symptoms at this time.  Patient to benefit from skilled PT services to address  impairments and limitations to improve to previous level of function without restriction secondary to condition.    Personal Factors and Comorbidities Comorbidity 3+    Comorbidities obesity, HTN, hyperlipidemia, OA,    Examination-Activity Limitations Dressing;Hygiene/Grooming;Lift;Reach Overhead;Carry;Bathing;Sleep;Bed Mobility;Toileting    Examination-Participation Restrictions Cleaning;Community Activity;Driving;Dorita Sciara    Stability/Clinical Decision Making Stable/Uncomplicated    Clinical Decision Making Low    Rehab Potential Good    PT Frequency --   1-2x/week   PT Duration 8 weeks    PT Treatment/Interventions ADLs/Self Care Home Management;Cryotherapy;Electrical Stimulation;Iontophoresis 4mg /ml Dexamethasone;Moist Heat;Balance training;Therapeutic activities;Therapeutic exercise;Functional mobility training;Stair training;Gait training;DME Instruction;Ultrasound;Neuromuscular re-education;Patient/family education;Manual techniques;Vasopneumatic Device;Taping;Dry  needling;Passive range of motion;Spinal Manipulations;Joint Manipulations    PT Next Visit Plan DN/compression to trigger points for pain relief.  Progress strengthening, mobility gains in ther ex intervention.    PT Home Exercise Plan [id number]    Consulted and Agree with Plan of Care Patient           Patient will benefit from skilled therapeutic intervention in order to improve the following deficits and impairments:  Decreased endurance,Pain,Impaired UE functional use,Increased fascial restricitons,Decreased strength,Decreased activity tolerance,Decreased mobility,Increased muscle spasms,Impaired perceived functional ability,Improper body mechanics,Postural dysfunction,Impaired flexibility,Decreased range of motion  Visit Diagnosis: Chronic right shoulder pain  Chronic left shoulder pain  Muscle weakness (generalized)  Abnormal posture     Problem List Patient Active Problem List   Diagnosis Date Noted  . Prediabetes 12/13/2019  . Vitamin D deficiency 12/13/2019  . Hyperlipidemia LDL goal <100 12/12/2019  . Chronic pain of right knee 11/29/2019  . Unilateral primary osteoarthritis, right knee 11/29/2019  . Morbid obesity ([place]) 08/17/2019  . Chest pain, rule out acute myocardial infarction 04/20/2017  . Chronic pain of left knee 04/15/2017  . Unilateral primary osteoarthritis, left knee 04/15/2017  . Hypertension   . Chest pain with low risk for cardiac etiology 06/17/2014  . Degenerative arthritis of hip 04/04/2011    Scot Jun, PT, DPT, OCS, ATC 02/22/20  11:51 AM    [facility] [address] [place], [place], [address] Phone: [phone]   Fax:  [phone]  Name: [name] MRN: [id number] Date of Birth: [birth year]

## 2020-02-23 ENCOUNTER — Telehealth (INDEPENDENT_AMBULATORY_CARE_PROVIDER_SITE_OTHER): Payer: Medicare Other | Admitting: Bariatrics

## 2020-02-23 ENCOUNTER — Encounter (INDEPENDENT_AMBULATORY_CARE_PROVIDER_SITE_OTHER): Payer: Self-pay | Admitting: Bariatrics

## 2020-02-23 DIAGNOSIS — I10 Essential (primary) hypertension: Secondary | ICD-10-CM | POA: Diagnosis not present

## 2020-02-23 DIAGNOSIS — Z6841 Body Mass Index (BMI) 40.0 and over, adult: Secondary | ICD-10-CM

## 2020-02-23 DIAGNOSIS — E559 Vitamin D deficiency, unspecified: Secondary | ICD-10-CM

## 2020-02-23 MED ORDER — VITAMIN D (ERGOCALCIFEROL) 1.25 MG (50000 UNIT) PO CAPS: 50000.0000 [IU] | ORAL_CAPSULE | ORAL | 0 refills | Status: DC

## 2020-02-24 DIAGNOSIS — G4733 Obstructive sleep apnea (adult) (pediatric): Secondary | ICD-10-CM | POA: Diagnosis not present

## 2020-02-27 ENCOUNTER — Encounter: Payer: Medicare Other | Admitting: Rehabilitative and Restorative Service Providers"

## 2020-02-28 NOTE — Progress Notes (Signed)
TeleHealth Visit:  Due to the COVID-19 pandemic, this visit was completed with telemedicine (audio/video) technology to reduce patient and provider exposure as well as to preserve personal protective equipment.   [name] has verbally consented to this TeleHealth visit. The patient is located at home, the provider is located at the [facility] office. The participants in this visit include the listed provider and patient. The visit was conducted today via MyChart video.  Chief Complaint: OBESITY [name] is here to discuss her progress with her obesity treatment plan along with follow-up of her obesity related diagnoses. [name] is on the Category 1 Plan and the Category 2 Plan breakfast options and states she is following her eating plan approximately 60-65% of the time. [name] states she is walking around the house for 10 minutes 3 times per day.  Today's visit was #: 5 Starting weight: 252 lbs Starting date: 12/12/2019  Interim History: Ha states that her weight is up a little.  She had a death in the family and has struggled.    Subjective:   1. Vitamin D deficiency [name]'s Vitamin D level was 12.4 on 12/12/2019. She is currently taking prescription vitamin D 50,000 IU each week. She denies nausea, vomiting or muscle weakness.  She gets minimal sun exposure.  2. Essential hypertension Review: taking medications as instructed, no medication side effects noted, no chest pain on exertion, no dyspnea on exertion, no swelling of ankles.  She is taking medications as directed.  Blood pressure 122/60 at home today.  BP Readings from Last 3 Encounters:  01/30/20 136/83  01/09/20 (!) 147/77  12/26/19 (!) 147/80   Assessment/Plan:   1. Vitamin D deficiency Low Vitamin D level contributes to fatigue and are associated with obesity, breast, and colon cancer. She agrees to continue to take prescription Vitamin D @50 ,000 IU every week and will follow-up for routine testing of  Vitamin D, at least 2-3 times per year to avoid over-replacement.  -Refill Vitamin D, Ergocalciferol, (DRISDOL) 1.25 MG (50000 UNIT) CAPS capsule; Take 1 capsule (50,000 Units total) by mouth every 7 (seven) days.  Dispense: 4 capsule; Refill: 0  2. Essential hypertension [name] is working on healthy weight loss and exercise to improve blood pressure control. We will watch for signs of hypotension as she continues her lifestyle modifications.  Continue medications.  3. Class 3 severe obesity with serious comorbidity and body mass index (BMI) of 40.0 to 44.9 in adult, unspecified obesity type [facility])  [name] is currently in the action stage of change. As such, her goal is to continue with weight loss efforts. She has agreed to the Category 2 Plan.   She will work on meal planning and will adhere closely to the plan.  Exercise goals: She is going to PT and will continue.  Behavioral modification strategies: increasing lean protein intake, decreasing simple carbohydrates, increasing vegetables, increasing water intake, decreasing eating out, no skipping meals, meal planning and cooking strategies, keeping healthy foods in the home and planning for success.  [name] has agreed to follow-up with our clinic in 2-3 weeks. She was informed of the importance of frequent follow-up visits to maximize her success with intensive lifestyle modifications for her multiple health conditions.  Objective:   VITALS: Per patient if applicable, see vitals. GENERAL: Alert and in no acute distress. CARDIOPULMONARY: No increased WOB. Speaking in clear sentences.  PSYCH: Pleasant and cooperative. Speech normal rate and rhythm. Affect is appropriate. Insight and judgement are appropriate. Attention is focused,  linear, and appropriate.  NEURO: Oriented as arrived to appointment on time with no prompting.   Lab Results  Component Value Date   CREATININE 0.92 12/12/2019   BUN 12 12/12/2019   NA 136 12/12/2019   K 4.3  12/12/2019   CL 101 12/12/2019   CO2 23 12/12/2019   Lab Results  Component Value Date   ALT 14 12/12/2019   AST 13 12/12/2019   ALKPHOS 77 12/12/2019   BILITOT 0.4 12/12/2019   Lab Results  Component Value Date   HGBA1C 6.0 (H) 12/12/2019   HGBA1C 6.0 (H) 04/20/2017   Lab Results  Component Value Date   INSULIN 57.4 (H) 12/12/2019   Lab Results  Component Value Date   TSH 1.380 12/12/2019   Lab Results  Component Value Date   CHOL 189 12/12/2019   HDL 41 12/12/2019   LDLCALC 131 (H) 12/12/2019   TRIG 90 12/12/2019   CHOLHDL 4.6 04/20/2017   Lab Results  Component Value Date   WBC 5.2 08/12/2017   HGB 13.7 08/12/2017   HCT 41.7 08/12/2017   MCV 94.3 08/12/2017   PLT 337 08/12/2017   Attestation Statements:   Reviewed by clinician on day of visit: allergies, medications, problem list, medical history, surgical history, family history, social history, and previous encounter notes.  I, Water quality scientist, CMA, am acting as Location manager for [employer], DO  I have reviewed the above documentation for accuracy and completeness, and I agree with the above. Jearld Lesch, DO

## 2020-03-01 ENCOUNTER — Encounter (INDEPENDENT_AMBULATORY_CARE_PROVIDER_SITE_OTHER): Payer: Self-pay | Admitting: Bariatrics

## 2020-03-01 DIAGNOSIS — K219 Gastro-esophageal reflux disease without esophagitis: Secondary | ICD-10-CM | POA: Diagnosis not present

## 2020-03-01 DIAGNOSIS — I10 Essential (primary) hypertension: Secondary | ICD-10-CM | POA: Diagnosis not present

## 2020-03-01 DIAGNOSIS — E7800 Pure hypercholesterolemia, unspecified: Secondary | ICD-10-CM | POA: Diagnosis not present

## 2020-03-07 ENCOUNTER — Other Ambulatory Visit: Payer: Self-pay

## 2020-03-07 ENCOUNTER — Ambulatory Visit (INDEPENDENT_AMBULATORY_CARE_PROVIDER_SITE_OTHER): Payer: Medicare Other | Admitting: Rehabilitative and Restorative Service Providers"

## 2020-03-07 ENCOUNTER — Encounter: Payer: Self-pay | Admitting: Rehabilitative and Restorative Service Providers"

## 2020-03-07 DIAGNOSIS — R293 Abnormal posture: Secondary | ICD-10-CM

## 2020-03-07 DIAGNOSIS — M6281 Muscle weakness (generalized): Secondary | ICD-10-CM

## 2020-03-07 DIAGNOSIS — M25511 Pain in right shoulder: Secondary | ICD-10-CM

## 2020-03-07 DIAGNOSIS — G8929 Other chronic pain: Secondary | ICD-10-CM

## 2020-03-07 DIAGNOSIS — M25512 Pain in left shoulder: Secondary | ICD-10-CM | POA: Diagnosis not present

## 2020-03-07 NOTE — Therapy (Signed)
[facility] [address] [place], [place], [address] Phone: [phone]   Fax:  [phone]  Physical Therapy Treatment  Patient Details  Name: [name] MRN: [id number] Date of Birth: [birth year] Referring Provider (PT): Dr. Ninfa Linden   Encounter Date: 03/07/2020   PT End of Session - 03/07/20 1336    Visit Number 2    Number of Visits 12    Date for PT Re-Evaluation 04/18/20    PT Start Time 1342    PT Stop Time 1420    PT Time Calculation (min) 38 min    Activity Tolerance Patient tolerated treatment well    Behavior During Therapy [facility] for tasks assessed/performed           Past Medical History:  Diagnosis Date  . Anxiety   . Arthritis   . Back pain   . Edema of both lower extremities   . Fluid retention   . Food allergy   . GERD (gastroesophageal reflux disease)   . Hyperlipidemia   . Hypertension   . Knee pain   . LBBB (left bundle branch block)   . Osteoarthritis   . Pre-diabetes   . Shoulder pain   . Sleep apnea    possibly  . Spinal stenosis     Past Surgical History:  Procedure Laterality Date  . ABDOMINAL HYSTERECTOMY  1970's  . ABDOMINAL HYSTERECTOMY    . APPENDECTOMY  1970's   with hysterectomy  . APPENDECTOMY     with hysterectomy  . COLONOSCOPY    . COLONOSCOPY WITH PROPOFOL N/A 10/04/2013   Procedure: COLONOSCOPY WITH PROPOFOL;  Surgeon: Garlan Fair, MD;  Location: [facility];  Service: Endoscopy;  Laterality: N/A;  . OOPHORECTOMY    . TOTAL HIP ARTHROPLASTY  04/04/2011   Procedure: TOTAL HIP ARTHROPLASTY ANTERIOR APPROACH;  Surgeon: Mcarthur Rossetti, MD;  Location: [facility];  Service: Orthopedics;  Laterality: Right;  . TOTAL HIP ARTHROPLASTY      There were no vitals filed for this visit.   Subjective Assessment - 03/07/20 1345    Subjective Pt. stated feeling some better, not as bad as before.  Pt. stated still not quite the same as normal.  Some improvement in reaching behind back.     Currently in Pain? No/denies    Pain Onset More than a month ago                             [facility] Adult PT Treatment/Exercise - 03/07/20 0001      Shoulder Exercises: ROM/Strengthening   UBE (Upper Arm Bike) Lvl 2.5 3 mins fwd/back each way      Shoulder Exercises: Stretch   Other Shoulder Stretches cross arm stretch 15 sec x 3 bilateral    Other Shoulder Stretches 1 lb bar behind back IR slide x 10, gh ext x 10      Manual Therapy   Manual therapy comments compression to Rt infraspinatus            Trigger Point Dry Needling - 03/07/20 0001    Consent Given? Yes    Education Handout Provided Previously provided    Muscles Treated Upper Quadrant Infraspinatus   Rt   Infraspinatus Response Twitch response elicited                  PT Short Term Goals - 02/22/20 1017      PT SHORT TERM GOAL #1  Title Patient will demonstrate independent use of home exercise program to maintain progress from in clinic treatments.    Time 3    Period Weeks    Status New    Target Date 03/14/20             PT Long Term Goals - 02/22/20 1121      PT LONG TERM GOAL #1   Title Patient will demonstrate/report pain at worst less than or equal to 2/10 to facilitate minimal limitation in daily activity secondary to pain symptoms.    Time 8    Period Weeks    Status New    Target Date 04/18/20      PT LONG TERM GOAL #2   Title Patient will demonstrate independent use of home exercise program to facilitate ability to maintain/progress functional gains from skilled physical therapy services.    Time 8    Period Weeks    Status New    Target Date 04/18/20      PT LONG TERM GOAL #3   Title Patient will demonstrate bilateral [place] joint mobility WFL to facilitate usual self care, dressing, reaching overhead at PLOF s limitation due to symptoms.    Time 8    Period Weeks    Status New    Target Date 04/18/20      PT LONG TERM GOAL #4   Title Patient will  demonstrate bilateral UE MMT 5/5 throughout to facilitate usual lifting, carrying in functional activity to PLOF s limitation.    Time 8    Period Weeks    Status New    Target Date 04/18/20      PT LONG TERM GOAL #5   Title Pt. will demonstrate FOTO > 62 % to indicated reduced disability due to condition.    Time 8    Period Weeks    Status New    Target Date 04/18/20      Additional Long Term Goals   Additional Long Term Goals Yes                 Plan - 03/07/20 1357    Clinical Impression Statement Initial improvement in movement overall compared to evaluation c reduced severity of symptoms indicated.  Infraspinatus concordant symptoms continued in bilateral shoulder.  Recommend continued strengthening and mobility improvement for bilateral UE.    Personal Factors and Comorbidities Comorbidity 3+    Comorbidities obesity, HTN, hyperlipidemia, OA,    Examination-Activity Limitations Dressing;Hygiene/Grooming;Lift;Reach Overhead;Carry;Bathing;Sleep;Bed Mobility;Toileting    Examination-Participation Restrictions Cleaning;Community Activity;Driving;Valla Leaver [facility]    Stability/Clinical Decision Making Stable/Uncomplicated    Rehab Potential Good    PT Frequency --   1-2x/week   PT Duration 8 weeks    PT Treatment/Interventions ADLs/Self Care Home Management;Cryotherapy;Electrical Stimulation;Iontophoresis 4mg /ml Dexamethasone;Moist Heat;Balance training;Therapeutic activities;Therapeutic exercise;Functional mobility training;Stair training;Gait training;DME Instruction;Ultrasound;Neuromuscular re-education;Patient/family education;Manual techniques;Vasopneumatic Device;Taping;Dry needling;Passive range of motion;Spinal Manipulations;Joint Manipulations    PT Next Visit Plan DN/compression to trigger points for pain relief.  Progress strengthening, mobility gains in ther ex intervention.    PT Home Exercise Plan [id number]    Consulted and Agree with Plan of Care Patient            Patient will benefit from skilled therapeutic intervention in order to improve the following deficits and impairments:  Decreased endurance,Pain,Impaired UE functional use,Increased fascial restricitons,Decreased strength,Decreased activity tolerance,Decreased mobility,Increased muscle spasms,Impaired perceived functional ability,Improper body mechanics,Postural dysfunction,Impaired flexibility,Decreased range of motion  Visit Diagnosis: Chronic right shoulder pain  Chronic left shoulder pain  Muscle weakness (generalized)  Abnormal posture     Problem List Patient Active Problem List   Diagnosis Date Noted  . Prediabetes 12/13/2019  . Vitamin D deficiency 12/13/2019  . Hyperlipidemia LDL goal <100 12/12/2019  . Chronic pain of right knee 11/29/2019  . Unilateral primary osteoarthritis, right knee 11/29/2019  . Morbid obesity ([place]) 08/17/2019  . Chest pain, rule out acute myocardial infarction 04/20/2017  . Chronic pain of left knee 04/15/2017  . Unilateral primary osteoarthritis, left knee 04/15/2017  . Hypertension   . Chest pain with low risk for cardiac etiology 06/17/2014  . Degenerative arthritis of hip 04/04/2011    Scot Jun, PT, DPT, OCS, ATC 03/07/20  2:16 PM    [facility] [address] [place], [place], [address] Phone: [phone]   Fax:  [phone]  Name: ANYLAH SCHEIB MRN: [id number] Date of Birth: 08/14/1945

## 2020-03-08 ENCOUNTER — Ambulatory Visit (INDEPENDENT_AMBULATORY_CARE_PROVIDER_SITE_OTHER): Payer: Medicare Other | Admitting: Bariatrics

## 2020-03-11 DIAGNOSIS — I10 Essential (primary) hypertension: Secondary | ICD-10-CM | POA: Diagnosis not present

## 2020-03-12 ENCOUNTER — Encounter: Payer: Medicare Other | Admitting: Rehabilitative and Restorative Service Providers"

## 2020-03-12 DIAGNOSIS — I10 Essential (primary) hypertension: Secondary | ICD-10-CM | POA: Diagnosis not present

## 2020-03-13 ENCOUNTER — Ambulatory Visit (INDEPENDENT_AMBULATORY_CARE_PROVIDER_SITE_OTHER): Payer: Medicare Other | Admitting: Bariatrics

## 2020-03-14 ENCOUNTER — Ambulatory Visit: Payer: Medicare Other | Admitting: Orthopaedic Surgery

## 2020-03-20 ENCOUNTER — Ambulatory Visit: Payer: Medicare Other | Admitting: Rehabilitative and Restorative Service Providers"

## 2020-03-20 ENCOUNTER — Other Ambulatory Visit: Payer: Self-pay

## 2020-03-20 ENCOUNTER — Encounter (INDEPENDENT_AMBULATORY_CARE_PROVIDER_SITE_OTHER): Payer: Self-pay | Admitting: Bariatrics

## 2020-03-20 ENCOUNTER — Ambulatory Visit (INDEPENDENT_AMBULATORY_CARE_PROVIDER_SITE_OTHER): Payer: Medicare Other | Admitting: Bariatrics

## 2020-03-20 VITALS — BP 143/88 | HR 66 | Temp 98.1°F | Ht 65.0 in | Wt 246.0 lb

## 2020-03-20 DIAGNOSIS — Z6841 Body Mass Index (BMI) 40.0 and over, adult: Secondary | ICD-10-CM | POA: Diagnosis not present

## 2020-03-20 DIAGNOSIS — M25512 Pain in left shoulder: Secondary | ICD-10-CM | POA: Diagnosis not present

## 2020-03-20 DIAGNOSIS — E559 Vitamin D deficiency, unspecified: Secondary | ICD-10-CM

## 2020-03-20 DIAGNOSIS — R293 Abnormal posture: Secondary | ICD-10-CM

## 2020-03-20 DIAGNOSIS — M6281 Muscle weakness (generalized): Secondary | ICD-10-CM | POA: Diagnosis not present

## 2020-03-20 DIAGNOSIS — G8929 Other chronic pain: Secondary | ICD-10-CM | POA: Diagnosis not present

## 2020-03-20 DIAGNOSIS — E7849 Other hyperlipidemia: Secondary | ICD-10-CM

## 2020-03-20 DIAGNOSIS — M25511 Pain in right shoulder: Secondary | ICD-10-CM

## 2020-03-20 NOTE — Patient Instructions (Signed)
Access Code: [email or web address] URL: [email or web address] Date: 03/20/2020 Prepared by: Scot Jun  Exercises Standing Shoulder Posterior Capsule Stretch - 2 x daily - 7 x weekly - 1 sets - 5 reps - 15 hold Seated Scapular Retraction - 2 x daily - 7 x weekly - 10 reps - 2 sets - 5 hold Sidelying Shoulder External Rotation - 2 x daily - 7 x weekly - 3 sets - 10 reps Standing Shoulder Row with Anchored Resistance - 2 x daily - 7 x weekly - 10 reps - 3 sets Shoulder Extension with Resistance - 2 x daily - 7 x weekly - 10 reps - 3 sets Supine Shoulder Horizontal Abduction with Resistance - 2 x daily - 7 x weekly - 10 reps - 3 sets Standing Shoulder Flexion with Resistance - 1 x daily - 7 x weekly - 3 sets - 10 reps Scaption with Resistance - 1 x daily - 7 x weekly - 3 sets - 10 reps

## 2020-03-20 NOTE — Therapy (Signed)
[place], [place], [address] Phone: [phone]   Fax:  [phone]  Physical Therapy Treatment  Patient Details  Name: [name] MRN: [id number] Date of Birth: [birth year] Referring Provider (PT): Dr. Ninfa Linden   Encounter Date: 03/20/2020   PT End of Session - 03/20/20 1249    Visit Number 3    Number of Visits 12    Date for PT Re-Evaluation 04/18/20    Progress Note Due on Visit 10    PT Start Time 6644    PT Stop Time 1338    PT Time Calculation (min) 39 min    Activity Tolerance Patient tolerated treatment well    Behavior During Therapy [facility] for tasks assessed/performed           Past Medical History:  Diagnosis Date  . Anxiety   . Arthritis   . Back pain   . Edema of both lower extremities   . Fluid retention   . Food allergy   . GERD (gastroesophageal reflux disease)   . Hyperlipidemia   . Hypertension   . Knee pain   . LBBB (left bundle branch block)   . Osteoarthritis   . Pre-diabetes   . Shoulder pain   . Sleep apnea    possibly  . Spinal stenosis     Past Surgical History:  Procedure Laterality Date  . ABDOMINAL HYSTERECTOMY  1970's  . ABDOMINAL HYSTERECTOMY    . APPENDECTOMY  1970's   with hysterectomy  . APPENDECTOMY     with hysterectomy  . COLONOSCOPY    . COLONOSCOPY WITH PROPOFOL N/A 10/04/2013   Procedure: COLONOSCOPY WITH PROPOFOL;  Surgeon: Garlan Fair, MD;  Location: [facility];  Service: Endoscopy;  Laterality: N/A;  . OOPHORECTOMY    . TOTAL HIP ARTHROPLASTY  04/04/2011   Procedure: TOTAL HIP ARTHROPLASTY ANTERIOR APPROACH;  Surgeon: Mcarthur Rossetti, MD;  Location: [facility];  Service: Orthopedics;  Laterality: Right;  . TOTAL HIP ARTHROPLASTY      There were no vitals filed for this visit.   Subjective Assessment - 03/20/20 1259    Subjective Pt. stated doing better.  Pt. stated she is able to reach better in front and back.  Pt. stated reaching out  with Lt arm is still trouble.    Currently in Pain? No/denies    Pain Score 5    at worst   Pain Location Shoulder    Pain Orientation Left;Right    Pain Onset More than a month ago    Aggravating Factors  reaching out, lifting.              [facility] PT Assessment - 03/20/20 0001      Observation/Other Assessments   Focus on Therapeutic Outcomes (FOTO)  55%                         [facility] Adult PT Treatment/Exercise - 03/20/20 0001      Shoulder Exercises: Supine   Horizontal ABduction Both;Strengthening   3 x 10   Theraband Level (Shoulder Horizontal ABduction) Level 3 (Green)      Shoulder Exercises: Standing   Other Standing Exercises standing 90 deg flexion, scaption green band resistance 2 x 10 each UE    Other Standing Exercises gh ext, rows x 20 each green      Shoulder Exercises: ROM/Strengthening   UBE (Upper Arm Bike) Lvl 3 3 mins fwd/back each way  Shoulder Exercises: Stretch   Other Shoulder Stretches supine cross arm stretch 15 sec x 3 bilateral      Manual Therapy   Manual therapy comments compression to Lt infraspinatus                    PT Short Term Goals - 03/20/20 1310      PT SHORT TERM GOAL #1   Title Patient will demonstrate independent use of home exercise program to maintain progress from in clinic treatments.    Time 3    Period Weeks    Status Achieved    Target Date 03/14/20             PT Long Term Goals - 02/22/20 1121      PT LONG TERM GOAL #1   Title Patient will demonstrate/report pain at worst less than or equal to 2/10 to facilitate minimal limitation in daily activity secondary to pain symptoms.    Time 8    Period Weeks    Status New    Target Date 04/18/20      PT LONG TERM GOAL #2   Title Patient will demonstrate independent use of home exercise program to facilitate ability to maintain/progress functional gains from skilled physical therapy services.    Time 8    Period Weeks    Status  New    Target Date 04/18/20      PT LONG TERM GOAL #3   Title Patient will demonstrate bilateral [place] joint mobility WFL to facilitate usual self care, dressing, reaching overhead at PLOF s limitation due to symptoms.    Time 8    Period Weeks    Status New    Target Date 04/18/20      PT LONG TERM GOAL #4   Title Patient will demonstrate bilateral UE MMT 5/5 throughout to facilitate usual lifting, carrying in functional activity to PLOF s limitation.    Time 8    Period Weeks    Status New    Target Date 04/18/20      PT LONG TERM GOAL #5   Title Pt. will demonstrate FOTO > 62 % to indicated reduced disability due to condition.    Time 8    Period Weeks    Status New    Target Date 04/18/20      Additional Long Term Goals   Additional Long Term Goals Yes                 Plan - 03/20/20 1319    Clinical Impression Statement Attendance to 3 visits to this point has revealed improved active movement and tolerance to daily activity c reduced severity of symptoms.  Strength defiicts still noted at this time in bilateral UE.  Lt shoulder complaints becoming chief complaint compared to Rt due to improvments from manual intervention to Rt to this point.    Personal Factors and Comorbidities Comorbidity 3+    Comorbidities obesity, HTN, hyperlipidemia, OA,    Examination-Activity Limitations Dressing;Hygiene/Grooming;Lift;Reach Overhead;Carry;Bathing;Sleep;Bed Mobility;Toileting    Examination-Participation Restrictions Cleaning;Community Activity;Driving;[facility]alla Leaver [facility]    Stability/Clinical Decision Making Stable/Uncomplicated    Rehab Potential Good    PT Frequency --   1-2x/week   PT Duration 8 weeks    PT Treatment/Interventions ADLs/Self Care Home Management;Cryotherapy;Electrical Stimulation;Iontophoresis 4mg /ml Dexamethasone;Moist Heat;Balance training;Therapeutic activities;Therapeutic exercise;Functional mobility training;Stair training;Gait training;DME  Instruction;Ultrasound;Neuromuscular re-education;Patient/family education;Manual techniques;Vasopneumatic Device;Taping;Dry needling;Passive range of motion;Spinal Manipulations;Joint Manipulations    PT Next Visit Plan DN/compression to trigger points  for pain relief.  Progress strengthening, mobility gains in ther ex intervention.    PT Home Exercise Plan [email or web address]    Consulted and Agree with Plan of Care Patient           Patient will benefit from skilled therapeutic intervention in order to improve the following deficits and impairments:  Decreased endurance,Pain,Impaired UE functional use,Increased fascial restricitons,Decreased strength,Decreased activity tolerance,Decreased mobility,Increased muscle spasms,Impaired perceived functional ability,Improper body mechanics,Postural dysfunction,Impaired flexibility,Decreased range of motion  Visit Diagnosis: Chronic right shoulder pain  Chronic left shoulder pain  Muscle weakness (generalized)  Abnormal posture     Problem List Patient Active Problem List   Diagnosis Date Noted  . Prediabetes 12/13/2019  . Vitamin D deficiency 12/13/2019  . Hyperlipidemia LDL goal <100 12/12/2019  . Chronic pain of right knee 11/29/2019  . Unilateral primary osteoarthritis, right knee 11/29/2019  . Morbid obesity ([place]) 08/17/2019  . Chest pain, rule out acute myocardial infarction 04/20/2017  . Chronic pain of left knee 04/15/2017  . Unilateral primary osteoarthritis, left knee 04/15/2017  . Hypertension   . Chest pain with low risk for cardiac etiology 06/17/2014  . Degenerative arthritis of hip 04/04/2011   Scot Jun, PT, DPT, OCS, ATC 03/20/20  1:26 PM    [facility] [address] [place], [place], [address] Phone: [phone]   Fax:  [phone]  Name: ANIESHA HAUGHN MRN: [id number] Date of Birth: [birth year]

## 2020-03-22 NOTE — Progress Notes (Signed)
Chief Complaint:   OBESITY [name] is here to discuss her progress with her obesity treatment plan along with follow-up of her obesity related diagnoses. [name] is on the Category 2 Plan and states she is following her eating plan approximately 40% of the time. [name] states she is doing physical therapy for 30-40 minutes 2 times per week.  Today'[name] visit was #: 6 Starting weight: 252 lbs Starting date: 12/12/2019 Today'[name] weight: 246 lbs Today'[name] date: 03/20/2020 Total lbs lost to date: 6 lbs Total lbs lost since last in-office visit: 0  Interim History: [name] is up 1 pound since her last visit.  Her body water is up almost 2 pounds by the bioimpedance scale.  She skips breakfast sometimes.  Subjective:   1. Vitamin D deficiency [name]'[name] Vitamin D level was 12.4 on 12/12/2019. She is currently taking prescription vitamin D 50,000 IU each week. She denies nausea, vomiting or muscle weakness.  2. Other hyperlipidemia [name] has hyperlipidemia and has been trying to improve her cholesterol levels with intensive lifestyle modification including a low saturated fat diet, exercise and weight loss. She denies any chest pain, claudication or myalgias.  She is taking Lipitor 10 mg daily.  Lab Results  Component Value Date   ALT 14 12/12/2019   AST 13 12/12/2019   ALKPHOS 77 12/12/2019   BILITOT 0.4 12/12/2019   Lab Results  Component Value Date   CHOL 189 12/12/2019   HDL 41 12/12/2019   LDLCALC 131 (H) 12/12/2019   TRIG 90 12/12/2019   CHOLHDL 4.6 04/20/2017   Assessment/Plan:   1. Vitamin D deficiency Low Vitamin D level contributes to fatigue and are associated with obesity, breast, and colon cancer. She agrees to continue to take prescription Vitamin D @50 ,000 IU every week and will follow-up for routine testing of Vitamin D, at least 2-3 times per year to avoid over-replacement.  2. Other hyperlipidemia Cardiovascular risk and specific lipid/LDL goals reviewed.  We discussed  several lifestyle modifications today and [name] will continue to work on diet, exercise and weight loss efforts. Orders and follow up as documented in patient record.  Continue Lipitor.  Counseling Intensive lifestyle modifications are the first line treatment for this issue. . Dietary changes: Increase soluble fiber. Decrease simple carbohydrates. . Exercise changes: Moderate to vigorous-intensity aerobic activity 150 minutes per week if tolerated. . Lipid-lowering medications: see documented in medical record.  3. Class 3 severe obesity with serious comorbidity and body mass index (BMI) of 40.0 to 44.9 in adult, unspecified obesity type [name])  [name] is currently in the action stage of change. As such, her goal is to continue with weight loss efforts. She has agreed to the Category 2 Plan.   She will work on meal planning, mindful eating, increasing water intake to 80 ounces per day, and will have a protein shake.  Exercise goals: PT for 30-40 minutes 2 times per week.  Behavioral modification strategies: increasing lean protein intake, decreasing simple carbohydrates, increasing vegetables, increasing water intake, decreasing eating out, no skipping meals, meal planning and cooking strategies, keeping healthy foods in the home and planning for success.  [name] has agreed to follow-up with our clinic in 2 weeks. She was informed of the importance of frequent follow-up visits to maximize her success with intensive lifestyle modifications for her multiple health conditions.   Objective:   Blood pressure (!) 143/88, pulse 66, temperature 98.1 F (36.7 C), height 5\' 5"  (1.651 m), weight 246 lb (111.6 kg), SpO2 96 %.  Body mass index is 40.94 kg/m.  General: Cooperative, alert, well developed, in no acute distress. HEENT: Conjunctivae and lids unremarkable. Cardiovascular: Regular rhythm.  She has lower extremity swelling. Lungs: Normal work of breathing. Neurologic: No focal deficits.    Lab Results  Component Value Date   CREATININE 0.92 12/12/2019   BUN 12 12/12/2019   NA 136 12/12/2019   K 4.3 12/12/2019   CL 101 12/12/2019   CO2 23 12/12/2019   Lab Results  Component Value Date   ALT 14 12/12/2019   AST 13 12/12/2019   ALKPHOS 77 12/12/2019   BILITOT 0.4 12/12/2019   Lab Results  Component Value Date   HGBA1C 6.0 (H) 12/12/2019   HGBA1C 6.0 (H) 04/20/2017   Lab Results  Component Value Date   INSULIN 57.4 (H) 12/12/2019   Lab Results  Component Value Date   TSH 1.380 12/12/2019   Lab Results  Component Value Date   CHOL 189 12/12/2019   HDL 41 12/12/2019   LDLCALC 131 (H) 12/12/2019   TRIG 90 12/12/2019   CHOLHDL 4.6 04/20/2017   Lab Results  Component Value Date   WBC 5.2 08/12/2017   HGB 13.7 08/12/2017   HCT 41.7 08/12/2017   MCV 94.3 08/12/2017   PLT 337 08/12/2017   Obesity Behavioral Intervention:   Approximately 15 minutes were spent on the discussion below.  ASK: We discussed the diagnosis of obesity with [name] today and [name] agreed to give [place] permission to discuss obesity behavioral modification therapy today.  ASSESS: [name] has the diagnosis of obesity and her BMI today is 41.0. [name] is in the action stage of change.   ADVISE: [name] was educated on the multiple health risks of obesity as well as the benefit of weight loss to improve her health. She was advised of the need for long term treatment and the importance of lifestyle modifications to improve her current health and to decrease her risk of future health problems.  AGREE: Multiple dietary modification options and treatment options were discussed and [name] agreed to follow the recommendations documented in the above note.  ARRANGE: [name] was educated on the importance of frequent visits to treat obesity as outlined per CMS and USPSTF guidelines and agreed to schedule her next follow up appointment today.  Attestation Statements:   Reviewed by clinician  on day of visit: allergies, medications, problem list, medical history, surgical history, family history, social history, and previous encounter notes.  I, Water quality scientist, CMA, am acting as Location manager for [employer], DO  I have reviewed the above documentation for accuracy and completeness, and I agree with the above. Jearld Lesch, DO

## 2020-03-27 ENCOUNTER — Other Ambulatory Visit (INDEPENDENT_AMBULATORY_CARE_PROVIDER_SITE_OTHER): Payer: Self-pay | Admitting: Bariatrics

## 2020-03-27 DIAGNOSIS — E559 Vitamin D deficiency, unspecified: Secondary | ICD-10-CM

## 2020-03-27 NOTE — Telephone Encounter (Signed)
Last seen by Dr. Brown. 

## 2020-03-30 ENCOUNTER — Encounter: Payer: Medicare Other | Admitting: Rehabilitative and Restorative Service Providers"

## 2020-04-03 ENCOUNTER — Telehealth: Payer: Self-pay | Admitting: Rehabilitative and Restorative Service Providers"

## 2020-04-03 ENCOUNTER — Encounter: Payer: Medicare Other | Admitting: Rehabilitative and Restorative Service Providers"

## 2020-04-03 NOTE — Telephone Encounter (Signed)
Called and spoke with patient who forgot the appointment.  Confirmed next appointment.    Scot Jun, PT, DPT, OCS, ATC 04/03/20  1:27 PM

## 2020-04-04 ENCOUNTER — Ambulatory Visit (INDEPENDENT_AMBULATORY_CARE_PROVIDER_SITE_OTHER): Payer: Medicare Other | Admitting: Bariatrics

## 2020-04-05 ENCOUNTER — Encounter: Payer: Medicare Other | Admitting: Rehabilitative and Restorative Service Providers"

## 2020-04-09 DIAGNOSIS — I10 Essential (primary) hypertension: Secondary | ICD-10-CM | POA: Diagnosis not present

## 2020-04-10 ENCOUNTER — Encounter: Payer: Medicare Other | Admitting: Rehabilitative and Restorative Service Providers"

## 2020-04-12 ENCOUNTER — Encounter: Payer: Self-pay | Admitting: Rehabilitative and Restorative Service Providers"

## 2020-04-12 ENCOUNTER — Other Ambulatory Visit: Payer: Self-pay

## 2020-04-12 ENCOUNTER — Ambulatory Visit: Payer: Medicare Other | Admitting: Rehabilitative and Restorative Service Providers"

## 2020-04-12 DIAGNOSIS — R293 Abnormal posture: Secondary | ICD-10-CM

## 2020-04-12 DIAGNOSIS — M25512 Pain in left shoulder: Secondary | ICD-10-CM

## 2020-04-12 DIAGNOSIS — M6281 Muscle weakness (generalized): Secondary | ICD-10-CM

## 2020-04-12 DIAGNOSIS — M25511 Pain in right shoulder: Secondary | ICD-10-CM

## 2020-04-12 DIAGNOSIS — G8929 Other chronic pain: Secondary | ICD-10-CM | POA: Diagnosis not present

## 2020-04-12 NOTE — Therapy (Addendum)
[place], [place], [address] Phone: [phone]   Fax:  [phone]  Physical Therapy Treatment/Discharge   Patient Details  Name: [name] MRN: [id number] Date of Birth: Oct 04, 1945 Referring Provider (PT): Dr. Ninfa Linden   Encounter Date: 04/12/2020   PT End of Session - 04/12/20 1343    Visit Number 4    Number of Visits 12    Date for PT Re-Evaluation 04/18/20    Progress Note Due on Visit 10    PT Start Time 1343    PT Stop Time 1421    PT Time Calculation (min) 38 min    Activity Tolerance Patient tolerated treatment well    Behavior During Therapy [facility] for tasks assessed/performed           Past Medical History:  Diagnosis Date  . Anxiety   . Arthritis   . Back pain   . Edema of both lower extremities   . Fluid retention   . Food allergy   . GERD (gastroesophageal reflux disease)   . Hyperlipidemia   . Hypertension   . Knee pain   . LBBB (left bundle branch block)   . Osteoarthritis   . Pre-diabetes   . Shoulder pain   . Sleep apnea    possibly  . Spinal stenosis     Past Surgical History:  Procedure Laterality Date  . ABDOMINAL HYSTERECTOMY  1970's  . ABDOMINAL HYSTERECTOMY    . APPENDECTOMY  1970's   with hysterectomy  . APPENDECTOMY     with hysterectomy  . COLONOSCOPY    . COLONOSCOPY WITH PROPOFOL N/A 10/04/2013   Procedure: COLONOSCOPY WITH PROPOFOL;  Surgeon: Garlan Fair, MD;  Location: [facility];  Service: Endoscopy;  Laterality: N/A;  . OOPHORECTOMY    . TOTAL HIP ARTHROPLASTY  04/04/2011   Procedure: TOTAL HIP ARTHROPLASTY ANTERIOR APPROACH;  Surgeon: Mcarthur Rossetti, MD;  Location: [facility];  Service: Orthopedics;  Laterality: Right;  . TOTAL HIP ARTHROPLASTY      There were no vitals filed for this visit.   Subjective Assessment - 04/12/20 1349    Subjective Pt. stated doing better.  Pt. stated some pain in shoulder still, mainly Lt.   Pt. stated pain at  worst in last 2 weeks in Lt shoulder was 5/10 with insidious worsening.  Due to some sickness, Pt. indicated less HEP use.    Currently in Pain? No/denies    Pain Score 5    at worst   Pain Location Shoulder    Pain Orientation Left    Pain Descriptors / Indicators Sharp    Pain Type Chronic pain    Pain Onset More than a month ago    Pain Frequency Occasional    Aggravating Factors  insidious    Pain Relieving Factors some movement helps.              [facility] PT Assessment - 04/12/20 0001      Assessment   Medical Diagnosis Lt and Rt shoulder pain, impingement    Referring Provider (PT) Dr. Ninfa Linden    Onset Date/Surgical Date 01/11/20    Hand Dominance Right      Observation/Other Assessments   Focus on Therapeutic Outcomes (FOTO)  60      AROM   Overall AROM Comments Rt shoulder AROM WFL    Left Shoulder Flexion 170 Degrees    Left Shoulder ABduction 155 Degrees    Left Shoulder Internal Rotation 75 Degrees  Left Shoulder External Rotation 90 Degrees      Strength   Right Shoulder Flexion 5/5    Right Shoulder ABduction 4+/5    Right Shoulder Internal Rotation 5/5    Right Shoulder External Rotation 5/5    Left Shoulder Flexion 4+/5    Left Shoulder ABduction 4/5   pain   Left Shoulder Internal Rotation 5/5    Left Shoulder External Rotation 5/5                         [facility] Adult PT Treatment/Exercise - 04/12/20 0001      Neuro Re-ed    Neuro Re-ed Details  Rhythmic stabilizations in 100 deg flexion      Exercises   Other Exercises  HEP review c printout      Shoulder Exercises: Supine   Horizontal ABduction Both;Strengthening   3 x 10   Theraband Level (Shoulder Horizontal ABduction) Level 3 (Green)      Shoulder Exercises: Standing   Extension Other (comment);Theraband;Both   3x10   Theraband Level (Shoulder Extension) Level 4 (Blue)    Row Theraband;Strengthening;Both   3x10   Theraband Level (Shoulder Row) Level 3 (Green)    Other  Standing Exercises standing 90 deg flexion, scaption green band resistance 2 x 10 each UE                    PT Short Term Goals - 03/20/20 1310      PT SHORT TERM GOAL #1   Title Patient will demonstrate independent use of home exercise program to maintain progress from in clinic treatments.    Time 3    Period Weeks    Status Achieved    Target Date 03/14/20             PT Long Term Goals - 04/12/20 1359      PT LONG TERM GOAL #1   Title Patient will demonstrate/report pain at worst less than or equal to 2/10 to facilitate minimal limitation in daily activity secondary to pain symptoms.    Time 8    Period Weeks    Status Partially Met    Target Date 04/18/20      PT LONG TERM GOAL #2   Title Patient will demonstrate independent use of home exercise program to facilitate ability to maintain/progress functional gains from skilled physical therapy services.    Time 8    Period Weeks    Status Achieved    Target Date 04/18/20      PT LONG TERM GOAL #3   Title Patient will demonstrate bilateral [place] joint mobility WFL to facilitate usual self care, dressing, reaching overhead at PLOF s limitation due to symptoms.    Time 8    Period Weeks    Status Achieved      PT LONG TERM GOAL #4   Title Patient will demonstrate bilateral UE MMT 5/5 throughout to facilitate usual lifting, carrying in functional activity to PLOF s limitation.    Time 8    Period Weeks    Status Partially Met    Target Date 04/18/20      PT LONG TERM GOAL #5   Title Pt. will demonstrate FOTO > 62 % to indicated reduced disability due to condition.    Time 8    Period Weeks    Status Achieved  Plan - 04/12/20 1358    Clinical Impression Statement Pt. has attended 4 visits overall.  See objective data for updated information, showing gains in AROM, strength to this point.  Pt. does have some continued mild to moderate Lt shoulder pain at times but reduced in frequency  and severity.  Pt. may be appropriate for trial of HEP due to success in extended period without visits due to other sickness.  Continued HEP to address remaining impairments would be beneficial.    Personal Factors and Comorbidities Comorbidity 3+    Comorbidities obesity, HTN, hyperlipidemia, OA,    Examination-Activity Limitations Dressing;Hygiene/Grooming;Lift;Reach Overhead;Carry;Bathing;Sleep;Bed Mobility;Toileting    Examination-Participation Restrictions Cleaning;Community Activity;Driving;[facility]alla Leaver [facility]    Stability/Clinical Decision Making Stable/Uncomplicated    Rehab Potential Good    PT Frequency --   1-2x/week   PT Duration 8 weeks    PT Treatment/Interventions ADLs/Self Care Home Management;Cryotherapy;Electrical Stimulation;Iontophoresis 57m/ml Dexamethasone;Moist Heat;Balance training;Therapeutic activities;Therapeutic exercise;Functional mobility training;Stair training;Gait training;DME Instruction;Ultrasound;Neuromuscular re-education;Patient/family education;Manual techniques;Vasopneumatic Device;Taping;Dry needling;Passive range of motion;Spinal Manipulations;Joint Manipulations    PT Next Visit Plan DN/compression to trigger points for pain relief.  Progress strengthening, mobility gains in ther ex intervention.    PT Home Exercise Plan [email or web address]    Consulted and Agree with Plan of Care Patient           Patient will benefit from skilled therapeutic intervention in order to improve the following deficits and impairments:  Decreased endurance,Pain,Impaired UE functional use,Increased fascial restricitons,Decreased strength,Decreased activity tolerance,Decreased mobility,Increased muscle spasms,Impaired perceived functional ability,Improper body mechanics,Postural dysfunction,Impaired flexibility,Decreased range of motion  Visit Diagnosis: Chronic right shoulder pain  Chronic left shoulder pain  Muscle weakness (generalized)  Abnormal posture     Problem  List Patient Active Problem List   Diagnosis Date Noted  . Prediabetes 12/13/2019  . Vitamin D deficiency 12/13/2019  . Hyperlipidemia LDL goal <100 12/12/2019  . Chronic pain of right knee 11/29/2019  . Unilateral primary osteoarthritis, right knee 11/29/2019  . Morbid obesity ([place] 08/17/2019  . Chest pain, rule out acute myocardial infarction 04/20/2017  . Chronic pain of left knee 04/15/2017  . Unilateral primary osteoarthritis, left knee 04/15/2017  . Hypertension   . Chest pain with low risk for cardiac etiology 06/17/2014  . Degenerative arthritis of hip 04/04/2011     Trial HEP at this time, return prn (recertication required on next visit )  MScot Jun PT, DPT, OCS, ATC 04/12/20  2:15 PM  PHYSICAL THERAPY DISCHARGE SUMMARY  Visits from Start of Care: 4  Current functional level related to goals / functional outcomes: See note   Remaining deficits: See note   Education / Equipment: HEP Plan: Patient agrees to discharge.  Patient goals were partially met. Patient is being discharged due to being pleased with the current functional level.  ?????    MScot Jun PT, DPT, OCS, ATC 05/03/20  11:31 AM       [facility] [address] [address]Phone: [phone]  Fax:  [phone] Name: Diana MCGAHANMRN: [id number] of Birth: [birth year]

## 2020-04-17 ENCOUNTER — Encounter: Payer: Medicare Other | Admitting: Rehabilitative and Restorative Service Providers"

## 2020-04-17 DIAGNOSIS — I10 Essential (primary) hypertension: Secondary | ICD-10-CM | POA: Diagnosis not present

## 2020-04-17 DIAGNOSIS — K219 Gastro-esophageal reflux disease without esophagitis: Secondary | ICD-10-CM | POA: Diagnosis not present

## 2020-04-17 DIAGNOSIS — I471 Supraventricular tachycardia: Secondary | ICD-10-CM | POA: Diagnosis not present

## 2020-04-17 DIAGNOSIS — E7800 Pure hypercholesterolemia, unspecified: Secondary | ICD-10-CM | POA: Diagnosis not present

## 2020-04-17 DIAGNOSIS — R7303 Prediabetes: Secondary | ICD-10-CM | POA: Diagnosis not present

## 2020-04-19 ENCOUNTER — Encounter: Payer: Medicare Other | Admitting: Rehabilitative and Restorative Service Providers"

## 2020-05-10 DIAGNOSIS — I10 Essential (primary) hypertension: Secondary | ICD-10-CM | POA: Diagnosis not present

## 2020-06-08 DIAGNOSIS — I10 Essential (primary) hypertension: Secondary | ICD-10-CM | POA: Diagnosis not present

## 2020-06-27 DIAGNOSIS — E7800 Pure hypercholesterolemia, unspecified: Secondary | ICD-10-CM | POA: Diagnosis not present

## 2020-06-27 DIAGNOSIS — I10 Essential (primary) hypertension: Secondary | ICD-10-CM | POA: Diagnosis not present

## 2020-06-27 DIAGNOSIS — K219 Gastro-esophageal reflux disease without esophagitis: Secondary | ICD-10-CM | POA: Diagnosis not present

## 2020-07-19 DIAGNOSIS — K219 Gastro-esophageal reflux disease without esophagitis: Secondary | ICD-10-CM | POA: Diagnosis not present

## 2020-07-19 DIAGNOSIS — E7800 Pure hypercholesterolemia, unspecified: Secondary | ICD-10-CM | POA: Diagnosis not present

## 2020-07-19 DIAGNOSIS — I10 Essential (primary) hypertension: Secondary | ICD-10-CM | POA: Diagnosis not present

## 2020-08-09 DIAGNOSIS — I10 Essential (primary) hypertension: Secondary | ICD-10-CM | POA: Diagnosis not present

## 2020-08-21 DIAGNOSIS — I10 Essential (primary) hypertension: Secondary | ICD-10-CM | POA: Diagnosis not present

## 2020-08-21 DIAGNOSIS — Z79899 Other long term (current) drug therapy: Secondary | ICD-10-CM | POA: Diagnosis not present

## 2020-09-24 DIAGNOSIS — I10 Essential (primary) hypertension: Secondary | ICD-10-CM | POA: Diagnosis not present

## 2020-09-24 DIAGNOSIS — K219 Gastro-esophageal reflux disease without esophagitis: Secondary | ICD-10-CM | POA: Diagnosis not present

## 2020-09-24 DIAGNOSIS — E7800 Pure hypercholesterolemia, unspecified: Secondary | ICD-10-CM | POA: Diagnosis not present

## 2020-09-30 ENCOUNTER — Emergency Department (HOSPITAL_COMMUNITY): Payer: Medicare Other

## 2020-09-30 ENCOUNTER — Other Ambulatory Visit: Payer: Self-pay

## 2020-09-30 ENCOUNTER — Emergency Department (HOSPITAL_COMMUNITY)
Admission: EM | Admit: 2020-09-30 | Discharge: 2020-09-30 | Disposition: A | Discharge status: Home / Self Care | Payer: Medicare Other | Attending: Emergency Medicine | Admitting: Emergency Medicine

## 2020-09-30 DIAGNOSIS — R079 Chest pain, unspecified: Secondary | ICD-10-CM

## 2020-09-30 DIAGNOSIS — R072 Precordial pain: Secondary | ICD-10-CM | POA: Diagnosis not present

## 2020-09-30 DIAGNOSIS — R600 Localized edema: Secondary | ICD-10-CM | POA: Diagnosis not present

## 2020-09-30 DIAGNOSIS — Z96641 Presence of right artificial hip joint: Secondary | ICD-10-CM | POA: Diagnosis not present

## 2020-09-30 DIAGNOSIS — R6889 Other general symptoms and signs: Secondary | ICD-10-CM | POA: Diagnosis not present

## 2020-09-30 DIAGNOSIS — Z87891 Personal history of nicotine dependence: Secondary | ICD-10-CM | POA: Diagnosis not present

## 2020-09-30 DIAGNOSIS — I10 Essential (primary) hypertension: Secondary | ICD-10-CM | POA: Diagnosis not present

## 2020-09-30 DIAGNOSIS — Z743 Need for continuous supervision: Secondary | ICD-10-CM | POA: Diagnosis not present

## 2020-09-30 DIAGNOSIS — R0789 Other chest pain: Secondary | ICD-10-CM | POA: Diagnosis not present

## 2020-09-30 DIAGNOSIS — Z79899 Other long term (current) drug therapy: Secondary | ICD-10-CM | POA: Insufficient documentation

## 2020-09-30 DIAGNOSIS — R52 Pain, unspecified: Secondary | ICD-10-CM

## 2020-09-30 LAB — COMPREHENSIVE METABOLIC PANEL
ALT: 14 U/L (ref 0–44)
AST: 19 U/L (ref 15–41)
Albumin: 3.4 g/dL — ABNORMAL LOW (ref 3.5–5.0)
Alkaline Phosphatase: 52 U/L (ref 38–126)
Anion gap: 7 (ref 5–15)
BUN: 13 mg/dL (ref 8–23)
CO2: 22 mmol/L (ref 22–32)
Calcium: 9 mg/dL (ref 8.9–10.3)
Chloride: 105 mmol/L (ref 98–111)
Creatinine, Ser: 0.95 mg/dL (ref 0.44–1.00)
GFR, Estimated: >60 mL/min (ref >60)
Glucose, Bld: 128 mg/dL — ABNORMAL HIGH (ref 70–99)
Potassium: 3.8 mmol/L (ref 3.5–5.1)
Sodium: 134 mmol/L — ABNORMAL LOW (ref 135–145)
Total Bilirubin: 0.3 mg/dL (ref 0.3–1.2)
Total Protein: 6.1 g/dL — ABNORMAL LOW (ref 6.5–8.1)

## 2020-09-30 LAB — CBC WITH DIFFERENTIAL/PLATELET
Abs Immature Granulocytes: 0.04 10*3/uL (ref 0.00–0.07)
Basophils Absolute: 0.1 10*3/uL (ref 0.0–0.1)
Basophils Relative: 2 %
Eosinophils Absolute: 0.2 10*3/uL (ref 0.0–0.5)
Eosinophils Relative: 3 %
HCT: 37 % (ref 36.0–46.0)
Hemoglobin: 12.6 g/dL (ref 12.0–15.0)
Immature Granulocytes: 1 %
Lymphocytes Relative: 30 %
Lymphs Abs: 1.9 10*3/uL (ref 0.7–4.0)
MCH: 32.4 pg (ref 26.0–34.0)
MCHC: 34.1 g/dL (ref 30.0–36.0)
MCV: 95.1 fL (ref 80.0–100.0)
Monocytes Absolute: 0.5 10*3/uL (ref 0.1–1.0)
Monocytes Relative: 8 %
Neutro Abs: 3.6 10*3/uL (ref 1.7–7.7)
Neutrophils Relative %: 56 %
Platelets: 293 10*3/uL (ref 150–400)
RBC: 3.89 MIL/uL (ref 3.87–5.11)
RDW: 14.1 % (ref 11.5–15.5)
WBC: 6.3 10*3/uL (ref 4.0–10.5)
nRBC: 0 % (ref 0.0–0.2)

## 2020-09-30 LAB — TROPONIN I (HIGH SENSITIVITY)
Troponin I (High Sensitivity): 11 ng/L (ref <18)
Troponin I (High Sensitivity): 9 ng/L (ref <18)

## 2020-09-30 LAB — LIPASE, BLOOD: Lipase: 37 U/L (ref 11–51)

## 2020-09-30 NOTE — ED Triage Notes (Signed)
Pt brought in by EMS for chest pain that started yesterday.

## 2020-09-30 NOTE — Discharge Instructions (Addendum)
Continue medications as previously prescribed.  Call the cardiology clinic on Monday to arrange a follow-up appointment.  The contact information for the cardiology clinic on [facility] has been provided in this discharge summary for you to call and make these arrangements.  Return to the emergency department in the meantime if you develop worsening pain, difficulty breathing, or other new and concerning symptoms.

## 2020-09-30 NOTE — ED Notes (Signed)
Pt discharged and wheeled out of the ED in a wheel chair without difficulty. 

## 2020-09-30 NOTE — ED Provider Notes (Signed)
[facility] EMERGENCY DEPARTMENT Provider Note   CSN: [id number] Arrival date & time: 09/30/20  0155     History Chief Complaint  Patient presents with   Chest Pain    [name] is a 75 y.o. female.  Patient is a 75 year old female with past medical history of GERD, hypertension, prediabetes.  Patient presenting with complaints of chest discomfort.  This started earlier this afternoon while at rest.  She describes a pressure to the center of her chest with no associated shortness of breath, but has felt nauseated.  She denies diaphoresis or radiation to the arm or jaw.  Patient has no prior cardiac history, but did have a negative stress test in 2016.  Patient's risk factors include hypertension, hyperlipidemia, and family history with a sibling having had a stent and mother having had bypass surgery.  The history is provided by the patient.  Chest Pain Pain location:  Substernal area Pain quality: pressure   Pain radiates to:  Does not radiate Pain severity:  Mild Onset quality:  Sudden Timing:  Constant Progression:  Improving Chronicity:  New     Past Medical History:  Diagnosis Date   Anxiety    Arthritis    Back pain    Edema of both lower extremities    Fluid retention    Food allergy    GERD (gastroesophageal reflux disease)    Hyperlipidemia    Hypertension    Knee pain    LBBB (left bundle branch block)    Osteoarthritis    Pre-diabetes    Shoulder pain    Sleep apnea    possibly   Spinal stenosis     Patient Active Problem List   Diagnosis Date Noted   Prediabetes 12/13/2019   Vitamin D deficiency 12/13/2019   Hyperlipidemia LDL goal <100 12/12/2019   Chronic pain of right knee 11/29/2019   Unilateral primary osteoarthritis, right knee 11/29/2019   Morbid obesity ([place]) 08/17/2019   Chest pain, rule out acute myocardial infarction 04/20/2017   Chronic pain of left knee 04/15/2017   Unilateral primary osteoarthritis, left  knee 04/15/2017   Hypertension    Chest pain with low risk for cardiac etiology 06/17/2014   Degenerative arthritis of hip 04/04/2011    Past Surgical History:  Procedure Laterality Date   ABDOMINAL HYSTERECTOMY  1970's   ABDOMINAL HYSTERECTOMY     APPENDECTOMY  1970's   with hysterectomy   APPENDECTOMY     with hysterectomy   COLONOSCOPY     COLONOSCOPY WITH PROPOFOL N/A 10/04/2013   Procedure: COLONOSCOPY WITH PROPOFOL;  Surgeon: Garlan Fair, MD;  Location: [facility];  Service: Endoscopy;  Laterality: N/A;   OOPHORECTOMY     TOTAL HIP ARTHROPLASTY  04/04/2011   Procedure: TOTAL HIP ARTHROPLASTY ANTERIOR APPROACH;  Surgeon: Mcarthur Rossetti, MD;  Location: [facility];  Service: Orthopedics;  Laterality: Right;   TOTAL HIP ARTHROPLASTY       OB History     Gravida  3   Para  3   Term  0   Preterm  0   AB  0   Living         SAB  0   IAB  0   Ectopic  0   Multiple      Live Births              Family History  Problem Relation Age of Onset   Hypertension Mother    Heart  disease Mother    Diabetes Mother    Obesity Mother    Hypertension Father    Heart disease Father    Heart attack Father    High Cholesterol Father    Depression Father    Anxiety disorder Father    Alcoholism Father    Prostate cancer Brother    Heart attack Brother    Heart disease Brother     Social History   Tobacco Use   Smoking status: Former    Packs/day: 1.00    Years: 20.00    Pack years: 20.00    Types: Cigarettes    Quit date: 05/23/2011    Years since quitting: 9.3   Smokeless tobacco: Never  Substance Use Topics   Alcohol use: No    Comment: occassional   Drug use: No    Home Medications Prior to Admission medications   Medication Sig Start Date End Date Taking? Authorizing Provider  acetaminophen (TYLENOL) 650 MG CR tablet Take 650 mg by mouth every 8 (eight) hours as needed for pain.    [provider]  amLODipine (NORVASC) 5 MG  tablet Take 5 mg by mouth every morning.    [provider]  atorvastatin (LIPITOR) 10 MG tablet Take 10 mg by mouth daily. 02/24/17   [provider]  carvedilol (COREG) 12.5 MG tablet Take 1 tablet (12.5 mg total) by mouth 2 (two) times daily with a meal. 06/18/14   Barrett, Evelene Croon, PA-C  furosemide (LASIX) 20 MG tablet Take 20 mg by mouth 2 (two) times daily.    [provider]  Vitamin D, Ergocalciferol, (DRISDOL) 1.25 MG (50000 UNIT) CAPS capsule Take 1 capsule (50,000 Units total) by mouth every 7 (seven) days. 02/23/20   Georgia Lopes, DO    Allergies    Kiwi extract and Pineapple  Review of Systems   Review of Systems  Cardiovascular:  Positive for chest pain.  All other systems reviewed and are negative.  Physical Exam Updated Vital Signs BP (!) 145/66   Pulse 63   Temp 98.1 F (36.7 C) (Oral)   Resp 16   Ht '5\' 4"'$  (1.626 m)   Wt 113.4 kg   SpO2 94%   BMI 42.91 kg/m   Physical Exam Vitals and nursing note reviewed.  Constitutional:      General: She is not in acute distress.    Appearance: She is well-developed. She is not diaphoretic.  HENT:     Head: Normocephalic and atraumatic.  Cardiovascular:     Rate and Rhythm: Normal rate and regular rhythm.     Heart sounds: No murmur heard.   No friction rub. No gallop.  Pulmonary:     Effort: Pulmonary effort is normal. No respiratory distress.     Breath sounds: Normal breath sounds. No wheezing.  Abdominal:     General: Bowel sounds are normal. There is no distension.     Palpations: Abdomen is soft.     Tenderness: There is no abdominal tenderness.  Musculoskeletal:        General: Normal range of motion.     Cervical back: Normal range of motion and neck supple.     Right lower leg: Edema present.     Left lower leg: Edema present.     Comments: There is 1+ pitting edema of both lower extremities.  Skin:    General: Skin is warm and dry.  Neurological:     General: No focal  deficit present.  Mental Status: She is alert and oriented to person, place, and time.    ED Results / Procedures / Treatments   Labs (all labs ordered are listed, but only abnormal results are displayed) Labs Reviewed  COMPREHENSIVE METABOLIC PANEL  LIPASE, BLOOD  CBC WITH DIFFERENTIAL/PLATELET  TROPONIN I (HIGH SENSITIVITY)    EKG EKG Interpretation  Date/Time:  Sunday September 30 2020 01:56:10 EDT Ventricular Rate:  70 PR Interval:  140 QRS Duration: 136 QT Interval:  435 QTC Calculation: 470 R Axis:   -5 Text Interpretation: Sinus rhythm Left bundle branch block Confirmed by Veryl Speak [phone]) on 09/30/2020 2:10:33 AM  Radiology No results found.  Procedures Procedures   Medications Ordered in ED Medications - No data to display  ED Course  I have reviewed the triage vital signs and the nursing notes.  Pertinent labs & imaging results that were available during my care of the patient were reviewed by me and considered in my medical decision making (see chart for details).    MDM Rules/Calculators/A&P  Patient presenting here with complaints of chest discomfort.  This started earlier this afternoon.  She does describe some belching, but no shortness of breath or diaphoresis.  Patient's work-up is unremarkable including EKG x2 and troponin x2.  At this point, I feel as though patient can safely be discharged, but will arrange an outpatient follow-up appointment with cardiology for a stress test.  It has been 6 years since her last evaluation and I feel as though this is warranted.  In the meantime, patient understands to return if her symptoms worsen or change.  Final Clinical Impression(s) / ED Diagnoses Final diagnoses:  Pain    Rx / DC Orders ED Discharge Orders     None        Veryl Speak, MD 09/30/20 [phone]

## 2020-09-30 NOTE — ED Triage Notes (Signed)
Pt BIB [facility], c/o chest pain that started earlier in the day, tried otc meds for indigestion with no relief. EKG abnormalities noted by EMS, given 1NTG with relief of pain, pain has started to return on arrival to ED.

## 2020-10-10 ENCOUNTER — Encounter (HOSPITAL_BASED_OUTPATIENT_CLINIC_OR_DEPARTMENT_OTHER): Payer: Self-pay | Admitting: Internal Medicine

## 2020-10-10 ENCOUNTER — Ambulatory Visit (HOSPITAL_BASED_OUTPATIENT_CLINIC_OR_DEPARTMENT_OTHER): Payer: Medicare Other | Admitting: Internal Medicine

## 2020-10-10 ENCOUNTER — Other Ambulatory Visit: Payer: Self-pay

## 2020-10-10 VITALS — BP 140/80 | HR 74 | Ht 65.0 in | Wt 252.6 lb

## 2020-10-10 DIAGNOSIS — I10 Essential (primary) hypertension: Secondary | ICD-10-CM | POA: Diagnosis not present

## 2020-10-10 DIAGNOSIS — R0789 Other chest pain: Secondary | ICD-10-CM

## 2020-10-10 DIAGNOSIS — E785 Hyperlipidemia, unspecified: Secondary | ICD-10-CM

## 2020-10-10 DIAGNOSIS — I2510 Atherosclerotic heart disease of native coronary artery without angina pectoris: Secondary | ICD-10-CM

## 2020-10-10 NOTE — Patient Instructions (Signed)
Medication Instructions:  STOP aspirin '325mg'$  as needed for pain START aspirin '81mg'$  daily   *If you need a refill on your cardiac medications before your next appointment, please call your pharmacy*   Lab Work: FASTING lipid panel to check cholesterol  If you have labs (blood work) drawn today and your tests are completely normal, you will receive your results only by: [place] (if you have MyChart) OR A paper copy in the mail If you have any lab test that is abnormal or we need to change your treatment, we will call you to review the results.   Testing/Procedures: NONE   Follow-Up: At [facility], you and your health needs are our priority.  As part of our continuing mission to provide you with exceptional heart care, we have created designated Provider Care Teams.  These Care Teams include your primary Cardiologist (physician) and Advanced Practice Providers (APPs -  Physician Assistants and Nurse Practitioners) who all work together to provide you with the care you need, when you need it.  We recommend signing up for the patient portal called "MyChart".  Sign up information is provided on this After Visit Summary.  MyChart is used to connect with patients for Virtual Visits (Telemedicine).  Patients are able to view lab/test results, encounter notes, upcoming appointments, etc.  Non-urgent messages can be sent to your provider as well.   To learn more about what you can do with MyChart, go to [email or web address].    Your next appointment:   6 month(s)  The format for your next appointment:   In Person  Provider:   Lyman Bishop MD or APP on his care team -- Almyra Deforest PA -- Roby Lofts PA

## 2020-10-10 NOTE — Progress Notes (Signed)
NEW PATIENT NOTE  Chief Complaint:  Chest pain  Primary Care Physician: Lajean Manes, MD  Primary Cardiologist:  None  HPI:  [name] is a 75 y.o. female who is being seen today for the evaluation of chest pain at the request of Lajean Manes, MD. this is a pleasant 75 year old female recently seen in the emergency department for chest pain.  She has a history of chest pain in the past and has a known left bundle branch block.  She was previously seeing Dr. Wynonia Lawman and underwent work-up for this including a stress test in 2016 which was negative for ischemia.  In 2019 she was hospitalized and had chest pain and seen by Dr. Sallyanne Kuster.  Echo showed normal systolic function.  She had a coronary artery CT scan which showed low coronary calcium score of 6, 45th percentile for age and sex matched control.  Normal coronary origins.  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  This is quite reassuring.  She had not been seen again since 2019 and is considered a new patient.  Her current episode of chest pain occurred at rest.  She said it was a burning pain in the center of her chest.  It took several hours to resolve spontaneously.  It has not recurred since hospitalization.  EKG was personally reviewed demonstrating sinus rhythm with left bundle branch block and no ischemic changes.  Troponin was negative x2.  She was discharged with cardiology follow-up.  She was told apparently by the EMS personnel that she had dextrocardia.  Its not clear to me where this came from.  Perhaps they were saying that they performed a right sided EKG which apparently they did however I personally reviewed her imaging and she has a levo-rotated heart which is normal.  PMHx:  Past Medical History:  Diagnosis Date   Anxiety    Arthritis    Back pain    Edema of both lower extremities    Fluid retention    Food allergy    GERD (gastroesophageal reflux disease)    Hyperlipidemia     Hypertension    Knee pain    LBBB (left bundle branch block)    Osteoarthritis    Pre-diabetes    Shoulder pain    Sleep apnea    possibly   Spinal stenosis     Past Surgical History:  Procedure Laterality Date   ABDOMINAL HYSTERECTOMY  1970's   ABDOMINAL HYSTERECTOMY     APPENDECTOMY  1970's   with hysterectomy   APPENDECTOMY     with hysterectomy   COLONOSCOPY     COLONOSCOPY WITH PROPOFOL N/A 10/04/2013   Procedure: COLONOSCOPY WITH PROPOFOL;  Surgeon: Garlan Fair, MD;  Location: [facility];  Service: Endoscopy;  Laterality: N/A;   OOPHORECTOMY     TOTAL HIP ARTHROPLASTY  04/04/2011   Procedure: TOTAL HIP ARTHROPLASTY ANTERIOR APPROACH;  Surgeon: Mcarthur Rossetti, MD;  Location: [facility];  Service: Orthopedics;  Laterality: Right;   TOTAL HIP ARTHROPLASTY      FAMHx:  Family History  Problem Relation Age of Onset   Hypertension Mother    Heart disease Mother    Diabetes Mother    Obesity Mother    Hypertension Father    Heart disease Father    Heart attack Father    High Cholesterol Father    Depression Father    Anxiety disorder Father    Alcoholism Father    Prostate cancer  Brother    Heart attack Brother    Heart disease Brother     SOCHx:   reports that she quit smoking about 9 years ago. Her smoking use included cigarettes. She has a 20.00 pack-year smoking history. She has never used smokeless tobacco. She reports that she does not drink alcohol and does not use drugs.  ALLERGIES:  Allergies  Allergen Reactions   Kiwi Extract Itching   Pineapple Itching    ROS: Pertinent items noted in HPI and remainder of comprehensive ROS otherwise negative.  HOME MEDS: Current Outpatient Medications on File Prior to Visit  Medication Sig Dispense Refill   acetaminophen (TYLENOL) 650 MG CR tablet Take 650 mg by mouth every 8 (eight) hours as needed for pain.     amLODipine (NORVASC) 5 MG tablet Take 5 mg by mouth every morning.     Ascorbic Acid  (VITAMIN C) 1000 MG tablet Take 1,000 mg by mouth daily.     aspirin 325 MG EC tablet Take 325 mg by mouth every 4 (four) hours as needed for pain.     carvedilol (COREG) 12.5 MG tablet Take 1 tablet (12.5 mg total) by mouth 2 (two) times daily with a meal. 60 tablet 11   cholecalciferol (VITAMIN D) 25 MCG (1000 UNIT) tablet Take 1,000 Units by mouth daily.     furosemide (LASIX) 20 MG tablet Take 20 mg by mouth 2 (two) times daily.     lisinopril (ZESTRIL) 20 MG tablet Take 40 mg by mouth daily.     zinc gluconate 50 MG tablet Take 50 mg by mouth daily.     Vitamin D, Ergocalciferol, (DRISDOL) 1.25 MG (50000 UNIT) CAPS capsule Take 1 capsule (50,000 Units total) by mouth every 7 (seven) days. (Patient not taking: Reported on 10/10/2020) 4 capsule 0   No current facility-administered medications on file prior to visit.    LABS/IMAGING: No results found for this or any previous visit (from the past 48 hour(s)). No results found.  LIPID PANEL:    Component Value Date/Time   CHOL 189 12/12/2019 1039   TRIG 90 12/12/2019 1039   HDL 41 12/12/2019 1039   CHOLHDL 4.6 04/20/2017 0929   VLDL 15 04/20/2017 0929   LDLCALC 131 (H) 12/12/2019 1039    WEIGHTS: Wt Readings from Last 3 Encounters:  10/10/20 252 lb 9.6 oz (114.6 kg)  09/30/20 250 lb (113.4 kg)  03/20/20 246 lb (111.6 kg)    VITALS: BP 140/80   Pulse 74   Ht '5\' 5"'$  (1.651 m)   Wt 252 lb 9.6 oz (114.6 kg)   SpO2 98%   BMI 42.03 kg/m   EXAM: General appearance: alert and no distress Neck: no carotid bruit, no JVD, and thyroid not enlarged, symmetric, no tenderness/mass/nodules Lungs: clear to auscultation bilaterally Heart: regular rate and rhythm, S1, S2 normal, no murmur, click, rub or gallop Abdomen: soft, non-tender; bowel sounds normal; no masses,  no organomegaly Extremities: extremities normal, atraumatic, no cyanosis or edema Pulses: 2+ and symmetric Skin: Skin color, texture, turgor normal. No rashes or  lesions Neurologic: Grossly normal Psych: Pleasant  EKG: N/A  ASSESSMENT: Atypical chest pain Family history of premature coronary disease CAD with minimal disease of the proximal LAD, CAC score of 6 (2019) by CT coronary angiogram Hypertension Dyslipidemia  PLAN: 1.   Mrs. Barrineau is describing somewhat atypical chest pain.  Is reassuring that she had a low calcium score with very minimal coronary disease by coronary CT in 2019  especially given her age in the late 90s at that time.  Blood pressure is well controlled at home.  She is on ACE inhibitor and beta-blocker.  She uses aspirin for pain relief but I think it would be beneficial for her to be on low-dose aspirin 81 mg daily.  In addition her treat lipids are untreated.  Her last LDL cholesterol was 122 in November 2021.  Would recommend repeat lipids now and target to LDL less than 70.  She will likely need to be on a statin.  She is working on weight loss and hopes to get a left knee replacement surgery in the future.  Right now she is using a cane which could also contributed to her chest discomfort.  Plan follow-up with me in 6 months or sooner as necessary.  Thanks again for the kind referral.  Pixie Casino, MD, FACC, [place] Director of the [facility] Lip[facility]  [facility] Diplomate of the American Board of Clinical Lipidology Attending Cardiologist  Direct Dial: [phone]  Fax: [phone]  Website:  [email or web address] [email or web address] Khamil Lamica 10/10/2020, 10:29 AM

## 2020-10-12 DIAGNOSIS — E785 Hyperlipidemia, unspecified: Secondary | ICD-10-CM | POA: Diagnosis not present

## 2020-10-13 LAB — LIPID PANEL
Chol/HDL Ratio: 5.1 ratio — ABNORMAL HIGH (ref 0.0–4.4)
Cholesterol, Total: 199 mg/dL (ref 100–199)
HDL: 39 mg/dL — ABNORMAL LOW (ref >39)
LDL Chol Calc (NIH): 138 mg/dL — ABNORMAL HIGH (ref 0–99)
Triglycerides: 120 mg/dL (ref 0–149)
VLDL Cholesterol Cal: 22 mg/dL (ref 5–40)

## 2020-10-17 ENCOUNTER — Other Ambulatory Visit: Payer: Self-pay | Admitting: *Deleted

## 2020-10-17 DIAGNOSIS — E785 Hyperlipidemia, unspecified: Secondary | ICD-10-CM

## 2020-10-17 MED ORDER — ROSUVASTATIN CALCIUM 20 MG PO TABS: 20.0000 mg | ORAL_TABLET | Freq: Every day | ORAL | 3 refills | Status: DC

## 2020-11-23 ENCOUNTER — Telehealth: Payer: Self-pay

## 2020-11-23 NOTE — Telephone Encounter (Signed)
Patient will like a Rx for pain sent to her pharmacy.  Cb# [phone].  Please advise.

## 2020-11-23 NOTE — Telephone Encounter (Signed)
Error

## 2020-11-26 NOTE — Telephone Encounter (Signed)
Please advise 

## 2020-11-26 NOTE — Telephone Encounter (Signed)
I called the pt and advised. She stated understanding

## 2020-12-10 ENCOUNTER — Ambulatory Visit (INDEPENDENT_AMBULATORY_CARE_PROVIDER_SITE_OTHER): Payer: Medicare Other | Admitting: Orthopaedic Surgery

## 2020-12-10 ENCOUNTER — Other Ambulatory Visit: Payer: Self-pay

## 2020-12-10 ENCOUNTER — Encounter: Payer: Self-pay | Admitting: Orthopaedic Surgery

## 2020-12-10 VITALS — Ht 65.0 in | Wt 248.8 lb

## 2020-12-10 DIAGNOSIS — M1711 Unilateral primary osteoarthritis, right knee: Secondary | ICD-10-CM

## 2020-12-10 DIAGNOSIS — M1712 Unilateral primary osteoarthritis, left knee: Secondary | ICD-10-CM | POA: Diagnosis not present

## 2020-12-10 DIAGNOSIS — M25561 Pain in right knee: Secondary | ICD-10-CM

## 2020-12-10 DIAGNOSIS — M25562 Pain in left knee: Secondary | ICD-10-CM

## 2020-12-10 DIAGNOSIS — G8929 Other chronic pain: Secondary | ICD-10-CM

## 2020-12-10 MED ORDER — LIDOCAINE HCL 1 % IJ SOLN
3.0000 mL | INTRAMUSCULAR | Status: AC | PRN
  Administered 2020-12-10: 3 mL

## 2020-12-10 MED ORDER — METHYLPREDNISOLONE ACETATE 40 MG/ML IJ SUSP
40.0000 mg | INTRAMUSCULAR | Status: AC | PRN
  Administered 2020-12-10: 40 mg via INTRA_ARTICULAR

## 2020-12-10 MED ORDER — METHYLPREDNISOLONE ACETATE 40 MG/ML IJ SUSP
40.0000 mg | INTRAMUSCULAR | Status: AC | PRN
Start: 2020-12-10 — End: 2020-12-10
  Administered 2020-12-10: 40 mg via INTRA_ARTICULAR

## 2020-12-10 NOTE — Progress Notes (Signed)
Office Visit Note   Patient: [name]           Date of Birth: 1945-11-08           MRN: [id number] Visit Date: 12/10/2020              Requested by: Lajean Manes, MD [address] [place],  [place] [address] PCP: Lajean Manes, MD   Assessment & Plan: Visit Diagnoses:  1. Unilateral primary osteoarthritis, left knee   2. Unilateral primary osteoarthritis, right knee   3. Severe obesity (BMI >= 40) (HCC)   4. Chronic pain of left knee   5. Chronic pain of right knee     Plan: I have continue to recommend weight loss for her.  I did recommend a steroid injection in both knees today and she actually asked for these and agreed to this and tolerated it well.  I would like to see her back in 3 months for repeat weight and BMI calculation.  Follow-Up Instructions: Return in about 3 months (around 03/12/2021).   Orders:  No orders of the defined types were placed in this encounter.  No orders of the defined types were placed in this encounter.     Procedures: Large Joint Inj: R knee on 12/10/2020 2:01 PM Indications: diagnostic evaluation and pain Details: 22 G 1.5 in needle, superolateral approach  Arthrogram: No  Medications: 3 mL lidocaine 1 %; 40 mg methylPREDNISolone acetate 40 MG/ML Outcome: tolerated well, no immediate complications Procedure, treatment alternatives, risks and benefits explained, specific risks discussed. Consent was given by the patient. Immediately prior to procedure a time out was called to verify the correct patient, procedure, equipment, support staff and site/side marked as required. Patient was prepped and draped in the usual sterile fashion.    Large Joint Inj: L knee on 12/10/2020 2:01 PM Indications: diagnostic evaluation and pain Details: 22 G 1.5 in needle, superolateral approach  Arthrogram: No  Medications: 3 mL lidocaine 1 %; 40 mg methylPREDNISolone acetate 40 MG/ML Outcome: tolerated well, no immediate  complications Procedure, treatment alternatives, risks and benefits explained, specific risks discussed. Consent was given by the patient. Immediately prior to procedure a time out was called to verify the correct patient, procedure, equipment, support staff and site/side marked as required. Patient was prepped and draped in the usual sterile fashion.      Clinical Data: No additional findings.   Subjective: Chief Complaint  Patient presents with   Right Hip - Pain, Follow-up   Right Knee - Pain, Follow-up  The patient is well-known to me.  She has well-documented severe end-stage arthritis in both her knees.  Her right knee hurts worse than the left knee.  This is caused her right hip to hurt.  I replaced her right hip in 2013.  She does feel like the right hip pain is coming from her knees.  Her BMI is 41.4.  When I saw her earlier this year in January it was 66 as well so she has not been able to lose weight.  She has dealt with COVID and other issues that has affected her and allowed her to not lose weight.  She has been to weight loss clinic.  She is not a diabetic.  She does take Tylenol but has not helped.  HPI  Review of Systems Today there is no listed headache, chest pain, shortness of breath, fever, chills, nausea, vomiting  Objective: Vital Signs: Ht 5\' 5"  (1.651 m)  Wt 248 lb 12.8 oz (112.9 kg)   BMI 41.40 kg/m   Physical Exam She is alert and orient x3 and in no acute distress Ortho Exam Examination of both knees shows varus malalignment with significant patellofemoral crepitation and global pain throughout the arc of motion of both knees. Specialty Comments:  No specialty comments available.  Imaging: No results found.   PMFS History: Patient Active Problem List   Diagnosis Date Noted   Prediabetes 12/13/2019   Vitamin D deficiency 12/13/2019   Hyperlipidemia LDL goal <100 12/12/2019   Chronic pain of right knee 11/29/2019   Unilateral primary  osteoarthritis, right knee 11/29/2019   Morbid obesity ([place]) 08/17/2019   Chest pain, rule out acute myocardial infarction 04/20/2017   Chronic pain of left knee 04/15/2017   Unilateral primary osteoarthritis, left knee 04/15/2017   Hypertension    Chest pain with low risk for cardiac etiology 06/17/2014   Degenerative arthritis of hip 04/04/2011   Past Medical History:  Diagnosis Date   Anxiety    Arthritis    Back pain    Edema of both lower extremities    Fluid retention    Food allergy    GERD (gastroesophageal reflux disease)    Hyperlipidemia    Hypertension    Knee pain    LBBB (left bundle branch block)    Osteoarthritis    Pre-diabetes    Shoulder pain    Sleep apnea    possibly   Spinal stenosis     Family History  Problem Relation Age of Onset   Hypertension Mother    Heart disease Mother    Diabetes Mother    Obesity Mother    Hypertension Father    Heart disease Father    Heart attack Father    High Cholesterol Father    Depression Father    Anxiety disorder Father    Alcoholism Father    Prostate cancer Brother    Heart attack Brother    Heart disease Brother     Past Surgical History:  Procedure Laterality Date   ABDOMINAL HYSTERECTOMY  1970's   ABDOMINAL HYSTERECTOMY     APPENDECTOMY  1970's   with hysterectomy   APPENDECTOMY     with hysterectomy   COLONOSCOPY     COLONOSCOPY WITH PROPOFOL N/A 10/04/2013   Procedure: COLONOSCOPY WITH PROPOFOL;  Surgeon: Garlan Fair, MD;  Location: [facility];  Service: Endoscopy;  Laterality: N/A;   OOPHORECTOMY     TOTAL HIP ARTHROPLASTY  04/04/2011   Procedure: TOTAL HIP ARTHROPLASTY ANTERIOR APPROACH;  Surgeon: Mcarthur Rossetti, MD;  Location: [facility];  Service: Orthopedics;  Laterality: Right;   TOTAL HIP ARTHROPLASTY     Social History   Occupational History   Occupation: Dialysis Tech, retired  Tobacco Use   Smoking status: Former    Packs/day: 1.00    Years: 20.00    Pack years:  20.00    Types: Cigarettes    Quit date: 05/23/2011    Years since quitting: 9.5   Smokeless tobacco: Never  Substance and Sexual Activity   Alcohol use: No    Comment: occassional   Drug use: No   Sexual activity: Not on file

## 2020-12-27 DIAGNOSIS — E7800 Pure hypercholesterolemia, unspecified: Secondary | ICD-10-CM | POA: Diagnosis not present

## 2020-12-27 DIAGNOSIS — R7303 Prediabetes: Secondary | ICD-10-CM | POA: Diagnosis not present

## 2020-12-27 DIAGNOSIS — Z79899 Other long term (current) drug therapy: Secondary | ICD-10-CM | POA: Diagnosis not present

## 2020-12-27 DIAGNOSIS — Z23 Encounter for immunization: Secondary | ICD-10-CM | POA: Diagnosis not present

## 2020-12-27 DIAGNOSIS — I10 Essential (primary) hypertension: Secondary | ICD-10-CM | POA: Diagnosis not present

## 2020-12-27 DIAGNOSIS — Z1389 Encounter for screening for other disorder: Secondary | ICD-10-CM | POA: Diagnosis not present

## 2021-01-09 DIAGNOSIS — I10 Essential (primary) hypertension: Secondary | ICD-10-CM | POA: Diagnosis not present

## 2021-01-28 DIAGNOSIS — I10 Essential (primary) hypertension: Secondary | ICD-10-CM | POA: Diagnosis not present

## 2021-01-28 DIAGNOSIS — K219 Gastro-esophageal reflux disease without esophagitis: Secondary | ICD-10-CM | POA: Diagnosis not present

## 2021-01-28 DIAGNOSIS — E7800 Pure hypercholesterolemia, unspecified: Secondary | ICD-10-CM | POA: Diagnosis not present

## 2021-03-05 DIAGNOSIS — I471 Supraventricular tachycardia: Secondary | ICD-10-CM | POA: Diagnosis not present

## 2021-03-05 DIAGNOSIS — N1831 Chronic kidney disease, stage 3a: Secondary | ICD-10-CM | POA: Diagnosis not present

## 2021-03-05 DIAGNOSIS — I129 Hypertensive chronic kidney disease with stage 1 through stage 4 chronic kidney disease, or unspecified chronic kidney disease: Secondary | ICD-10-CM | POA: Diagnosis not present

## 2021-03-05 DIAGNOSIS — R7303 Prediabetes: Secondary | ICD-10-CM | POA: Diagnosis not present

## 2021-04-29 ENCOUNTER — Ambulatory Visit: Payer: Medicare Other | Admitting: Internal Medicine

## 2021-05-03 ENCOUNTER — Encounter: Payer: Self-pay | Admitting: Internal Medicine

## 2021-06-04 DIAGNOSIS — E7800 Pure hypercholesterolemia, unspecified: Secondary | ICD-10-CM | POA: Diagnosis not present

## 2021-06-04 DIAGNOSIS — N1831 Chronic kidney disease, stage 3a: Secondary | ICD-10-CM | POA: Diagnosis not present

## 2021-06-04 DIAGNOSIS — I10 Essential (primary) hypertension: Secondary | ICD-10-CM | POA: Diagnosis not present

## 2021-06-04 DIAGNOSIS — K219 Gastro-esophageal reflux disease without esophagitis: Secondary | ICD-10-CM | POA: Diagnosis not present

## 2021-08-09 DIAGNOSIS — N1831 Chronic kidney disease, stage 3a: Secondary | ICD-10-CM | POA: Diagnosis not present

## 2021-08-09 DIAGNOSIS — I129 Hypertensive chronic kidney disease with stage 1 through stage 4 chronic kidney disease, or unspecified chronic kidney disease: Secondary | ICD-10-CM | POA: Diagnosis not present

## 2021-08-09 DIAGNOSIS — R7303 Prediabetes: Secondary | ICD-10-CM | POA: Diagnosis not present

## 2021-08-19 ENCOUNTER — Telehealth: Payer: Self-pay | Admitting: Orthopaedic Surgery

## 2021-08-19 NOTE — Telephone Encounter (Signed)
Please get auth thank you

## 2021-08-19 NOTE — Telephone Encounter (Signed)
Ok to order 

## 2021-08-19 NOTE — Telephone Encounter (Signed)
Noted  

## 2021-08-19 NOTE — Telephone Encounter (Signed)
Patient called asked if she can get the gel injection in her left knee? The number to contact patient is [phone]

## 2021-09-03 ENCOUNTER — Ambulatory Visit: Payer: Medicare Other | Admitting: Orthopaedic Surgery

## 2021-09-03 VITALS — Ht 65.0 in | Wt 243.0 lb

## 2021-09-03 DIAGNOSIS — M1712 Unilateral primary osteoarthritis, left knee: Secondary | ICD-10-CM | POA: Diagnosis not present

## 2021-09-03 DIAGNOSIS — G8929 Other chronic pain: Secondary | ICD-10-CM

## 2021-09-03 DIAGNOSIS — M1711 Unilateral primary osteoarthritis, right knee: Secondary | ICD-10-CM | POA: Diagnosis not present

## 2021-09-03 DIAGNOSIS — M25562 Pain in left knee: Secondary | ICD-10-CM

## 2021-09-03 DIAGNOSIS — M25551 Pain in right hip: Secondary | ICD-10-CM | POA: Diagnosis not present

## 2021-09-03 NOTE — Progress Notes (Signed)
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  She does ambulate the cane but is gotten to the point where she needs a rolling walker.  She is 76 years old.  She has had all forms conservative treatment for this left knee and is in need of knee replacement surgery.  However her BMI has been over 40.  She is now on a new diet regimen.  Today her BMI is 40.44.  Her left knee on exam shows varus malalignment with significant patellofemoral crepitation.  At this point she wants to hold off on any type of steroid injection anymore and I agree with this given that it has elevated her blood glucose in the past and she still working on weight loss.  She did ask for prescription for rolling walker and I agree with this and gave her prescription for DME to take a medical supply store.  I would like to see her back in just 6 weeks with a repeat weight and BMI calculation.  I would like a new AP and lateral of her left knee at that visit.  Of note, her right hip exam is entirely normal with pain only over the trochanteric area.  The hip joint itself moves smoothly and fluidly.  I believe this is from compensating taking weight off of her left knee that is creating a balance and gait difficulty problem which is creating bursitis.

## 2021-09-05 NOTE — Telephone Encounter (Signed)
Called and advised. Pt was scheduled  

## 2021-09-05 NOTE — Telephone Encounter (Signed)
Approved for Durolane-left  knee  Buy and bill No copay Covered @ 40% No precert required

## 2021-09-11 ENCOUNTER — Ambulatory Visit: Payer: Medicare Other | Admitting: Physician Assistant

## 2021-09-11 ENCOUNTER — Encounter: Payer: Self-pay | Admitting: Physician Assistant

## 2021-09-11 DIAGNOSIS — M1711 Unilateral primary osteoarthritis, right knee: Secondary | ICD-10-CM

## 2021-09-11 DIAGNOSIS — M1712 Unilateral primary osteoarthritis, left knee: Secondary | ICD-10-CM | POA: Diagnosis not present

## 2021-09-11 DIAGNOSIS — M170 Bilateral primary osteoarthritis of knee: Secondary | ICD-10-CM | POA: Diagnosis not present

## 2021-09-11 MED ORDER — LIDOCAINE HCL 1 % IJ SOLN
4.0000 mL | INTRAMUSCULAR | Status: AC | PRN
  Administered 2021-09-11: 4 mL

## 2021-09-11 MED ORDER — SODIUM HYALURONATE 60 MG/3ML IX PRSY
60.0000 mg | PREFILLED_SYRINGE | INTRA_ARTICULAR | Status: AC | PRN
  Administered 2021-09-11: 60 mg via INTRA_ARTICULAR

## 2021-09-11 NOTE — Progress Notes (Signed)
   Procedure Note  Patient: [name]             Date of Birth: 1945/12/23           MRN: [id number]             Visit Date: 09/11/2021 HPI: [name] comes in today for scheduled Durolane injections both knees.  She has severe end-stage arthritis of the left knee and severe right knee arthritis.  She does not tolerate cortisone injections in her knees very well due to the fact that it causes her glucose levels to rise and causes headache.  She denies any recent injuries or falls to either knee.  Currently left knee is bothering more than the right.  She has no scheduled knee surgery in the next 6 months.  Physical exam: Bilateral knees show varus malalignment.  No abnormal warmth erythema of either knee.  Slight effusion left knee.  No effusion right knee.  Patellofemoral crepitus with range of motion both knees.  Ambulates with a slow antalgic gait and the use of a cane.  Procedures: Visit Diagnoses:  1. Unilateral primary osteoarthritis, left knee   2. Unilateral primary osteoarthritis, right knee     Large Joint Inj: bilateral knee on 09/11/2021 5:59 PM Indications: pain Details: 22 G 1.5 in needle, superolateral approach  Arthrogram: No  Medications (Right): 60 mg Sodium Hyaluronate 60 MG/3ML Medications (Left): 60 mg Sodium Hyaluronate 60 MG/3ML; 4 mL lidocaine 1 % Aspirate (Left): 5 mL yellow Outcome: tolerated well, no immediate complications Procedure, treatment alternatives, risks and benefits explained, specific risks discussed. Consent was given by the patient. Immediately prior to procedure a time out was called to verify the correct patient, procedure, equipment, support staff and site/side marked as required. Patient was prepped and draped in the usual sterile fashion.    Plan: She will follow-up with [place] on an as-needed basis.  She knows to she needs to wait at least 6 months between injections.  Questions were encouraged and answered at length.

## 2021-09-13 ENCOUNTER — Other Ambulatory Visit: Payer: Self-pay | Admitting: Internal Medicine

## 2021-09-17 ENCOUNTER — Telehealth: Payer: Self-pay | Admitting: Physician Assistant

## 2021-09-17 NOTE — Telephone Encounter (Signed)
I discussed with gil and called and talked to the pt

## 2021-09-17 NOTE — Telephone Encounter (Signed)
Patient called advised her knees hurt worse after the injection. Patient said she has tried cold and heat. Patient said she is taking rapid release Tylenol. Patient said she haven't been out of the house. Patient asked for a call back as soon as possible. The number to contact patient is [phone]

## 2021-09-18 ENCOUNTER — Encounter (INDEPENDENT_AMBULATORY_CARE_PROVIDER_SITE_OTHER): Payer: Self-pay

## 2021-10-16 ENCOUNTER — Ambulatory Visit: Payer: Medicare Other | Admitting: Orthopaedic Surgery

## 2021-10-30 ENCOUNTER — Encounter: Payer: Self-pay | Admitting: Orthopaedic Surgery

## 2021-10-30 ENCOUNTER — Ambulatory Visit: Payer: Medicare Other | Admitting: Orthopaedic Surgery

## 2021-10-30 DIAGNOSIS — M1711 Unilateral primary osteoarthritis, right knee: Secondary | ICD-10-CM | POA: Diagnosis not present

## 2021-10-30 DIAGNOSIS — M1712 Unilateral primary osteoarthritis, left knee: Secondary | ICD-10-CM

## 2021-10-30 DIAGNOSIS — M25562 Pain in left knee: Secondary | ICD-10-CM

## 2021-10-30 DIAGNOSIS — M25561 Pain in right knee: Secondary | ICD-10-CM | POA: Diagnosis not present

## 2021-10-30 DIAGNOSIS — G8929 Other chronic pain: Secondary | ICD-10-CM

## 2021-10-30 NOTE — Progress Notes (Signed)
The patient is following up 6 weeks after having hyaluronic acid injections with Durolane in both knees to treat the pain from osteoarthritis.  She has tried failed other conservative treatment measures including steroid injections.  She has known tricompartment arthritis of both knees.  Both her shins been hurting and she feels like the injections are just starting to work.  We are refraining from any more steroid injections because how bad they make her feel.  Both knees have varus malalignment that is correctable.  Both knees have global tenderness mainly in the medial joint line.  She knows to wait at least 4 months to come see [place] before considering hyaluronic acid injections again.  When she does come in at her next visit, no x-rays are needed but we do need a weight and BMI calculation.  All questions and concerns were answered and addressed.  The next step if injections fail would be considering knee replacement surgery.  She understands this as well.

## 2021-12-13 ENCOUNTER — Other Ambulatory Visit: Payer: Self-pay | Admitting: Internal Medicine

## 2022-01-09 DIAGNOSIS — R197 Diarrhea, unspecified: Secondary | ICD-10-CM | POA: Diagnosis not present

## 2022-01-09 DIAGNOSIS — Z03818 Encounter for observation for suspected exposure to other biological agents ruled out: Secondary | ICD-10-CM | POA: Diagnosis not present

## 2022-01-09 DIAGNOSIS — R1084 Generalized abdominal pain: Secondary | ICD-10-CM | POA: Diagnosis not present

## 2022-01-29 DIAGNOSIS — E7800 Pure hypercholesterolemia, unspecified: Secondary | ICD-10-CM | POA: Diagnosis not present

## 2022-01-29 DIAGNOSIS — I10 Essential (primary) hypertension: Secondary | ICD-10-CM | POA: Diagnosis not present

## 2022-01-29 DIAGNOSIS — I471 Supraventricular tachycardia, unspecified: Secondary | ICD-10-CM | POA: Diagnosis not present

## 2022-01-29 DIAGNOSIS — Z79899 Other long term (current) drug therapy: Secondary | ICD-10-CM | POA: Diagnosis not present

## 2022-01-29 DIAGNOSIS — I2510 Atherosclerotic heart disease of native coronary artery without angina pectoris: Secondary | ICD-10-CM | POA: Diagnosis not present

## 2022-01-29 DIAGNOSIS — Z23 Encounter for immunization: Secondary | ICD-10-CM | POA: Diagnosis not present

## 2022-01-29 DIAGNOSIS — M8580 Other specified disorders of bone density and structure, unspecified site: Secondary | ICD-10-CM | POA: Diagnosis not present

## 2022-01-29 DIAGNOSIS — R7303 Prediabetes: Secondary | ICD-10-CM | POA: Diagnosis not present

## 2022-01-29 DIAGNOSIS — K219 Gastro-esophageal reflux disease without esophagitis: Secondary | ICD-10-CM | POA: Diagnosis not present

## 2022-01-29 DIAGNOSIS — Z1231 Encounter for screening mammogram for malignant neoplasm of breast: Secondary | ICD-10-CM | POA: Diagnosis not present

## 2022-01-29 DIAGNOSIS — Z0000 Encounter for general adult medical examination without abnormal findings: Secondary | ICD-10-CM | POA: Diagnosis not present

## 2022-02-06 ENCOUNTER — Other Ambulatory Visit: Payer: Self-pay | Admitting: Internal Medicine

## 2022-02-26 ENCOUNTER — Other Ambulatory Visit: Payer: Self-pay | Admitting: Internal Medicine

## 2022-02-26 DIAGNOSIS — M8580 Other specified disorders of bone density and structure, unspecified site: Secondary | ICD-10-CM

## 2022-03-03 ENCOUNTER — Other Ambulatory Visit: Payer: Self-pay

## 2022-03-03 ENCOUNTER — Ambulatory Visit: Payer: Medicare Other | Admitting: Orthopaedic Surgery

## 2022-03-03 VITALS — Wt 253.0 lb

## 2022-03-03 DIAGNOSIS — M25562 Pain in left knee: Secondary | ICD-10-CM | POA: Diagnosis not present

## 2022-03-03 DIAGNOSIS — M1712 Unilateral primary osteoarthritis, left knee: Secondary | ICD-10-CM | POA: Diagnosis not present

## 2022-03-03 DIAGNOSIS — G8929 Other chronic pain: Secondary | ICD-10-CM

## 2022-03-03 DIAGNOSIS — M1711 Unilateral primary osteoarthritis, right knee: Secondary | ICD-10-CM | POA: Diagnosis not present

## 2022-03-03 DIAGNOSIS — M5441 Lumbago with sciatica, right side: Secondary | ICD-10-CM

## 2022-03-03 DIAGNOSIS — M25561 Pain in right knee: Secondary | ICD-10-CM | POA: Diagnosis not present

## 2022-03-03 NOTE — Progress Notes (Signed)
Patient is 77 year old female well-known to me.  She has debilitating arthritis in both her knees with the left worse than the right.  She is continuing to be on a weight loss journey.  She is in need of knee replacement surgery.  She has had steroid injections and hyaluronic acid injections in her knees.  She does ambulate using a cane.  She did report that she has put on some weight since the holiday season.  On exam both knees have varus malalignment that is correctable.  Both knees have global tenderness with bone-on-bone wear specially of the medial compartment of both knees and patellofemoral joint.  Her BMI today is 42.1.  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  She did let me know that she has been developing some right-sided sciatica and it radiates down to her foot and ankle.  I would like to send her to outpatient physical therapy to work on [employer] exercises and addressing her right-sided sciatica as well as bilateral quad strengthening exercises.  I would like to see her back in 6 weeks with a repeat weight and BMI calculation.  She agrees with this treatment plan.

## 2022-03-10 ENCOUNTER — Ambulatory Visit: Payer: Medicare Other | Admitting: Physical Therapy

## 2022-03-13 ENCOUNTER — Other Ambulatory Visit: Payer: Self-pay | Admitting: Internal Medicine

## 2022-03-14 ENCOUNTER — Encounter: Payer: Self-pay | Admitting: Rehabilitative and Restorative Service Providers"

## 2022-03-14 ENCOUNTER — Other Ambulatory Visit: Payer: Self-pay

## 2022-03-14 ENCOUNTER — Ambulatory Visit: Payer: Medicare Other | Admitting: Rehabilitative and Restorative Service Providers"

## 2022-03-14 DIAGNOSIS — M6281 Muscle weakness (generalized): Secondary | ICD-10-CM | POA: Diagnosis not present

## 2022-03-14 DIAGNOSIS — M25562 Pain in left knee: Secondary | ICD-10-CM

## 2022-03-14 DIAGNOSIS — M5431 Sciatica, right side: Secondary | ICD-10-CM | POA: Diagnosis not present

## 2022-03-14 DIAGNOSIS — R262 Difficulty in walking, not elsewhere classified: Secondary | ICD-10-CM | POA: Diagnosis not present

## 2022-03-14 DIAGNOSIS — M25661 Stiffness of right knee, not elsewhere classified: Secondary | ICD-10-CM | POA: Diagnosis not present

## 2022-03-14 DIAGNOSIS — G8929 Other chronic pain: Secondary | ICD-10-CM

## 2022-03-14 DIAGNOSIS — M25662 Stiffness of left knee, not elsewhere classified: Secondary | ICD-10-CM | POA: Diagnosis not present

## 2022-03-14 DIAGNOSIS — M25561 Pain in right knee: Secondary | ICD-10-CM

## 2022-03-14 DIAGNOSIS — R293 Abnormal posture: Secondary | ICD-10-CM

## 2022-03-14 DIAGNOSIS — R600 Localized edema: Secondary | ICD-10-CM

## 2022-03-14 NOTE — Therapy (Signed)
OUTPATIENT PHYSICAL THERAPY LUMBAR SPINE & LOWER EXTREMITY EVALUATION   Patient Name: [name] MRN: [id number] DOB:08-15-1945, 77 y.o., female Today's Date: 03/14/2022  END OF SESSION:  PT End of Session - 03/14/22 1019     Visit Number 1    Number of Visits [place] [employer]    Authorization - Visit Number 1    Progress Note Due on Visit 10    PT Start Time 4270    PT Stop Time 1058    PT Time Calculation (min) 43 min    Activity Tolerance Patient tolerated treatment well;No increased pain;Patient limited by fatigue    Behavior During Therapy [facility] for tasks assessed/performed             Past Medical History:  Diagnosis Date   Anxiety    Arthritis    Back pain    Edema of both lower extremities    Fluid retention    Food allergy    GERD (gastroesophageal reflux disease)    Hyperlipidemia    Hypertension    Knee pain    LBBB (left bundle branch block)    Osteoarthritis    Pre-diabetes    Shoulder pain    Sleep apnea    possibly   Spinal stenosis    Past Surgical History:  Procedure Laterality Date   ABDOMINAL HYSTERECTOMY  1970's   ABDOMINAL HYSTERECTOMY     APPENDECTOMY  1970's   with hysterectomy   APPENDECTOMY     with hysterectomy   COLONOSCOPY     COLONOSCOPY WITH PROPOFOL N/A 10/04/2013   Procedure: COLONOSCOPY WITH PROPOFOL;  Surgeon: Garlan Fair, MD;  Location: [facility] ENDOSCOPY;  Service: Endoscopy;  Laterality: N/A;   OOPHORECTOMY     TOTAL HIP ARTHROPLASTY  04/04/2011   Procedure: TOTAL HIP ARTHROPLASTY ANTERIOR APPROACH;  Surgeon: Mcarthur Rossetti, MD;  Location: [facility] OR[facility];  Service: Orthopedics;  Laterality: Right;   TOTAL HIP ARTHROPLASTY     Patient Active Problem List   Diagnosis Date Noted   Prediabetes 12/13/2019   Vitamin D deficiency 12/13/2019   Hyperlipidemia LDL goal <100 12/12/2019   Chronic pain of right knee 11/29/2019   Unilateral primary osteoarthritis, right knee 11/29/2019   Morbid obesity  ([place]) 08/17/2019   Chest pain, rule out acute myocardial infarction 04/20/2017   Chronic pain of left knee 04/15/2017   Unilateral primary osteoarthritis, left knee 04/15/2017   Hypertension    Chest pain with low risk for cardiac etiology 06/17/2014   Degenerative arthritis of hip 04/04/2011    PCP: Lajean Manes, MD  REFERRING PROVIDER: Mcarthur Rossetti, MD  REFERRING DIAG: [phone] (ICD-10-CM) - Unilateral primary osteoarthritis, left knee M17.11 (ICD-10-CM) - Unilateral primary osteoarthritis, right knee M54.41,G89.29 (ICD-10-CM) - Chronic right-sided low back pain with right-sided sciatica  THERAPY DIAG:  Abnormal posture - Plan: PT plan of care cert/re-cert  Difficulty in walking, not elsewhere classified - Plan: PT plan of care cert/re-cert  Muscle weakness (generalized) - Plan: PT plan of care cert/re-cert  Localized edema - Plan: PT plan of care cert/re-cert  Chronic pain of left knee - Plan: PT plan of care cert/re-cert  Chronic pain of right knee - Plan: PT plan of care cert/re-cert  Stiffness of left knee, not elsewhere classified - Plan: PT plan of care cert/re-cert  Stiffness of right knee, not elsewhere classified - Plan: PT plan of care cert/re-cert  Sciatica, right side - Plan: PT plan of care cert/re-cert  Rationale for Evaluation and Treatment: Rehabilitation  ONSET DATE: Chronic knee pain  SUBJECTIVE:   SUBJECTIVE STATEMENT: [name] is looking to have her left knee replaced.  She wants to get in shape for surgery.  Right sciatica is intermittent.  Sciatica has not been bothering her this week.    PERTINENT HISTORY: OA, HLD, HTN, pre-diabetes, Bil shoulder pain, spinal stenosis, previous abdominal hysterectomy, previous Rt THA, morbid obesity, chronic Bil knee pain PAIN:  Are you having pain? Yes: NPRS scale: 3-5/10 bilateral knees, right sciatica 0-6/10 on a 10/10 Pain location: Left > Right knee, intermittent right sciatica Pain description:  Ache, burning, sore knees, sciatica is burning Aggravating factors: Prolonged postures Relieving factors: Nothing really helps  PRECAUTIONS: Back  WEIGHT BEARING RESTRICTIONS: No  FALLS:  Has patient fallen in last 6 months? No  LIVING ENVIRONMENT: Lives with: lives alone Lives in: House/apartment Stairs:  [name] reports she tries to avoid stairs, limited to 3-4 steps at a time Has following equipment at home: Single point cane  OCCUPATION: Retired Engineer, manufacturing  PLOF: Needs assistance with ADLs  PATIENT GOALS: Get stronger, lose weight, prepare for surgery.  NEXT MD VISIT: 6 weeks  OBJECTIVE:   DIAGNOSTIC FINDINGS: M17.12 (ICD-10-CM) - Unilateral primary osteoarthritis, left knee M17.11 (ICD-10-CM) - Unilateral primary osteoarthritis, right knee M54.41,G89.29 (ICD-10-CM) - Chronic right-sided low back pain with right-sided sciatica  PATIENT SURVEYS:  FOTO 32 (Goal 41 in 14 visits)  COGNITION: Overall cognitive status: Within functional limits for tasks assessed     SENSATION: Can get intermittent burning to the ankle, mostly to the knee on the right side.  No other tingling, numbness or paresthesias noted.  EDEMA:  Noted but not objectively assessed  POSTURE: rounded shoulders, forward head, and decreased lumbar lordosis   LOWER EXTREMITY ROM:  Active ROM Right 03/14/2022 Left 03/14/2022  Hip flexion    Hip extension    Hip abduction    Hip adduction    Hip internal rotation    Hip external rotation    Knee flexion 103 90  Knee extension -12 -18  Ankle dorsiflexion    Ankle plantarflexion    Ankle inversion/eversion    Lumbar extension 10    (Blank rows = not tested)  LOWER EXTREMITY STRENGTH:  In pounds with hand-held dynamometer Left/Right 03/14/2022   Hip flexion    Hip extension    Hip abduction    Hip adduction    Hip internal rotation    Hip external rotation    *Knee flexion    *Knee extension Deferred due to patient fatigue   Ankle  dorsiflexion    Ankle plantarflexion    Ankle inversion/eversion    *Lumbar extension     (Blank rows = not tested)  GAIT: Distance walked: In the clinic with a cane Assistive device utilized: Single point cane Level of assistance:  Use cane and is safe with a cane Comments: [name] has a walker but doesn't use it much due to bilateral shoulder pain   TODAY'S TREATMENT:  DATE: 03/14/2022 Shoulder blade pinches 10X 5 seconds Quadriceps sets (toes back, press knees down and tighten thighs) 10X 5 seconds Tailgate knee flexion AROM 2 minutes   Functional Activities: Reviewed exam findings, basic posture, the importance of avoiding prolonged postures (particularly sitting) and her day 1 HEP  PATIENT EDUCATION:  Education details: See above Person educated: Patient Education method: Explanation, Demonstration, Tactile cues, Verbal cues, and Handouts Education comprehension: verbalized understanding, returned demonstration, verbal cues required, tactile cues required, and needs further education  HOME EXERCISE PROGRAM: Access Code: [email or web address] URL: [email or web address] Date: 03/14/2022 Prepared by: Vista Mink  Exercises - Standing Scapular Retraction  - 5 x daily - 7 x weekly - 1 sets - 5 reps - 5 second hold - Supine Quadricep Sets  - 5 x daily - 7 x weekly - 2 sets - 10 reps - 5 second hold - Seated Knee Flexion AAROM  - 3 x daily - 7 x weekly - 1 sets - 1 reps - 3 minutes hold  ASSESSMENT:  CLINICAL IMPRESSION: Patient is a 77 y.o. female who was seen today for physical therapy evaluation and treatment for bilateral knee pain (left > right) and sciatica.  Tatem is more interested in her bilateral (left > right) knee rehabilitation as she is hoping to undergo a left TKA if she can get in shape for surgery.  She would also like to lose weight to  decrease her knee pain and improve function.  OBJECTIVE IMPAIRMENTS: Abnormal gait, cardiopulmonary status limiting activity, decreased activity tolerance, decreased endurance, decreased knowledge of condition, difficulty walking, decreased ROM, decreased strength, decreased safety awareness, increased edema, impaired perceived functional ability, improper body mechanics, postural dysfunction, obesity, and pain.   ACTIVITY LIMITATIONS: carrying, lifting, bending, sitting, standing, squatting, stairs, and locomotion level  PARTICIPATION LIMITATIONS: meal prep, cleaning, driving, and community activity  PERSONAL FACTORS: OA, HLD, HTN, pre-diabetes, Bil shoulder pain, spinal stenosis, previous abdominal hysterectomy, previous Rt THA, morbid obesity, chronic Bil knee pain are also affecting patient's functional outcome.   REHAB POTENTIAL: Good  CLINICAL DECISION MAKING: Stable/uncomplicated  EVALUATION COMPLEXITY: Low   GOALS: Goals reviewed with patient? Yes  SHORT TERM GOALS: Target date: 04/11/2022 Improve knee AROM for extension to -12/-8 and flexion to 100/110 Baseline: -18/-12 and 90/103 Goal status: INITIAL  2.  Objectively improve bilateral quadricep strength by at least 10% Baseline: Will be assessed on visit to, was not assessed visit 1 due to patient fatigue Goal status: INITIAL  3.  Altagracia will be independent with her day 1 home exercise program Baseline: Started 03/14/2022 Goal status: INITIAL  LONG TERM GOALS: Target date: 05/09/2022  Improve FOTO to 41 in 14 visits Baseline: 32 Goal status: INITIAL  2.  Improve standing and weightbearing endurance Baseline: Currently limited to standing and walking for short periods of time, including needing to stop and take a break when walking to her apartment from her car Goal status: INITIAL  3.  Improve knee extension AROM to -5 degrees and flexion to 110 degrees bilaterally Baseline: See above Goal status: INITIAL  4.   Improve bilateral quadricep strength as assessed by FOTO and objective measures Baseline: Will be assessed visit 2 objectively Goal status: INITIAL  5.  She will be independent and compliant with her home exercise program at discharge Baseline: Started 03/14/2022 Goal status: INITIAL  PLAN:  PT FREQUENCY: 2x/week  PT DURATION: 8 weeks  PLANNED INTERVENTIONS: Therapeutic exercises, Therapeutic activity, Neuromuscular re-education, Balance training, Gait training, Patient/Family education, Self Care, Joint  mobilization, Stair training, Cryotherapy, Vasopneumatic device, and Manual therapy  PLAN FOR NEXT SESSION: Presurgical conditioning for expected left TKA.  Activities to improve weightbearing endurance, quadriceps strength and knee AROM are prioritized.   Farley Ly, PT, MPT 03/14/2022, 4:04 PM

## 2022-03-20 ENCOUNTER — Ambulatory Visit: Payer: Medicare Other | Admitting: Rehabilitative and Restorative Service Providers"

## 2022-03-20 ENCOUNTER — Encounter: Payer: Self-pay | Admitting: Rehabilitative and Restorative Service Providers"

## 2022-03-20 DIAGNOSIS — M25662 Stiffness of left knee, not elsewhere classified: Secondary | ICD-10-CM

## 2022-03-20 DIAGNOSIS — R600 Localized edema: Secondary | ICD-10-CM

## 2022-03-20 DIAGNOSIS — M6281 Muscle weakness (generalized): Secondary | ICD-10-CM

## 2022-03-20 DIAGNOSIS — M25562 Pain in left knee: Secondary | ICD-10-CM

## 2022-03-20 DIAGNOSIS — R293 Abnormal posture: Secondary | ICD-10-CM | POA: Diagnosis not present

## 2022-03-20 DIAGNOSIS — M25661 Stiffness of right knee, not elsewhere classified: Secondary | ICD-10-CM

## 2022-03-20 DIAGNOSIS — G8929 Other chronic pain: Secondary | ICD-10-CM | POA: Diagnosis not present

## 2022-03-20 DIAGNOSIS — M5431 Sciatica, right side: Secondary | ICD-10-CM

## 2022-03-20 DIAGNOSIS — R262 Difficulty in walking, not elsewhere classified: Secondary | ICD-10-CM | POA: Diagnosis not present

## 2022-03-20 DIAGNOSIS — M25561 Pain in right knee: Secondary | ICD-10-CM | POA: Diagnosis not present

## 2022-03-20 NOTE — Therapy (Signed)
OUTPATIENT PHYSICAL THERAPY TREATMENT NOTE   Patient Name: [name] MRN: [id number] DOB:05/22/1945, 77 y.o., female Today's Date: 03/20/2022  END OF SESSION:   PT End of Session - 03/20/22 0927     Visit Number 2    Number of Visits [employer]    Authorization - Visit Number 2    Progress Note Due on Visit 10    PT Start Time 0850    PT Stop Time 0932    PT Time Calculation (min) 42 min    Activity Tolerance Patient tolerated treatment well;No increased pain;Patient limited by fatigue    Behavior During Therapy [facility] for tasks assessed/performed             Past Medical History:  Diagnosis Date   Anxiety    Arthritis    Back pain    Edema of both lower extremities    Fluid retention    Food allergy    GERD (gastroesophageal reflux disease)    Hyperlipidemia    Hypertension    Knee pain    LBBB (left bundle branch block)    Osteoarthritis    Pre-diabetes    Shoulder pain    Sleep apnea    possibly   Spinal stenosis    Past Surgical History:  Procedure Laterality Date   ABDOMINAL HYSTERECTOMY  1970's   ABDOMINAL HYSTERECTOMY     APPENDECTOMY  1970's   with hysterectomy   APPENDECTOMY     with hysterectomy   COLONOSCOPY     COLONOSCOPY WITH PROPOFOL N/A 10/04/2013   Procedure: COLONOSCOPY WITH PROPOFOL;  Surgeon: Garlan Fair, MD;  Location: [facility];  Service: Endoscopy;  Laterality: N/A;   OOPHORECTOMY     TOTAL HIP ARTHROPLASTY  04/04/2011   Procedure: TOTAL HIP ARTHROPLASTY ANTERIOR APPROACH;  Surgeon: Mcarthur Rossetti, MD;  Location: [facility];  Service: Orthopedics;  Laterality: Right;   TOTAL HIP ARTHROPLASTY     Patient Active Problem List   Diagnosis Date Noted   Prediabetes 12/13/2019   Vitamin D deficiency 12/13/2019   Hyperlipidemia LDL goal <100 12/12/2019   Chronic pain of right knee 11/29/2019   Unilateral primary osteoarthritis, right knee 11/29/2019   Morbid obesity ([place]) 08/17/2019   Chest  pain, rule out acute myocardial infarction 04/20/2017   Chronic pain of left knee 04/15/2017   Unilateral primary osteoarthritis, left knee 04/15/2017   Hypertension    Chest pain with low risk for cardiac etiology 06/17/2014   Degenerative arthritis of hip 04/04/2011     THERAPY DIAG:  Abnormal posture  Difficulty in walking, not elsewhere classified  Muscle weakness (generalized)  Localized edema  Chronic pain of left knee  Chronic pain of right knee  Stiffness of left knee, not elsewhere classified  Stiffness of right knee, not elsewhere classified  Sciatica, right side  PCP: Lajean Manes, MD  REFERRING PROVIDER: Mcarthur Rossetti, MD  REFERRING DIAG: M17.12 (ICD-10-CM) - Unilateral primary osteoarthritis, left knee M17.11 (ICD-10-CM) - Unilateral primary osteoarthritis, right knee M54.41,G89.29 (ICD-10-CM) - Chronic right-sided low back pain with right-sided sciatica  THERAPY DIAG:  No diagnosis found.  Rationale for Evaluation and Treatment: Rehabilitation  ONSET DATE: Chronic knee pain  SUBJECTIVE:   SUBJECTIVE STATEMENT: [name] reports early HEP and is preparing to get her left knee replaced.  Right sciatica is intermittent.  PERTINENT HISTORY: OA, HLD, HTN, pre-diabetes, Bil shoulder pain, spinal stenosis, previous abdominal hysterectomy, previous Rt THA, morbid obesity, chronic Bil  knee pain PAIN:  Are you having pain? Yes: NPRS scale: 3-5/10 bilateral knees, right sciatica 0-6/10 on a 10/10 Pain location: Left > Right knee, intermittent right sciatica Pain description: Ache, burning, sore knees, sciatica is burning Aggravating factors: Prolonged postures Relieving factors: Nothing really helps  PRECAUTIONS: Back  WEIGHT BEARING RESTRICTIONS: No  FALLS:  Has patient fallen in last 6 months? No  LIVING ENVIRONMENT: Lives with: lives alone Lives in: House/apartment Stairs: [name] reports she tries to avoid stairs, limited to 3-4 steps at a  time Has following equipment at home: Single point cane  OCCUPATION: Retired Engineer, manufacturing  PLOF: Needs assistance with ADLs  PATIENT GOALS: Get stronger, lose weight, prepare for surgery.  NEXT MD VISIT: 6 weeks  OBJECTIVE:   DIAGNOSTIC FINDINGS: M17.12 (ICD-10-CM) - Unilateral primary osteoarthritis, left knee M17.11 (ICD-10-CM) - Unilateral primary osteoarthritis, right knee M54.41,G89.29 (ICD-10-CM) - Chronic right-sided low back pain with right-sided sciatica  PATIENT SURVEYS:  FOTO 32 (Goal 41 in 14 visits)  COGNITION: Overall cognitive status: Within functional limits for tasks assessed     SENSATION: Can get intermittent burning to the ankle, mostly to the knee on the right side.  No other tingling, numbness or paresthesias noted.  EDEMA:  Noted but not objectively assessed  POSTURE: rounded shoulders, forward head, and decreased lumbar lordosis   LOWER EXTREMITY ROM:  Active ROM Right 03/14/2022 Left 03/14/2022  Hip flexion    Hip extension    Hip abduction    Hip adduction    Hip internal rotation    Hip external rotation    Knee flexion 103 90  Knee extension -12 -18  Ankle dorsiflexion    Ankle plantarflexion    Ankle inversion/eversion    Lumbar extension 10    (Blank rows = not tested)  LOWER EXTREMITY STRENGTH:  In pounds with hand-held dynamometer Left/Right 03/14/2022   Hip flexion    Hip extension    Hip abduction    Hip adduction    Hip internal rotation    Hip external rotation    *Knee flexion    *Knee extension Deferred due to patient fatigue   Ankle dorsiflexion    Ankle plantarflexion    Ankle inversion/eversion    *Lumbar extension     (Blank rows = not tested)  GAIT: Distance walked: In the clinic with a cane Assistive device utilized: Single point cane Level of assistance: Use cane and is safe with a cane Comments: [name] has a walker but doesn't use it much due to bilateral shoulder pain   TODAY'S TREATMENT:                                                                                                                               DATE:  03/20/2022 Shoulder blade pinches 10X 5 seconds Quadriceps sets (toes back, press knees down and tighten thighs) 2 sets of 10 with 5 seconds hold Tailgate knee flexion AROM 2 minutes  Knee flexion AAROM (left pushes right and right pushes left into flexion) 5X 10 seconds each Supine knee flexion AAROM with belt 5X 10 seconds each Recumbent bike Seat 9 for 5 minutes  Functional Activities Double Leg Press 50# 10X stretch into flexion and extension Single Leg Press 25# 10X stretch into flexion and extension   03/14/2022 Shoulder blade pinches 10X 5 seconds Quadriceps sets (toes back, press knees down and tighten thighs) 10X 5 seconds Tailgate knee flexion AROM 2 minutes   Functional Activities: Reviewed exam findings, basic posture, the importance of avoiding prolonged postures (particularly sitting) and her day 1 HEP  PATIENT EDUCATION:  Education details: See above Person educated: Patient Education method: Explanation, Demonstration, Tactile cues, Verbal cues, and Handouts Education comprehension: verbalized understanding, returned demonstration, verbal cues required, tactile cues required, and needs further education  HOME EXERCISE PROGRAM: Access Code: [email or web address] URL: [email or web address] Date: 03/20/2022 Prepared by: Vista Mink  Exercises - Standing Scapular Retraction  - 5 x daily - 7 x weekly - 1 sets - 5 reps - 5 second hold - Supine Quadricep Sets  - 5 x daily - 7 x weekly - 2 sets - 10 reps - 5 second hold - Seated Knee Flexion AAROM  - 3 x daily - 7 x weekly - 1 sets - 1 reps - 3 minutes hold  ASSESSMENT:  CLINICAL IMPRESSION: [name] reports early home exercise program compliance.  She is very focused on getting in his good shape as possible in preparation for her new knee.  AROM, quadriceps strength and endurance will benefit from  continued work to prepare for upcoming surgery.  OBJECTIVE IMPAIRMENTS: Abnormal gait, cardiopulmonary status limiting activity, decreased activity tolerance, decreased endurance, decreased knowledge of condition, difficulty walking, decreased ROM, decreased strength, decreased safety awareness, increased edema, impaired perceived functional ability, improper body mechanics, postural dysfunction, obesity, and pain.   ACTIVITY LIMITATIONS: carrying, lifting, bending, sitting, standing, squatting, stairs, and locomotion level  PARTICIPATION LIMITATIONS: meal prep, cleaning, driving, and community activity  PERSONAL FACTORS: OA, HLD, HTN, pre-diabetes, Bil shoulder pain, spinal stenosis, previous abdominal hysterectomy, previous Rt THA, morbid obesity, chronic Bil knee pain are also affecting patient's functional outcome.   REHAB POTENTIAL: Good  CLINICAL DECISION MAKING: Stable/uncomplicated  EVALUATION COMPLEXITY: Low   GOALS: Goals reviewed with patient? Yes  SHORT TERM GOALS: Target date: 04/11/2022 Improve knee AROM for extension to -12/-8 and flexion to 100/110 Baseline: -18/-12 and 90/103 Goal status: Ongoing 03/20/2022  2.  Objectively improve bilateral quadricepS strength by at least 10% Baseline: Will be assessed on visit to, was not assessed visit 1 due to patient fatigue Goal status: Ongoing 03/20/2022  3.  [name] will be independent with her day 1 home exercise program Baseline: Started 03/14/2022 Goal status: Ongoing 03/20/2022  LONG TERM GOALS: Target date: 05/09/2022  Improve FOTO to 41 in 14 visits Baseline: 32 Goal status: INITIAL  2.  Improve standing and weightbearing endurance Baseline: Currently limited to standing and walking for short periods of time, including needing to stop and take a break when walking to her apartment from her car Goal status: Ongoing 03/20/2022  3.  Improve knee extension AROM to -5 degrees and flexion to 110 degrees bilaterally Baseline: See  above Goal status: INITIAL  4.  Improve bilateral quadricep strength as assessed by FOTO and objective measures Baseline: Will be assessed visit 2 objectively Goal status: INITIAL  5.  She will be independent and compliant with her home exercise program at discharge Baseline: Started 03/14/2022  Goal status: INITIAL  PLAN:  PT FREQUENCY: 2x/week  PT DURATION: 8 weeks  PLANNED INTERVENTIONS: Therapeutic exercises, Therapeutic activity, Neuromuscular re-education, Balance training, Gait training, Patient/Family education, Self Care, Joint mobilization, Stair training, Cryotherapy, Vasopneumatic device, and Manual therapy  PLAN FOR NEXT SESSION: Presurgical conditioning for expected left TKA.  Activities to improve weightbearing endurance, quadriceps strength and knee AROM are prioritized.  Get an objective quadriceps strength measurement and quickly check AROM.  Farley Ly, PT, MPT 03/20/2022, 2:08 PM

## 2022-03-25 ENCOUNTER — Encounter: Payer: Medicare Other | Admitting: Rehabilitative and Restorative Service Providers"

## 2022-03-26 NOTE — Therapy (Deleted)
OUTPATIENT PHYSICAL THERAPY TREATMENT NOTE   Patient Name: [name] MRN: [id number] DOB:[birth year], 77 y.o., female Today's Date: 03/26/2022  END OF SESSION:     Past Medical History:  Diagnosis Date   Anxiety    Arthritis    Back pain    Edema of both lower extremities    Fluid retention    Food allergy    GERD (gastroesophageal reflux disease)    Hyperlipidemia    Hypertension    Knee pain    LBBB (left bundle branch block)    Osteoarthritis    Pre-diabetes    Shoulder pain    Sleep apnea    possibly   Spinal stenosis    Past Surgical History:  Procedure Laterality Date   ABDOMINAL HYSTERECTOMY  1970's   ABDOMINAL HYSTERECTOMY     APPENDECTOMY  1970's   with hysterectomy   APPENDECTOMY     with hysterectomy   COLONOSCOPY     COLONOSCOPY WITH PROPOFOL N/A 10/04/2013   Procedure: COLONOSCOPY WITH PROPOFOL;  Surgeon: Garlan Fair, MD;  Location: [facility];  Service: Endoscopy;  Laterality: N/A;   OOPHORECTOMY     TOTAL HIP ARTHROPLASTY  04/04/2011   Procedure: TOTAL HIP ARTHROPLASTY ANTERIOR APPROACH;  Surgeon: Mcarthur Rossetti, MD;  Location: [facility];  Service: Orthopedics;  Laterality: Right;   TOTAL HIP ARTHROPLASTY     Patient Active Problem List   Diagnosis Date Noted   Prediabetes 12/13/2019   Vitamin D deficiency 12/13/2019   Hyperlipidemia LDL goal <100 12/12/2019   Chronic pain of right knee 11/29/2019   Unilateral primary osteoarthritis, right knee 11/29/2019   Morbid obesity ([place]) 08/17/2019   Chest pain, rule out acute myocardial infarction 04/20/2017   Chronic pain of left knee 04/15/2017   Unilateral primary osteoarthritis, left knee 04/15/2017   Hypertension    Chest pain with low risk for cardiac etiology 06/17/2014   Degenerative arthritis of hip 04/04/2011     THERAPY DIAG:  No diagnosis found.  PCP: Lajean Manes, MD  REFERRING PROVIDER: Mcarthur Rossetti, MD  REFERRING DIAG: [phone] (ICD-10-CM) - Unilateral  primary osteoarthritis, left knee M17.11 (ICD-10-CM) - Unilateral primary osteoarthritis, right knee M54.41,G89.29 (ICD-10-CM) - Chronic right-sided low back pain with right-sided sciatica  THERAPY DIAG:  No diagnosis found.  Rationale for Evaluation and Treatment: Rehabilitation  ONSET DATE: Chronic knee pain  SUBJECTIVE:   SUBJECTIVE STATEMENT: [name] reports early HEP and is preparing to get her left knee replaced.  Right sciatica is intermittent.  PERTINENT HISTORY: OA, HLD, HTN, pre-diabetes, Bil shoulder pain, spinal stenosis, previous abdominal hysterectomy, previous Rt THA, morbid obesity, chronic Bil knee pain PAIN:  Are you having pain? Yes: NPRS scale: 3-5/10 bilateral knees, right sciatica 0-6/10 on a 10/10 Pain location: Left > Right knee, intermittent right sciatica Pain description: Ache, burning, sore knees, sciatica is burning Aggravating factors: Prolonged postures Relieving factors: Nothing really helps  PRECAUTIONS: Back  WEIGHT BEARING RESTRICTIONS: No  FALLS:  Has patient fallen in last 6 months? No  LIVING ENVIRONMENT: Lives with: lives alone Lives in: House/apartment Stairs: [name] reports she tries to avoid stairs, limited to 3-4 steps at a time Has following equipment at home: Single point cane  OCCUPATION: Retired Engineer, manufacturing  PLOF: Needs assistance with ADLs  PATIENT GOALS: Get stronger, lose weight, prepare for surgery.  NEXT MD VISIT: 6 weeks  OBJECTIVE:   DIAGNOSTIC FINDINGS: M17.12 (ICD-10-CM) - Unilateral primary osteoarthritis, left knee M17.11 (ICD-10-CM) - Unilateral primary osteoarthritis, right  knee M54.41,G89.29 (ICD-10-CM) - Chronic right-sided low back pain with right-sided sciatica  PATIENT SURVEYS:  FOTO 32 (Goal 41 in 14 visits)  COGNITION: Overall cognitive status: Within functional limits for tasks assessed     SENSATION: Can get intermittent burning to the ankle, mostly to the knee on the right side.  No  other tingling, numbness or paresthesias noted.  EDEMA:  Noted but not objectively assessed  POSTURE: rounded shoulders, forward head, and decreased lumbar lordosis   LOWER EXTREMITY ROM:  Active ROM Right 03/14/2022 Left 03/14/2022  Hip flexion    Hip extension    Hip abduction    Hip adduction    Hip internal rotation    Hip external rotation    Knee flexion 103 90  Knee extension -12 -18  Ankle dorsiflexion    Ankle plantarflexion    Ankle inversion/eversion    Lumbar extension 10    (Blank rows = not tested)  LOWER EXTREMITY STRENGTH:  In pounds with hand-held dynamometer Left/Right 03/14/2022   Hip flexion    Hip extension    Hip abduction    Hip adduction    Hip internal rotation    Hip external rotation    *Knee flexion    *Knee extension Deferred due to patient fatigue   Ankle dorsiflexion    Ankle plantarflexion    Ankle inversion/eversion    *Lumbar extension     (Blank rows = not tested)  GAIT: Distance walked: In the clinic with a cane Assistive device utilized: Single point cane Level of assistance: Use cane and is safe with a cane Comments: [name] has a walker but doesn't use it much due to bilateral shoulder pain   TODAY'S TREATMENT:                                                                                                                              DATE:  03/20/2022 Shoulder blade pinches 10X 5 seconds Quadriceps sets (toes back, press knees down and tighten thighs) 2 sets of 10 with 5 seconds hold Tailgate knee flexion AROM 2 minutes Knee flexion AAROM (left pushes right and right pushes left into flexion) 5X 10 seconds each Supine knee flexion AAROM with belt 5X 10 seconds each Recumbent bike Seat 9 for 5 minutes  Functional Activities Double Leg Press 50# 10X stretch into flexion and extension Single Leg Press 25# 10X stretch into flexion and extension   03/14/2022 Shoulder blade pinches 10X 5 seconds Quadriceps sets (toes back,  press knees down and tighten thighs) 10X 5 seconds Tailgate knee flexion AROM 2 minutes   Functional Activities: Reviewed exam findings, basic posture, the importance of avoiding prolonged postures (particularly sitting) and her day 1 HEP  PATIENT EDUCATION:  Education details: See above Person educated: Patient Education method: Explanation, Demonstration, Tactile cues, Verbal cues, and Handouts Education comprehension: verbalized understanding, returned demonstration, verbal cues required, tactile cues required, and needs further education  HOME EXERCISE PROGRAM:  Access Code: [email or web address] URL: [email or web address] Date: 03/20/2022 Prepared by: Vista Mink  Exercises - Standing Scapular Retraction  - 5 x daily - 7 x weekly - 1 sets - 5 reps - 5 second hold - Supine Quadricep Sets  - 5 x daily - 7 x weekly - 2 sets - 10 reps - 5 second hold - Seated Knee Flexion AAROM  - 3 x daily - 7 x weekly - 1 sets - 1 reps - 3 minutes hold  ASSESSMENT:  CLINICAL IMPRESSION: [place] reports early home exercise program compliance.  She is very focused on getting in his good shape as possible in preparation for her new knee.  AROM, quadriceps strength and endurance will benefit from continued work to prepare for upcoming surgery.  OBJECTIVE IMPAIRMENTS: Abnormal gait, cardiopulmonary status limiting activity, decreased activity tolerance, decreased endurance, decreased knowledge of condition, difficulty walking, decreased ROM, decreased strength, decreased safety awareness, increased edema, impaired perceived functional ability, improper body mechanics, postural dysfunction, obesity, and pain.   ACTIVITY LIMITATIONS: carrying, lifting, bending, sitting, standing, squatting, stairs, and locomotion level  PARTICIPATION LIMITATIONS: meal prep, cleaning, driving, and community activity  PERSONAL FACTORS: OA, HLD, HTN, pre-diabetes, Bil shoulder pain, spinal stenosis, previous abdominal  hysterectomy, previous Rt THA, morbid obesity, chronic Bil knee pain are also affecting patient's functional outcome.   REHAB POTENTIAL: Good  CLINICAL DECISION MAKING: Stable/uncomplicated  EVALUATION COMPLEXITY: Low   GOALS: Goals reviewed with patient? Yes  SHORT TERM GOALS: Target date: 04/11/2022 Improve knee AROM for extension to -12/-8 and flexion to 100/110 Baseline: -18/-12 and 90/103 Goal status: Ongoing 03/20/2022  2.  Objectively improve bilateral quadricepS strength by at least 10% Baseline: Will be assessed on visit to, was not assessed visit 1 due to patient fatigue Goal status: Ongoing 03/20/2022  3.  Nobia will be independent with her day 1 home exercise program Baseline: Started 03/14/2022 Goal status: Ongoing 03/20/2022  LONG TERM GOALS: Target date: 05/09/2022  Improve FOTO to 41 in 14 visits Baseline: 32 Goal status: INITIAL  2.  Improve standing and weightbearing endurance Baseline: Currently limited to standing and walking for short periods of time, including needing to stop and take a break when walking to her apartment from her car Goal status: Ongoing 03/20/2022  3.  Improve knee extension AROM to -5 degrees and flexion to 110 degrees bilaterally Baseline: See above Goal status: INITIAL  4.  Improve bilateral quadricep strength as assessed by FOTO and objective measures Baseline: Will be assessed visit 2 objectively Goal status: INITIAL  5.  She will be independent and compliant with her home exercise program at discharge Baseline: Started 03/14/2022 Goal status: INITIAL  PLAN:  PT FREQUENCY: 2x/week  PT DURATION: 8 weeks  PLANNED INTERVENTIONS: Therapeutic exercises, Therapeutic activity, Neuromuscular re-education, Balance training, Gait training, Patient/Family education, Self Care, Joint mobilization, Stair training, Cryotherapy, Vasopneumatic device, and Manual therapy  PLAN FOR NEXT SESSION: Presurgical conditioning for expected left TKA.   Activities to improve weightbearing endurance, quadriceps strength and knee AROM are prioritized.  Get an objective quadriceps strength measurement and quickly check AROM.  Lynden Ang, PT, MPT 03/26/2022, 12:56 PM

## 2022-03-27 ENCOUNTER — Encounter: Payer: Medicare Other | Admitting: Physical Therapy

## 2022-03-31 ENCOUNTER — Other Ambulatory Visit: Payer: Self-pay | Admitting: Internal Medicine

## 2022-04-02 ENCOUNTER — Ambulatory Visit: Payer: Medicare Other | Admitting: Rehabilitative and Restorative Service Providers"

## 2022-04-02 ENCOUNTER — Encounter: Payer: Self-pay | Admitting: Rehabilitative and Restorative Service Providers"

## 2022-04-02 ENCOUNTER — Encounter: Payer: Medicare Other | Admitting: Rehabilitative and Restorative Service Providers"

## 2022-04-02 DIAGNOSIS — R293 Abnormal posture: Secondary | ICD-10-CM

## 2022-04-02 DIAGNOSIS — R600 Localized edema: Secondary | ICD-10-CM

## 2022-04-02 DIAGNOSIS — M25662 Stiffness of left knee, not elsewhere classified: Secondary | ICD-10-CM

## 2022-04-02 DIAGNOSIS — G8929 Other chronic pain: Secondary | ICD-10-CM | POA: Diagnosis not present

## 2022-04-02 DIAGNOSIS — M25562 Pain in left knee: Secondary | ICD-10-CM

## 2022-04-02 DIAGNOSIS — M25661 Stiffness of right knee, not elsewhere classified: Secondary | ICD-10-CM | POA: Diagnosis not present

## 2022-04-02 DIAGNOSIS — M6281 Muscle weakness (generalized): Secondary | ICD-10-CM

## 2022-04-02 DIAGNOSIS — M25561 Pain in right knee: Secondary | ICD-10-CM

## 2022-04-02 DIAGNOSIS — R262 Difficulty in walking, not elsewhere classified: Secondary | ICD-10-CM

## 2022-04-02 DIAGNOSIS — M5431 Sciatica, right side: Secondary | ICD-10-CM | POA: Diagnosis not present

## 2022-04-02 NOTE — Therapy (Signed)
OUTPATIENT PHYSICAL THERAPY TREATMENT NOTE   Patient Name: [name] MRN: [id number] DOB:[birth year], 77 y.o., female Today's Date: 04/02/2022  END OF SESSION:   PT End of Session - 04/02/22 1155     Visit Number 3    Number of Visits 16    Date for PT Re-Evaluation 05/09/22    Authorization Type [employer] Medicare    Progress Note Due on Visit 10    PT Start Time 1148    PT Stop Time 1227    PT Time Calculation (min) 39 min    Activity Tolerance Patient tolerated treatment well    Behavior During Therapy WFL for tasks assessed/performed              Past Medical History:  Diagnosis Date   Anxiety    Arthritis    Back pain    Edema of both lower extremities    Fluid retention    Food allergy    GERD (gastroesophageal reflux disease)    Hyperlipidemia    Hypertension    Knee pain    LBBB (left bundle branch block)    Osteoarthritis    Pre-diabetes    Shoulder pain    Sleep apnea    possibly   Spinal stenosis    Past Surgical History:  Procedure Laterality Date   ABDOMINAL HYSTERECTOMY  1970's   ABDOMINAL HYSTERECTOMY     APPENDECTOMY  1970's   with hysterectomy   APPENDECTOMY     with hysterectomy   COLONOSCOPY     COLONOSCOPY WITH PROPOFOL N/A 10/04/2013   Procedure: COLONOSCOPY WITH PROPOFOL;  Surgeon: Diana Fair, MD;  Location: [facility];  Service: Endoscopy;  Laterality: N/A;   OOPHORECTOMY     TOTAL HIP ARTHROPLASTY  04/04/2011   Procedure: TOTAL HIP ARTHROPLASTY ANTERIOR APPROACH;  Surgeon: Diana Rossetti, MD;  Location: [facility];  Service: Orthopedics;  Laterality: Right;   TOTAL HIP ARTHROPLASTY     Patient Active Problem List   Diagnosis Date Noted   Prediabetes 12/13/2019   Vitamin D deficiency 12/13/2019   Hyperlipidemia LDL goal <100 12/12/2019   Chronic pain of right knee 11/29/2019   Unilateral primary osteoarthritis, right knee 11/29/2019   Morbid obesity ([place]) 08/17/2019   Chest pain, rule out acute myocardial  infarction 04/20/2017   Chronic pain of left knee 04/15/2017   Unilateral primary osteoarthritis, left knee 04/15/2017   Hypertension    Chest pain with low risk for cardiac etiology 06/17/2014   Degenerative arthritis of hip 04/04/2011     THERAPY DIAG:  Abnormal posture  Difficulty in walking, not elsewhere classified  Muscle weakness (generalized)  Localized edema  Chronic pain of left knee  Chronic pain of right knee  Stiffness of left knee, not elsewhere classified  Stiffness of right knee, not elsewhere classified  Sciatica, right side  PCP: Diana Manes, MD  REFERRING PROVIDER: Mcarthur Rossetti, MD  REFERRING DIAG: M17.12 (ICD-10-CM) - Unilateral primary osteoarthritis, left knee M17.11 (ICD-10-CM) - Unilateral primary osteoarthritis, right knee M54.41,G89.29 (ICD-10-CM) - Chronic right-sided low back pain with right-sided sciatica  THERAPY DIAG:  No diagnosis found.  Rationale for Evaluation and Treatment: Rehabilitation  ONSET DATE: Chronic knee pain  SUBJECTIVE:   SUBJECTIVE STATEMENT: She indicated having illness that has hindered her in last week.  Illness impacted activity in HEP.   PERTINENT HISTORY: OA, HLD, HTN, pre-diabetes, Bil shoulder pain, spinal stenosis, previous abdominal hysterectomy, previous Rt THA, morbid obesity, chronic Bil knee pain PAIN:  NPRS scale: Rated knee pain 6/10 at most today Pain location: Left > Right knee, intermittent right sciatica Pain description: Ache, burning, sore knees, sciatica is burning Aggravating factors: Prolonged postures Relieving factors: Nothing really helps  PRECAUTIONS: Back  WEIGHT BEARING RESTRICTIONS: No  FALLS:  Has patient fallen in last 6 months? No  LIVING ENVIRONMENT: Lives with: lives alone Lives in: House/apartment Stairs: Yamani reports she tries to avoid stairs, limited to 3-4 steps at a time Has following equipment at home: Single point cane  OCCUPATION: Retired  Engineer, manufacturing  PLOF: Needs assistance with ADLs  PATIENT GOALS: Get stronger, lose weight, prepare for surgery.  NEXT MD VISIT: 6 weeks  OBJECTIVE:   DIAGNOSTIC FINDINGS: M17.12 (ICD-10-CM) - Unilateral primary osteoarthritis, left knee M17.11 (ICD-10-CM) - Unilateral primary osteoarthritis, right knee M54.41,G89.29 (ICD-10-CM) - Chronic right-sided low back pain with right-sided sciatica  PATIENT SURVEYS:  03/14/2022 FOTO 32 (Goal 41 in 14 visits)  COGNITION: 03/14/2022 Overall cognitive status: Within functional limits for tasks assessed     SENSATION: 03/14/2022 Can get intermittent burning to the ankle, mostly to the knee on the right side.  No other tingling, numbness or paresthesias noted.  EDEMA:  03/14/2022 Noted but not objectively assessed  POSTURE: 03/14/2022  rounded shoulders, forward head, and decreased lumbar lordosis   LOWER EXTREMITY ROM:  Active ROM Right 03/14/2022 Left 03/14/2022  Hip flexion    Hip extension    Hip abduction    Hip adduction    Hip internal rotation    Hip external rotation    Knee flexion 103 90  Knee extension -12 -18  Ankle dorsiflexion    Ankle plantarflexion    Ankle inversion/eversion    Lumbar extension 10    (Blank rows = not tested)  LOWER EXTREMITY STRENGTH:   Left/Right 03/14/2022 Right 04/02/2022 Left 04/02/2022  Hip flexion     Hip extension     Hip abduction     Hip adduction     Hip internal rotation     Hip external rotation     *Knee flexion     *Knee extension Deferred due to patient fatigue 5/5 40.8, 40.4 lbs 5/5 44.6 , 51 lbs  Ankle dorsiflexion     Ankle plantarflexion     Ankle inversion/eversion     *Lumbar extension      (Blank rows = not tested)  GAIT: 03/14/2022 Distance walked: In the clinic with a cane Assistive device utilized: Single point cane Level of assistance: Use cane and is safe with a cane Comments: [name] has a walker but doesn't use it much due to bilateral shoulder  pain   TODAY'S TREATMENT:                                                             DATE: 04/02/2022 Nustep Lvl 6 8.5 mins UE/LE Leg press double leg 68 lbs x 20, single leg 21 lbs x 15 bilateral Seated quad set 5 sec hold x 10 bilateral (cues for home) Seated quad set c SLR 2 x 10 bilateral (cues for home)    TODAY'S TREATMENT:  DATE: 03/20/2022 Shoulder blade pinches 10X 5 seconds Quadriceps sets (toes back, press knees down and tighten thighs) 2 sets of 10 with 5 seconds hold Tailgate knee flexion AROM 2 minutes Knee flexion AAROM (left pushes right and right pushes left into flexion) 5X 10 seconds each Supine knee flexion AAROM with belt 5X 10 seconds each Recumbent bike Seat 9 for 5 minutes  Functional Activities Double Leg Press 50# 10X stretch into flexion and extension Single Leg Press 25# 10X stretch into flexion and extension   TODAY'S TREATMENT:                                                             DATE:  03/14/2022 Shoulder blade pinches 10X 5 seconds Quadriceps sets (toes back, press knees down and tighten thighs) 10X 5 seconds Tailgate knee flexion AROM 2 minutes   Functional Activities: Reviewed exam findings, basic posture, the importance of avoiding prolonged postures (particularly sitting) and her day 1 HEP  PATIENT EDUCATION:  04/02/2022:   Education details: See above Person educated: Patient Education method: Explanation, Demonstration, Tactile cues, Verbal cues, and Handouts Education comprehension: verbalized understanding, returned demonstration, verbal cues required, tactile cues required, and needs further education  HOME EXERCISE PROGRAM: Access Code: [email or web address] URL: [email or web address] Date: 03/20/2022 Prepared by: Vista Mink  Exercises - Standing Scapular Retraction  - 5 x daily - 7 x weekly - 1 sets - 5 reps - 5 second hold - Supine Quadricep Sets  - 5 x daily - 7  x weekly - 2 sets - 10 reps - 5 second hold - Seated Knee Flexion AAROM  - 3 x daily - 7 x weekly - 1 sets - 1 reps - 3 minutes hold  ASSESSMENT:  CLINICAL IMPRESSION: Adjusted cane height to place appropriately for her height.  Additional of several seated strengthening activities to HEP to allow middle of the day activity while sitting.   OBJECTIVE IMPAIRMENTS: Abnormal gait, cardiopulmonary status limiting activity, decreased activity tolerance, decreased endurance, decreased knowledge of condition, difficulty walking, decreased ROM, decreased strength, decreased safety awareness, increased edema, impaired perceived functional ability, improper body mechanics, postural dysfunction, obesity, and pain.   ACTIVITY LIMITATIONS: carrying, lifting, bending, sitting, standing, squatting, stairs, and locomotion level  PARTICIPATION LIMITATIONS: meal prep, cleaning, driving, and community activity  PERSONAL FACTORS: OA, HLD, HTN, pre-diabetes, Bil shoulder pain, spinal stenosis, previous abdominal hysterectomy, previous Rt THA, morbid obesity, chronic Bil knee pain are also affecting patient's functional outcome.   REHAB POTENTIAL: Good  CLINICAL DECISION MAKING: Stable/uncomplicated  EVALUATION COMPLEXITY: Low   GOALS: Goals reviewed with patient? Yes  SHORT TERM GOALS: Target date: 04/11/2022 Improve knee AROM for extension to -12/-8 and flexion to 100/110 Baseline: -18/-12 and 90/103 Goal status: on going 04/02/2022  2.  Objectively improve bilateral quadricepS strength by at least 10% Baseline: Will be assessed on visit to, was not assessed visit 1 due to patient fatigue Goal status:on going 04/02/2022  3.  Lauraann will be independent with her day 1 home exercise program Baseline: Started 03/14/2022 Goal status: Met  LONG TERM GOALS: Target date: 05/09/2022  Improve FOTO to 41 in 14 visits  Goal status: on going 04/02/2022  2.  Improve standing and weightbearing endurance    Baseline: Currently limited to standing and walking for short  periods of time, including needing to stop and take a break when walking to her apartment from her car Goal status: Ongoing 03/20/2022  3.  Improve knee extension AROM to -5 degrees and flexion to 110 degrees bilaterally  Goal status: on going 04/02/2022  4.  Improve bilateral quadricep strength as assessed by FOTO and objective measures  Goal status: on going 04/02/2022  5.  She will be independent and compliant with her home exercise program at discharge  Goal status: on going 04/02/2022  PLAN:  PT FREQUENCY: 2x/week  PT DURATION: 8 weeks  PLANNED INTERVENTIONS: Therapeutic exercises, Therapeutic activity, Neuromuscular re-education, Balance training, Gait training, Patient/Family education, Self Care, Joint mobilization, Stair training, Cryotherapy, Vasopneumatic device, and Manual therapy  PLAN FOR NEXT SESSION: Progressive strengthening avoiding WB pressure pain increases.    Scot Jun, PT, DPT, OCS, ATC 04/02/22  12:28 PM

## 2022-04-04 ENCOUNTER — Encounter: Payer: Medicare Other | Admitting: Rehabilitative and Restorative Service Providers"

## 2022-04-09 ENCOUNTER — Encounter: Payer: Medicare Other | Admitting: Rehabilitative and Restorative Service Providers"

## 2022-04-11 ENCOUNTER — Ambulatory Visit: Payer: Medicare Other | Admitting: Rehabilitative and Restorative Service Providers"

## 2022-04-11 ENCOUNTER — Encounter: Payer: Self-pay | Admitting: Rehabilitative and Restorative Service Providers"

## 2022-04-11 DIAGNOSIS — R262 Difficulty in walking, not elsewhere classified: Secondary | ICD-10-CM | POA: Diagnosis not present

## 2022-04-11 DIAGNOSIS — R293 Abnormal posture: Secondary | ICD-10-CM | POA: Diagnosis not present

## 2022-04-11 DIAGNOSIS — G8929 Other chronic pain: Secondary | ICD-10-CM | POA: Diagnosis not present

## 2022-04-11 DIAGNOSIS — R600 Localized edema: Secondary | ICD-10-CM | POA: Diagnosis not present

## 2022-04-11 DIAGNOSIS — M5431 Sciatica, right side: Secondary | ICD-10-CM

## 2022-04-11 DIAGNOSIS — M25662 Stiffness of left knee, not elsewhere classified: Secondary | ICD-10-CM | POA: Diagnosis not present

## 2022-04-11 DIAGNOSIS — M6281 Muscle weakness (generalized): Secondary | ICD-10-CM | POA: Diagnosis not present

## 2022-04-11 DIAGNOSIS — M25561 Pain in right knee: Secondary | ICD-10-CM | POA: Diagnosis not present

## 2022-04-11 DIAGNOSIS — M25661 Stiffness of right knee, not elsewhere classified: Secondary | ICD-10-CM | POA: Diagnosis not present

## 2022-04-11 DIAGNOSIS — M25562 Pain in left knee: Secondary | ICD-10-CM | POA: Diagnosis not present

## 2022-04-11 NOTE — Therapy (Addendum)
OUTPATIENT PHYSICAL THERAPY TREATMENT/PROGRESS NOTE  /DISCHARGE   Patient Name: PIEPER KASIK MRN: [id number] DOB:05/07/1945, 77 y.o., female Today's Date: 04/11/2022  END OF SESSION:   PT End of Session - 04/11/22 1329     Visit Number 4    Number of Visits 16    Date for PT Re-Evaluation 05/09/22    Authorization Type [employer] Medicare    Authorization - Visit Number 4    Progress Note Due on Visit 14    PT Start Time 1019    PT Stop Time 1112    PT Time Calculation (min) 53 min    Activity Tolerance Patient tolerated treatment well;No increased pain;Patient limited by pain    Behavior During Therapy [facility] for tasks assessed/performed           Progress Note Reporting Period 03/14/2022 to 04/11/2022  See note below for Objective Data and Assessment of Progress/Goals.   Past Medical History:  Diagnosis Date   Anxiety    Arthritis    Back pain    Edema of both lower extremities    Fluid retention    Food allergy    GERD (gastroesophageal reflux disease)    Hyperlipidemia    Hypertension    Knee pain    LBBB (left bundle branch block)    Osteoarthritis    Pre-diabetes    Shoulder pain    Sleep apnea    possibly   Spinal stenosis    Past Surgical History:  Procedure Laterality Date   ABDOMINAL HYSTERECTOMY  1970's   ABDOMINAL HYSTERECTOMY     APPENDECTOMY  1970's   with hysterectomy   APPENDECTOMY     with hysterectomy   COLONOSCOPY     COLONOSCOPY WITH PROPOFOL N/A 10/04/2013   Procedure: COLONOSCOPY WITH PROPOFOL;  Surgeon: Charolett Bumpers, MD;  Location: [facility];  Service: Endoscopy;  Laterality: N/A;   OOPHORECTOMY     TOTAL HIP ARTHROPLASTY  04/04/2011   Procedure: TOTAL HIP ARTHROPLASTY ANTERIOR APPROACH;  Surgeon: [name], MD;  Location: [facility];  Service: Orthopedics;  Laterality: Right;   TOTAL HIP ARTHROPLASTY     Patient Active Problem List   Diagnosis Date Noted   Prediabetes 12/13/2019   Vitamin D deficiency 12/13/2019    Hyperlipidemia LDL goal <100 12/12/2019   Chronic pain of right knee 11/29/2019   Unilateral primary osteoarthritis, right knee 11/29/2019   Morbid obesity ([facility]) 08/17/2019   Chest pain, rule out acute myocardial infarction 04/20/2017   Chronic pain of left knee 04/15/2017   Unilateral primary osteoarthritis, left knee 04/15/2017   Hypertension    Chest pain with low risk for cardiac etiology 06/17/2014   Degenerative arthritis of hip 04/04/2011     THERAPY DIAG:  Abnormal posture  Difficulty in walking, not elsewhere classified  Muscle weakness (generalized)  Localized edema  Chronic pain of left knee  Chronic pain of right knee  Stiffness of left knee, not elsewhere classified  Stiffness of right knee, not elsewhere classified  Sciatica, right side  PCP: Merlene Laughter, MD  REFERRING PROVIDER: [name], MD  REFERRING DIAG: M17.12 (ICD-10-CM) - Unilateral primary osteoarthritis, left knee M17.11 (ICD-10-CM) - Unilateral primary osteoarthritis, right knee M54.41,G89.29 (ICD-10-CM) - Chronic right-sided low back pain with right-sided sciatica  THERAPY DIAG:  No diagnosis found.  Rationale for Evaluation and Treatment: Rehabilitation  ONSET DATE: Chronic knee pain  SUBJECTIVE:   SUBJECTIVE STATEMENT: Torrie gives herself a "C" grade with her home exercise program compliance.  She realizes she needs to increase her consistency in order to prepare for her upcoming surgery.  PERTINENT HISTORY: OA, HLD, HTN, pre-diabetes, Bil shoulder pain, spinal stenosis, previous abdominal hysterectomy, previous Rt THA, morbid obesity, chronic Bil knee pain PAIN:  NPRS scale: 3-6/10 this week Pain location: Left > Right knee, intermittent right sciatica Pain description: Ache, burning, sore knees, sciatica is burning Aggravating factors: Prolonged postures Relieving factors: Nothing really helps  PRECAUTIONS: Back  WEIGHT BEARING RESTRICTIONS: No  FALLS:  Has  patient fallen in last 6 months? No  LIVING ENVIRONMENT: Lives with: lives alone Lives in: House/apartment Stairs: Roma reports she tries to avoid stairs, limited to 3-4 steps at a time Has following equipment at home: Single point cane  OCCUPATION: Retired Teacher, early years/pre  PLOF: Needs assistance with ADLs  PATIENT GOALS: Get stronger, lose weight, prepare for surgery.  NEXT MD VISIT: 6 weeks  OBJECTIVE:   DIAGNOSTIC FINDINGS: M17.12 (ICD-10-CM) - Unilateral primary osteoarthritis, left knee M17.11 (ICD-10-CM) - Unilateral primary osteoarthritis, right knee M54.41,G89.29 (ICD-10-CM) - Chronic right-sided low back pain with right-sided sciatica  PATIENT SURVEYS:  04/11/2022 FOTO 48 (Goal met)  03/14/2022 FOTO 32 (Goal 41 in 14 visits)  COGNITION: 03/14/2022 Overall cognitive status: Within functional limits for tasks assessed     SENSATION: 03/14/2022 Can get intermittent burning to the ankle, mostly to the knee on the right side.  No other tingling, numbness or paresthesias noted.  EDEMA:  03/14/2022 Noted but not objectively assessed  POSTURE: 03/14/2022  rounded shoulders, forward head, and decreased lumbar lordosis   LOWER EXTREMITY ROM:  Active ROM Right 03/14/2022 Left 03/14/2022 Left/Right 04/11/2022  Hip flexion     Hip extension     Hip abduction     Hip adduction     Hip internal rotation     Hip external rotation     Knee flexion 103 90 97/107  Knee extension -12 -18 -10/-5  Ankle dorsiflexion     Ankle plantarflexion     Ankle inversion/eversion     Lumbar extension 10     (Blank rows = not tested)  LOWER EXTREMITY STRENGTH:   Left/Right 03/14/2022 Right 04/02/2022 Left 04/02/2022  Hip flexion     Hip extension     Hip abduction     Hip adduction     Hip internal rotation     Hip external rotation     *Knee flexion     *Knee extension Deferred due to patient fatigue 5/5 40.8, 40.4 lbs 5/5 44.6 , 51 lbs  Ankle dorsiflexion     Ankle  plantarflexion     Ankle inversion/eversion     *Lumbar extension      (Blank rows = not tested)  GAIT: 03/14/2022 Distance walked: In the clinic with a cane Assistive device utilized: Single point cane Level of assistance: Use cane and is safe with a cane Comments: Brodi has a walker but doesn't use it much due to bilateral shoulder pain   TODAY'S TREATMENT:                                                             DATE:  04/11/2022 Shoulder blade pinches 10X 5 seconds Quadriceps sets (toes back, press knees down and tighten thighs) 2 sets of 10 with 5 seconds  hold Tailgate knee flexion AROM 2 minutes Knee flexion AAROM (left pushes right and right pushes left into flexion) 5X 10 seconds each Supine knee flexion AAROM with belt 5X 10 seconds each  Functional Activities Double Leg Press 62# 15X stretch into flexion and extension Single Leg Press 31# each side 10X stretch into flexion and extension  Vaso Bil knees 10 minutes, Medium Pressure 34*   04/02/2022 Nustep Lvl 6 8.5 mins UE/LE Leg press double leg 68 lbs x 20, single leg 21 lbs x 15 bilateral Seated quad set 5 sec hold x 10 bilateral (cues for home) Seated quad set c SLR 2 x 10 bilateral (cues for home)   03/20/2022 Shoulder blade pinches 10X 5 seconds Quadriceps sets (toes back, press knees down and tighten thighs) 2 sets of 10 with 5 seconds hold Tailgate knee flexion AROM 2 minutes Knee flexion AAROM (left pushes right and right pushes left into flexion) 5X 10 seconds each Supine knee flexion AAROM with belt 5X 10 seconds each Recumbent bike Seat 9 for 5 minutes  Functional Activities Double Leg Press 50# 10X stretch into flexion and extension Single Leg Press 25# 10X stretch into flexion and extension   PATIENT EDUCATION:  04/02/2022:   Education details: See above Person educated: Patient Education method: Explanation, Demonstration, Tactile cues, Verbal cues, and Handouts Education comprehension:  verbalized understanding, returned demonstration, verbal cues required, tactile cues required, and needs further education  HOME EXERCISE PROGRAM: Access Code: [email or web address] URL: [email or web address] Date: 03/20/2022 Prepared by: Pauletta Browns  Exercises - Standing Scapular Retraction  - 5 x daily - 7 x weekly - 1 sets - 5 reps - 5 second hold - Supine Quadricep Sets  - 5 x daily - 7 x weekly - 2 sets - 10 reps - 5 second hold - Seated Knee Flexion AAROM  - 3 x daily - 7 x weekly - 1 sets - 1 reps - 3 minutes hold  ASSESSMENT:  CLINICAL IMPRESSION: Rian notes progress, even with limited home exercise program compliance.  She has an appointment with Dr. Magnus Ivan next week and she will discuss going ahead with her total knee replacement versus continued supervised physical therapy and weight loss strategies.  Based on her decision, along with Dr. Eliberto Ivory recommendations, we will either discharge her from supervised physical therapy and follow-up post-surgery or continue with supervised physical therapy for a bit longer to prepare her for her upcoming total knee replacement.  Leahann has only attended 4 rehabilitation visits thus far.  OBJECTIVE IMPAIRMENTS: Abnormal gait, cardiopulmonary status limiting activity, decreased activity tolerance, decreased endurance, decreased knowledge of condition, difficulty walking, decreased ROM, decreased strength, decreased safety awareness, increased edema, impaired perceived functional ability, improper body mechanics, postural dysfunction, obesity, and pain.   ACTIVITY LIMITATIONS: carrying, lifting, bending, sitting, standing, squatting, stairs, and locomotion level  PARTICIPATION LIMITATIONS: meal prep, cleaning, driving, and community activity  PERSONAL FACTORS: OA, HLD, HTN, pre-diabetes, Bil shoulder pain, spinal stenosis, previous abdominal hysterectomy, previous Rt THA, morbid obesity, chronic Bil knee pain are also affecting  patient's functional outcome.   REHAB POTENTIAL: Good  CLINICAL DECISION MAKING: Stable/uncomplicated  EVALUATION COMPLEXITY: Low   GOALS: Goals reviewed with patient? Yes  SHORT TERM GOALS: Target date: 04/11/2022 Improve knee AROM for extension to -12/-8 and flexion to 100/110 Baseline: -18/-12 and 90/103 Goal status: Partially Met 04/11/2022  2.  Objectively improve bilateral quadricepS strength by at least 10% Baseline: Will be assessed on visit to, was not assessed visit 1 due to  patient fatigue Goal status: On going 04/11/2022  3.  Deirdre will be independent with her day 1 home exercise program Baseline: Started 03/14/2022 Goal status: Met 04/11/2022  LONG TERM GOALS: Target date: 05/09/2022  Improve FOTO to 41 in 14 visits  Goal status: Met 04/11/2022  2.  Improve standing and weightbearing endurance   Baseline: Currently limited to standing and walking for short periods of time, including needing to stop and take a break when walking to her apartment from her car Goal status: On Going 04/11/2022  3.  Improve knee extension AROM to -5 degrees and flexion to 110 degrees bilaterally  Goal status: On Going 04/11/2022  4.  Improve bilateral quadricep strength as assessed by FOTO and objective measures  Goal status: On Going 04/11/2022  5.  She will be independent and compliant with her home exercise program at discharge  Goal status: On Going 04/11/2022  PLAN:  PT FREQUENCY: 2x/week  PT DURATION: 8 weeks  PLANNED INTERVENTIONS: Therapeutic exercises, Therapeutic activity, Neuromuscular re-education, Balance training, Gait training, Patient/Family education, Self Care, Joint mobilization, Stair training, Cryotherapy, Vasopneumatic device, and Manual therapy  PLAN FOR NEXT SESSION: Continue quadriceps strengthening emphasis along with AROM to maximize postsurgical AROM  Cherlyn Cushing PT, MPT 04/11/22  1:37 PM   PHYSICAL THERAPY DISCHARGE SUMMARY  Visits from Start of Care:  4  Current functional level related to goals / functional outcomes: See note   Remaining deficits: See note   Education / Equipment: HEP  Patient goals were partially met. Patient is being discharged due to not returning since the last visit.  Chyrel Masson, PT, DPT, OCS, ATC 06/11/22  3:42 PM

## 2022-04-14 ENCOUNTER — Encounter: Payer: Self-pay | Admitting: Orthopaedic Surgery

## 2022-04-14 ENCOUNTER — Ambulatory Visit: Payer: Medicare Other | Admitting: Orthopaedic Surgery

## 2022-04-14 VITALS — Ht 65.0 in | Wt 257.0 lb

## 2022-04-14 DIAGNOSIS — M1712 Unilateral primary osteoarthritis, left knee: Secondary | ICD-10-CM

## 2022-04-14 DIAGNOSIS — M25562 Pain in left knee: Secondary | ICD-10-CM | POA: Diagnosis not present

## 2022-04-14 DIAGNOSIS — M25561 Pain in right knee: Secondary | ICD-10-CM

## 2022-04-14 DIAGNOSIS — M1711 Unilateral primary osteoarthritis, right knee: Secondary | ICD-10-CM

## 2022-04-14 DIAGNOSIS — G8929 Other chronic pain: Secondary | ICD-10-CM | POA: Diagnosis not present

## 2022-04-14 NOTE — Progress Notes (Signed)
The patient is well-known to me.  She is 77 years old and asked appears significantly younger.  She has severe debilitating arthritis in both her knees which are bone-on-bone wear and significantly debilitating for her.  Her pain is daily and severe.  She does walk with a cane.  She has worked on weight loss but her BMI today is still at 42.77 and that was the same when we weighed her several months ago.  She understands that we cannot proceed with any type of surgery until her BMI is below 40 based on her insurance.  On exam of both knees I am comfortable with proceeding with surgery however this is more of an insurance issue.  She has significant varus malalignment of both knees which is correctable but not a very large soft tissue envelope around either knee.  Of note she is not a diabetic.  She will work on weight loss and I like to see her back in 6 weeks for repeat weight and BMI calculation.  At this point I would not recommend any type of injections anymore.

## 2022-04-16 ENCOUNTER — Encounter: Payer: Medicare Other | Admitting: Rehabilitative and Restorative Service Providers"

## 2022-04-17 ENCOUNTER — Encounter: Payer: Self-pay | Admitting: Radiology

## 2022-04-18 ENCOUNTER — Encounter: Payer: Medicare Other | Admitting: Rehabilitative and Restorative Service Providers"

## 2022-05-08 DIAGNOSIS — J0110 Acute frontal sinusitis, unspecified: Secondary | ICD-10-CM | POA: Diagnosis not present

## 2022-05-08 DIAGNOSIS — R7309 Other abnormal glucose: Secondary | ICD-10-CM | POA: Diagnosis not present

## 2022-05-26 ENCOUNTER — Ambulatory Visit: Payer: Medicare Other | Admitting: Orthopaedic Surgery

## 2022-05-30 DIAGNOSIS — Z9989 Dependence on other enabling machines and devices: Secondary | ICD-10-CM | POA: Diagnosis not present

## 2022-05-30 DIAGNOSIS — I471 Supraventricular tachycardia, unspecified: Secondary | ICD-10-CM | POA: Diagnosis not present

## 2022-07-22 DIAGNOSIS — H2513 Age-related nuclear cataract, bilateral: Secondary | ICD-10-CM | POA: Diagnosis not present

## 2022-08-08 ENCOUNTER — Ambulatory Visit
Admission: RE | Admit: 2022-08-08 | Discharge: 2022-08-08 | Disposition: A | Discharge status: Home / Self Care | Payer: Medicare Other | Source: Ambulatory Visit | Attending: Internal Medicine | Admitting: Internal Medicine

## 2022-08-08 ENCOUNTER — Other Ambulatory Visit: Payer: Self-pay | Admitting: Internal Medicine

## 2022-08-08 DIAGNOSIS — M8588 Other specified disorders of bone density and structure, other site: Secondary | ICD-10-CM | POA: Diagnosis not present

## 2022-08-08 DIAGNOSIS — Z1231 Encounter for screening mammogram for malignant neoplasm of breast: Secondary | ICD-10-CM | POA: Diagnosis not present

## 2022-08-08 DIAGNOSIS — E349 Endocrine disorder, unspecified: Secondary | ICD-10-CM | POA: Diagnosis not present

## 2022-08-08 DIAGNOSIS — M8580 Other specified disorders of bone density and structure, unspecified site: Secondary | ICD-10-CM

## 2022-08-08 DIAGNOSIS — R29890 Loss of height: Secondary | ICD-10-CM | POA: Diagnosis not present

## 2022-08-25 DIAGNOSIS — I5189 Other ill-defined heart diseases: Secondary | ICD-10-CM | POA: Diagnosis not present

## 2022-08-25 DIAGNOSIS — M7989 Other specified soft tissue disorders: Secondary | ICD-10-CM | POA: Diagnosis not present

## 2022-08-25 DIAGNOSIS — I10 Essential (primary) hypertension: Secondary | ICD-10-CM | POA: Diagnosis not present

## 2022-08-25 DIAGNOSIS — I471 Supraventricular tachycardia, unspecified: Secondary | ICD-10-CM | POA: Diagnosis not present

## 2022-08-25 DIAGNOSIS — N1831 Chronic kidney disease, stage 3a: Secondary | ICD-10-CM | POA: Diagnosis not present

## 2022-09-08 ENCOUNTER — Ambulatory Visit (HOSPITAL_BASED_OUTPATIENT_CLINIC_OR_DEPARTMENT_OTHER): Payer: Medicare Other | Admitting: Family

## 2022-09-08 NOTE — Progress Notes (Deleted)
Cardiology Office Note:  .   Date:  09/08/2022  ID:  [name], DOB 05-01-1945, MRN [id number] PCP: Merlene Laughter, MD (Inactive)  [facility] Providers Cardiologist:  None { Click to update primary MD,subspecialty MD or APP then REFRESH:1}   History of Present Illness: .   [name] is a 77 y.o. female with hx of LBBB, HLD, nonobstructive CAD, HTN  Prior workup for chest pain and LBBB with Dr. Donnie Aho in 2016 stress test negative for ischemia.  Evaluated 2019 while hospitalized with chest pain with echo normal LVEF and coronary CTA calcium score of 6 placing her in the 45th percentile for age/sex matched control.  Overall minimal nonobstructive coronary disease in the proximal LAD.  Reestablished with Dr. Rennis Golden 10/10/2020 with episode of burning chest discomfort with negative troponin X2 in ED. Rosuvastatin and Aspirin added for secondary prevention. Her chest pain was felt to be atypical for angina.   She presents today for follow-up ***  ROS: Please see the history of present illness.    All other systems reviewed and are negative.   Studies Reviewed: .        Cardiac Studies & Procedures     STRESS TESTS  NM MYOCAR MULTI W/SPECT W 06/18/2014  Narrative CLINICAL DATA:  Chest pain, hypertension, shortness of breath, history of rapid heart rate per patient  EXAM: MYOCARDIAL IMAGING WITH SPECT (REST AND PHARMACOLOGIC-STRESS)  GATED LEFT VENTRICULAR WALL MOTION STUDY  LEFT VENTRICULAR EJECTION FRACTION  TECHNIQUE: Standard myocardial SPECT imaging was performed after resting intravenous injection of 10 mCi Tc-42m sestamibi. Subsequently, intravenous infusion of Lexiscan was performed under the supervision of the Cardiology staff. At peak effect of the drug, 30 mCi Tc-89m sestamibi was injected intravenously and standard myocardial SPECT imaging was performed. Quantitative gated imaging was also performed to evaluate left ventricular wall motion, and  estimate left ventricular ejection fraction.  COMPARISON:  None  FINDINGS: Perfusion: Small focus of non reversible decreased myocardial perfusion at the anteroseptal portion of LEFT ventricular apex, potentially related to breast attenuation. No additional myocardial perfusion defects.  Wall Motion: Normal left ventricular wall motion. No left ventricular dilation.  Left Ventricular Ejection Fraction: 65 %  End diastolic volume 68 ml  End systolic volume 24 ml  IMPRESSION: 1. Small non reversible area of decreased myocardial perfusion at the anteroseptal portion of LEFT ventricular apex, potentially representing a breast attenuation artifact. No other perfusion defects identified.  2. Normal left ventricular wall motion.  3. Left ventricular ejection fraction 65%  4. Low-risk stress test findings*.  *2012 Appropriate Use Criteria for Coronary Revascularization Focused Update: J Am Coll Cardiol. 2012;59(9):857-881. http://content.dementiazones.com.aspx?articleid=1201161   Electronically Signed By: Ulyses Southward M.D. On: 06/18/2014 12:36   ECHOCARDIOGRAM  ECHOCARDIOGRAM COMPLETE 04/21/2017  Narrative *[place]* *[facility]* [address] [place], [place] [address] [phone]  ------------------------------------------------------------------- Transthoracic Echocardiography  Patient:    Diana, French MR #:       [id number] Study Date: 04/21/2017 Gender:     F Age:        71 Height:     167.6 cm Weight:     116.9 kg BSA:        2.39 m^2 Pt. Status: Room:       3E17C  ATTENDING    Hollice Espy ADMITTING    Lyda Perone M SONOGRAPHER  Roosvelt Maser, RDCS PERFORMING   [facility], Inpatient ORDERING     Marcelino Duster REFERRING  Marcelino Duster  cc:  ------------------------------------------------------------------- LV EF: 55% -    60%  ------------------------------------------------------------------- Indications:      Chest pain 786.51.  ------------------------------------------------------------------- History:   PMH:  LBBB. Negative troponins.  Risk factors: Hypertension.  ------------------------------------------------------------------- Study Conclusions  - Left ventricle: The cavity size was normal. Wall thickness was increased in a pattern of mild LVH. Systolic function was normal. The estimated ejection fraction was in the range of 55% to 60%. Wall motion was normal; there were no regional wall motion abnormalities. Doppler parameters are consistent with abnormal left ventricular relaxation (grade 1 diastolic dysfunction). - Mitral valve: There was trivial regurgitation. - Tricuspid valve: There was mild regurgitation.  ------------------------------------------------------------------- Study data:  No prior study was available for comparison.  Study status:  Routine.  Procedure:  The patient reported no pain pre or post test. Transthoracic echocardiography. Image quality was adequate.  Study completion:  There were no complications. Transthoracic echocardiography.  M-mode, complete 2D, spectral Doppler, and color Doppler.  Birthdate:  Patient birthdate: 1945/12/28.  Age:  Patient is 77 yr old.  Sex:  Gender: female. BMI: 41.6 kg/m^2.  Blood pressure:     137/67  Patient status: Inpatient.  Study date:  Study date: 04/21/2017. Study time: 08:42 AM.  -------------------------------------------------------------------  ------------------------------------------------------------------- Left ventricle:  The cavity size was normal. Wall thickness was increased in a pattern of mild LVH. Systolic function was normal. The estimated ejection fraction was in the range of 55% to 60%. Wall motion was normal; there were no regional wall motion abnormalities. Doppler parameters are consistent with  abnormal left ventricular relaxation (grade 1 diastolic dysfunction).  ------------------------------------------------------------------- Aortic valve:   Trileaflet; normal thickness leaflets. Mobility was not restricted.  Doppler:  Transvalvular velocity was within the normal range. There was no stenosis. There was no regurgitation.  ------------------------------------------------------------------- Aorta:  Aortic root: The aortic root was normal in size.  ------------------------------------------------------------------- Mitral valve:   Structurally normal valve.   Mobility was not restricted.  Doppler:  Transvalvular velocity was within the normal range. There was no evidence for stenosis. There was trivial regurgitation.  ------------------------------------------------------------------- Left atrium:  The atrium was normal in size.  ------------------------------------------------------------------- Right ventricle:  The cavity size was normal. Wall thickness was normal. Systolic function was normal.  ------------------------------------------------------------------- Pulmonic valve:    Structurally normal valve.   Cusp separation was normal.  Doppler:  Transvalvular velocity was within the normal range. There was no evidence for stenosis. There was mild regurgitation.  ------------------------------------------------------------------- Tricuspid valve:   Structurally normal valve.    Doppler: Transvalvular velocity was within the normal range. There was mild regurgitation.  ------------------------------------------------------------------- Pulmonary artery:   The main pulmonary artery was normal-sized. Systolic pressure was within the normal range.  ------------------------------------------------------------------- Right atrium:  The atrium was normal in size.  ------------------------------------------------------------------- Pericardium:  There was no  pericardial effusion.  ------------------------------------------------------------------- Systemic veins: Inferior vena cava: The vessel was normal in size.  ------------------------------------------------------------------- Measurements  Left ventricle                          Value        Reference LV ID, ED, PLAX chordal         (L)     36.1  mm     43 - 52 LV ID, ES, PLAX chordal                 23.4  mm     23 - 38  LV fx shortening, PLAX chordal          35    %      >=29 LV PW thickness, ED                     12    mm     ---------- IVS/LV PW ratio, ED                     0.99         <=1.3 Stroke volume, 2D                       65    ml     ---------- Stroke volume/bsa, 2D                   27    ml/m^2 ---------- LV e&', lateral                          10.7  cm/s   ---------- LV E/e&', lateral                        5.26         ---------- LV e&', medial                           5.55  cm/s   ---------- LV E/e&', medial                         10.14        ---------- LV e&', average                          8.13  cm/s   ---------- LV E/e&', average                        6.93         ----------  Ventricular septum                      Value        Reference IVS thickness, ED                       11.9  mm     ----------  LVOT                                    Value        Reference LVOT ID, S                              18    mm     ---------- LVOT area                               2.54  cm^2   ---------- LVOT peak velocity, S                   117   cm/s   ---------- LVOT mean velocity, S  78.1  cm/s   ---------- LVOT VTI, S                             25.5  cm     ---------- LVOT peak gradient, S                   5     mm Hg  ----------  Aorta                                   Value        Reference Aortic root ID, ED                      30    mm     ----------  Left atrium                             Value        Reference LA ID, A-P, ES                           35    mm     ---------- LA ID/bsa, A-P                          1.46  cm/m^2 <=2.2 LA volume, S                            63.5  ml     ---------- LA volume/bsa, S                        26.5  ml/m^2 ---------- LA volume, ES, 1-p A4C                  51.9  ml     ---------- LA volume/bsa, ES, 1-p A4C              21.7  ml/m^2 ---------- LA volume, ES, 1-p A2C                  66.9  ml     ---------- LA volume/bsa, ES, 1-p A2C              28    ml/m^2 ----------  Mitral valve                            Value        Reference Mitral E-wave peak velocity             56.3  cm/s   ---------- Mitral A-wave peak velocity             74.2  cm/s   ---------- Mitral deceleration time        (H)     310   ms     150 - 230 Mitral E/A ratio, peak                  0.8          ----------  Right atrium  Value        Reference RA ID, S-I, ES, A4C             (H)     60.5  mm     34 - 49 RA area, ES, A4C                (H)     22.2  cm^2   8.3 - 19.5 RA volume, ES, A/L                      64.2  ml     ---------- RA volume/bsa, ES, A/L                  26.8  ml/m^2 ----------  Systemic veins                          Value        Reference Estimated CVP                           3     mm Hg  ----------  Right ventricle                         Value        Reference RV ID, minor axis, ED, A4C base         38.1  mm     ---------- TAPSE                                   17.8  mm     ---------- RV s&', lateral, S                       12.3  cm/s   ----------  Legend: (L)  and  (H)  mark values outside specified reference range.  ------------------------------------------------------------------- Prepared and Electronically Authenticated by  Donato Schultz, M.D. 2019-03-12T10:57:03     CT SCANS  CT CORONARY MORPH W/CTA COR W/SCORE 04/20/2017  Addendum 04/20/2017  6:01 PM ADDENDUM REPORT: 04/20/2017 17:59  CLINICAL DATA:  77 year old female with chest  pain.  EXAM: Cardiac/Coronary  CT  TECHNIQUE: The patient was scanned on a [employer].  FINDINGS: A 120 kV prospective scan was triggered in the descending thoracic aorta at 111 HU's. Axial non-contrast 3 mm slices were carried out through the heart. The data set was analyzed on a dedicated work station and scored using the Agatson method. Gantry rotation speed was 250 msecs and collimation was .6 mm. 10 mg of iv Metoprolol and 0.8 mg of sl NTG was given. The 3D data set was reconstructed in 5% intervals of the 67-82 % of the R-R cycle. Diastolic phases were analyzed on a dedicated work station using MPR, MIP and VRT modes. The patient received 80 cc of contrast.  Aorta:  Normal size.  No calcifications.  No dissection.  Aortic Valve:  Trileaflet.  No calcifications.  Coronary Arteries:  Normal coronary origin.  Left dominance.  Left main is a large artery that gives rise to LAD and LCX arteries. LM has no plaque.  LAD is a large and long artery that gives rise to two small diagonal arteries, wraps around the apex and partially supplies PDA territory. There is minimal calcified plaque  in the proximal segment with associated stenosis 0-25%.  LCX is a large dominant artery that gives rise to one large OM1 branch, PLA and a small PDA. There is no plaque.  RCA is a small non-dominant artery.  Other findings:  Normal pulmonary vein drainage into the left atrium.  Normal let atrial appendage without a thrombus.  Normal size of the pulmonary artery.  IMPRESSION: 1. Coronary calcium score of 6. This was 45 percentile for age and sex matched control.  2. Normal coronary origin with right dominance.  3. 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   Electronically Signed By: Tobias Alexander On: 04/20/2017 17:59  Narrative EXAM: OVER-READ INTERPRETATION  CT CHEST  The following report is an over-read performed by radiologist Dr. Genevive Bi of  [facility] on 04/20/2017. This over-read does not include interpretation of cardiac or coronary anatomy or pathology. The coronary calcium score/coronary CTA interpretation by the cardiologist is attached.  COMPARISON:  None.  FINDINGS: Limited view of the lung parenchyma demonstrates no suspicious nodularity. Airways are normal.  Limited view of the mediastinum demonstrates no adenopathy. Esophagus normal.  Limited view of the upper abdomen unremarkable.  Limited view of the skeleton and chest wall is unremarkable.  IMPRESSION: No significant extracardiac findings  Electronically Signed: By: Genevive Bi M.D. On: 04/20/2017 15:52          Risk Assessment/Calculations:     No BP recorded.  {Refresh Note OR Click here to enter BP  :1}***       Physical Exam:   VS:  There were no vitals taken for this visit.   Wt Readings from Last 3 Encounters:  04/14/22 257 lb (116.6 kg)  03/03/22 253 lb (114.8 kg)  09/03/21 243 lb (110.2 kg)    GEN: Well nourished, well developed in no acute distress NECK: No JVD; No carotid bruits CARDIAC: ***RRR, no murmurs, rubs, gallops RESPIRATORY:  Clear to auscultation without rales, wheezing or rhonchi  ABDOMEN: Soft, non-tender, non-distended EXTREMITIES:  No edema; No deformity   ASSESSMENT AND PLAN: .    Nonobstructive CAD/HLD, LDL goal less than 70-  HTN-  LBBB -     {Are you ordering a CV Procedure (e.g. stress test, cath, DCCV, TEE, etc)?   Press F2        :[id number]}  Dispo: ***  Signed, Alver Sorrow, NP

## 2022-10-14 DIAGNOSIS — Z0289 Encounter for other administrative examinations: Secondary | ICD-10-CM

## 2022-10-29 ENCOUNTER — Ambulatory Visit (INDEPENDENT_AMBULATORY_CARE_PROVIDER_SITE_OTHER): Payer: Medicare Other | Admitting: Family Medicine

## 2022-11-11 ENCOUNTER — Ambulatory Visit (HOSPITAL_BASED_OUTPATIENT_CLINIC_OR_DEPARTMENT_OTHER): Payer: Medicare Other | Admitting: Family

## 2022-11-11 ENCOUNTER — Encounter (HOSPITAL_BASED_OUTPATIENT_CLINIC_OR_DEPARTMENT_OTHER): Payer: Self-pay | Admitting: Family

## 2022-11-11 VITALS — BP 122/70 | HR 67 | Ht 65.0 in | Wt 260.0 lb

## 2022-11-11 DIAGNOSIS — I25118 Atherosclerotic heart disease of native coronary artery with other forms of angina pectoris: Secondary | ICD-10-CM

## 2022-11-11 DIAGNOSIS — E785 Hyperlipidemia, unspecified: Secondary | ICD-10-CM | POA: Diagnosis not present

## 2022-11-11 DIAGNOSIS — Z6841 Body Mass Index (BMI) 40.0 and over, adult: Secondary | ICD-10-CM

## 2022-11-11 DIAGNOSIS — I10 Essential (primary) hypertension: Secondary | ICD-10-CM | POA: Diagnosis not present

## 2022-11-11 MED ORDER — ROSUVASTATIN CALCIUM 20 MG PO TABS: 20.0000 mg | ORAL_TABLET | Freq: Every day | ORAL | 3 refills | Status: DC

## 2022-11-11 NOTE — Progress Notes (Signed)
Cardiology Office Note:  .   Date:  11/11/2022  ID:  [name], DOB 02-21-1945, MRN [id number] PCP: Emilio Aspen, MD  [facility] Providers Cardiologist:  None    History of Present Illness: .   [name] is a 77 y.o. female with hx of nonobstructive coronary artery disease, HLD, HTN, CKD 3a, LE edema.  Coronary CTA 04/2017 chronic calcium score of 6 placing her in the 45th percentile for age and sex matched control with minimal nonobstructive CAD in the proximal LAD.  Echo 04/2017 LVEF 55 to 60%, mild LVH, grade 1 diastolic dysfunction, trivial MR, mild TR.  Presents today for follow-up independently. Palpitations occur once per month lasting only seconds often resolved by deep breathing.  This is overall not bothersome. Reports bilatearl LE edema. She is pending left knee surgery with no set date (has to get BMI down below 40) and notes left leg might be slightly worse regarding swelling. Swelling worse by end of day. Exercising by walking. One of the residents where she lives is trying to convince her to go to the [facility]. [facility]-establishing with [facility]. Checking BP intermittently with readings at goal <130/80. Notes she is not taking statin as her mom passed of liver disease so she was concerned about taking - we reviewed secondary prevention of CAD and monitoring of liver function while on statin.   ROS: Please see the history of present illness.    All other systems reviewed and are negative.   Studies Reviewed: .        Cardiac Studies & Procedures     STRESS TESTS  NM MYOCAR MULTI W/SPECT W 06/18/2014  Narrative CLINICAL DATA:  Chest pain, hypertension, shortness of breath, history of rapid heart rate per patient  EXAM: MYOCARDIAL IMAGING WITH SPECT (REST AND PHARMACOLOGIC-STRESS)  GATED LEFT VENTRICULAR WALL MOTION STUDY  LEFT VENTRICULAR EJECTION FRACTION  TECHNIQUE: Standard myocardial SPECT imaging was performed after  resting intravenous injection of 10 mCi Tc-34m sestamibi. Subsequently, intravenous infusion of Lexiscan was performed under the supervision of the Cardiology staff. At peak effect of the drug, 30 mCi Tc-16m sestamibi was injected intravenously and standard myocardial SPECT imaging was performed. Quantitative gated imaging was also performed to evaluate left ventricular wall motion, and estimate left ventricular ejection fraction.  COMPARISON:  None  FINDINGS: Perfusion: Small focus of non reversible decreased myocardial perfusion at the anteroseptal portion of LEFT ventricular apex, potentially related to breast attenuation. No additional myocardial perfusion defects.  Wall Motion: Normal left ventricular wall motion. No left ventricular dilation.  Left Ventricular Ejection Fraction: 65 %  End diastolic volume 68 ml  End systolic volume 24 ml  IMPRESSION: 1. Small non reversible area of decreased myocardial perfusion at the anteroseptal portion of LEFT ventricular apex, potentially representing a breast attenuation artifact. No other perfusion defects identified.  2. Normal left ventricular wall motion.  3. Left ventricular ejection fraction 65%  4. Low-risk stress test findings*.  *2012 Appropriate Use Criteria for Coronary Revascularization Focused Update: J Am Coll Cardiol. 2012;59(9):857-881. http://content.dementiazones.com.aspx?articleid=1201161   Electronically Signed By: Ulyses Southward M.D. On: 06/18/2014 12:36   ECHOCARDIOGRAM  ECHOCARDIOGRAM COMPLETE 04/21/2017  Narrative *[place]* *[facility]* [address] [place], [place] [address] [phone]  ------------------------------------------------------------------- Transthoracic Echocardiography  Patient:    Diana, French MR #:       [id number] Study Date: 04/21/2017 Gender:     F Age:  71 Height:     167.6 cm Weight:     116.9 kg BSA:        2.39 m^2 Pt.  Status: Room:       3E17C  ATTENDING    Joaquin Courts SONOGRAPHER  Roosvelt Maser, RDCS PERFORMING   [facility], Inpatient ORDERING     [facility], Roe Rutherford REFERRING    Marcelino Duster  cc:  ------------------------------------------------------------------- LV EF: 55% -   60%  ------------------------------------------------------------------- Indications:      Chest pain 786.51.  ------------------------------------------------------------------- History:   PMH:  LBBB. Negative troponins.  Risk factors: Hypertension.  ------------------------------------------------------------------- Study Conclusions  - Left ventricle: The cavity size was normal. Wall thickness was increased in a pattern of mild LVH. Systolic function was normal. The estimated ejection fraction was in the range of 55% to 60%. Wall motion was normal; there were no regional wall motion abnormalities. Doppler parameters are consistent with abnormal left ventricular relaxation (grade 1 diastolic dysfunction). - Mitral valve: There was trivial regurgitation. - Tricuspid valve: There was mild regurgitation.  ------------------------------------------------------------------- Study data:  No prior study was available for comparison.  Study status:  Routine.  Procedure:  The patient reported no pain pre or post test. Transthoracic echocardiography. Image quality was adequate.  Study completion:  There were no complications. Transthoracic echocardiography.  M-mode, complete 2D, spectral Doppler, and color Doppler.  Birthdate:  Patient birthdate: [birth year].  Age:  Patient is 77 yr old.  Sex:  Gender: female. BMI: 41.6 kg/m^2.  Blood pressure:     137/67  Patient status: Inpatient.  Study date:  Study date: 04/21/2017. Study time:  08:42 AM.  -------------------------------------------------------------------  ------------------------------------------------------------------- Left ventricle:  The cavity size was normal. Wall thickness was increased in a pattern of mild LVH. Systolic function was normal. The estimated ejection fraction was in the range of 55% to 60%. Wall motion was normal; there were no regional wall motion abnormalities. Doppler parameters are consistent with abnormal left ventricular relaxation (grade 1 diastolic dysfunction).  ------------------------------------------------------------------- Aortic valve:   Trileaflet; normal thickness leaflets. Mobility was not restricted.  Doppler:  Transvalvular velocity was within the normal range. There was no stenosis. There was no regurgitation.  ------------------------------------------------------------------- Aorta:  Aortic root: The aortic root was normal in size.  ------------------------------------------------------------------- Mitral valve:   Structurally normal valve.   Mobility was not restricted.  Doppler:  Transvalvular velocity was within the normal range. There was no evidence for stenosis. There was trivial regurgitation.  ------------------------------------------------------------------- Left atrium:  The atrium was normal in size.  ------------------------------------------------------------------- Right ventricle:  The cavity size was normal. Wall thickness was normal. Systolic function was normal.  ------------------------------------------------------------------- Pulmonic valve:    Structurally normal valve.   Cusp separation was normal.  Doppler:  Transvalvular velocity was within the normal range. There was no evidence for stenosis. There was mild regurgitation.  ------------------------------------------------------------------- Tricuspid valve:   Structurally normal valve.    Doppler: Transvalvular velocity was  within the normal range. There was mild regurgitation.  ------------------------------------------------------------------- Pulmonary artery:   The main pulmonary artery was normal-sized. Systolic pressure was within the normal range.  ------------------------------------------------------------------- Right atrium:  The atrium was normal in size.  ------------------------------------------------------------------- Pericardium:  There was no pericardial effusion.  ------------------------------------------------------------------- Systemic veins: Inferior vena cava: The vessel was normal in size.  ------------------------------------------------------------------- Measurements  Left ventricle  71 Height:     167.6 cm Weight:     116.9 kg BSA:        2.39 m^2 Pt.  Status: Room:       3E17C  ATTENDING    Joaquin Courts SONOGRAPHER  Roosvelt Maser, RDCS PERFORMING   [facility], Inpatient ORDERING     [facility], Roe Rutherford REFERRING    Marcelino Duster  cc:  ------------------------------------------------------------------- LV EF: 55% -   60%  ------------------------------------------------------------------- Indications:      Chest pain 786.51.  ------------------------------------------------------------------- History:   PMH:  LBBB. Negative troponins.  Risk factors: Hypertension.  ------------------------------------------------------------------- Study Conclusions  - Left ventricle: The cavity size was normal. Wall thickness was increased in a pattern of mild LVH. Systolic function was normal. The estimated ejection fraction was in the range of 55% to 60%. Wall motion was normal; there were no regional wall motion abnormalities. Doppler parameters are consistent with abnormal left ventricular relaxation (grade 1 diastolic dysfunction). - Mitral valve: There was trivial regurgitation. - Tricuspid valve: There was mild regurgitation.  ------------------------------------------------------------------- Study data:  No prior study was available for comparison.  Study status:  Routine.  Procedure:  The patient reported no pain pre or post test. Transthoracic echocardiography. Image quality was adequate.  Study completion:  There were no complications. Transthoracic echocardiography.  M-mode, complete 2D, spectral Doppler, and color Doppler.  Birthdate:  Patient birthdate: [birth year].  Age:  Patient is 77 yr old.  Sex:  Gender: female. BMI: 41.6 kg/m^2.  Blood pressure:     137/67  Patient status: Inpatient.  Study date:  Study date: 04/21/2017. Study time:  08:42 AM.  -------------------------------------------------------------------  ------------------------------------------------------------------- Left ventricle:  The cavity size was normal. Wall thickness was increased in a pattern of mild LVH. Systolic function was normal. The estimated ejection fraction was in the range of 55% to 60%. Wall motion was normal; there were no regional wall motion abnormalities. Doppler parameters are consistent with abnormal left ventricular relaxation (grade 1 diastolic dysfunction).  ------------------------------------------------------------------- Aortic valve:   Trileaflet; normal thickness leaflets. Mobility was not restricted.  Doppler:  Transvalvular velocity was within the normal range. There was no stenosis. There was no regurgitation.  ------------------------------------------------------------------- Aorta:  Aortic root: The aortic root was normal in size.  ------------------------------------------------------------------- Mitral valve:   Structurally normal valve.   Mobility was not restricted.  Doppler:  Transvalvular velocity was within the normal range. There was no evidence for stenosis. There was trivial regurgitation.  ------------------------------------------------------------------- Left atrium:  The atrium was normal in size.  ------------------------------------------------------------------- Right ventricle:  The cavity size was normal. Wall thickness was normal. Systolic function was normal.  ------------------------------------------------------------------- Pulmonic valve:    Structurally normal valve.   Cusp separation was normal.  Doppler:  Transvalvular velocity was within the normal range. There was no evidence for stenosis. There was mild regurgitation.  ------------------------------------------------------------------- Tricuspid valve:   Structurally normal valve.    Doppler: Transvalvular velocity was  within the normal range. There was mild regurgitation.  ------------------------------------------------------------------- Pulmonary artery:   The main pulmonary artery was normal-sized. Systolic pressure was within the normal range.  ------------------------------------------------------------------- Right atrium:  The atrium was normal in size.  ------------------------------------------------------------------- Pericardium:  There was no pericardial effusion.  ------------------------------------------------------------------- Systemic veins: Inferior vena cava: The vessel was normal in size.  ------------------------------------------------------------------- Measurements  Left ventricle  70 Height:     167.6 cm Weight:     116.9 kg BSA:        2.39 m^2 Pt.  Status: Room:       3E17C  ATTENDING    Joaquin Courts SONOGRAPHER  Roosvelt Maser, RDCS PERFORMING   [facility], Inpatient ORDERING     [facility], Roe Rutherford REFERRING    Marcelino Duster  cc:  ------------------------------------------------------------------- LV EF: 55% -   60%  ------------------------------------------------------------------- Indications:      Chest pain 786.51.  ------------------------------------------------------------------- History:   PMH:  LBBB. Negative troponins.  Risk factors: Hypertension.  ------------------------------------------------------------------- Study Conclusions  - Left ventricle: The cavity size was normal. Wall thickness was increased in a pattern of mild LVH. Systolic function was normal. The estimated ejection fraction was in the range of 55% to 60%. Wall motion was normal; there were no regional wall motion abnormalities. Doppler parameters are consistent with abnormal left ventricular relaxation (grade 1 diastolic dysfunction). - Mitral valve: There was trivial regurgitation. - Tricuspid valve: There was mild regurgitation.  ------------------------------------------------------------------- Study data:  No prior study was available for comparison.  Study status:  Routine.  Procedure:  The patient reported no pain pre or post test. Transthoracic echocardiography. Image quality was adequate.  Study completion:  There were no complications. Transthoracic echocardiography.  M-mode, complete 2D, spectral Doppler, and color Doppler.  Birthdate:  Patient birthdate: [birth year].  Age:  Patient is 77 yr old.  Sex:  Gender: female. BMI: 41.6 kg/m^2.  Blood pressure:     137/67  Patient status: Inpatient.  Study date:  Study date: 04/21/2017. Study time:  08:42 AM.  -------------------------------------------------------------------  ------------------------------------------------------------------- Left ventricle:  The cavity size was normal. Wall thickness was increased in a pattern of mild LVH. Systolic function was normal. The estimated ejection fraction was in the range of 55% to 60%. Wall motion was normal; there were no regional wall motion abnormalities. Doppler parameters are consistent with abnormal left ventricular relaxation (grade 1 diastolic dysfunction).  ------------------------------------------------------------------- Aortic valve:   Trileaflet; normal thickness leaflets. Mobility was not restricted.  Doppler:  Transvalvular velocity was within the normal range. There was no stenosis. There was no regurgitation.  ------------------------------------------------------------------- Aorta:  Aortic root: The aortic root was normal in size.  ------------------------------------------------------------------- Mitral valve:   Structurally normal valve.   Mobility was not restricted.  Doppler:  Transvalvular velocity was within the normal range. There was no evidence for stenosis. There was trivial regurgitation.  ------------------------------------------------------------------- Left atrium:  The atrium was normal in size.  ------------------------------------------------------------------- Right ventricle:  The cavity size was normal. Wall thickness was normal. Systolic function was normal.  ------------------------------------------------------------------- Pulmonic valve:    Structurally normal valve.   Cusp separation was normal.  Doppler:  Transvalvular velocity was within the normal range. There was no evidence for stenosis. There was mild regurgitation.  ------------------------------------------------------------------- Tricuspid valve:   Structurally normal valve.    Doppler: Transvalvular velocity was  within the normal range. There was mild regurgitation.  ------------------------------------------------------------------- Pulmonary artery:   The main pulmonary artery was normal-sized. Systolic pressure was within the normal range.  ------------------------------------------------------------------- Right atrium:  The atrium was normal in size.  ------------------------------------------------------------------- Pericardium:  There was no pericardial effusion.  ------------------------------------------------------------------- Systemic veins: Inferior vena cava: The vessel was normal in size.  ------------------------------------------------------------------- Measurements  Left ventricle  70 Height:     167.6 cm Weight:     116.9 kg BSA:        2.39 m^2 Pt.  Status: Room:       3E17C  ATTENDING    Joaquin Courts SONOGRAPHER  Roosvelt Maser, RDCS PERFORMING   [facility], Inpatient ORDERING     [facility], Roe Rutherford REFERRING    Marcelino Duster  cc:  ------------------------------------------------------------------- LV EF: 55% -   60%  ------------------------------------------------------------------- Indications:      Chest pain 786.51.  ------------------------------------------------------------------- History:   PMH:  LBBB. Negative troponins.  Risk factors: Hypertension.  ------------------------------------------------------------------- Study Conclusions  - Left ventricle: The cavity size was normal. Wall thickness was increased in a pattern of mild LVH. Systolic function was normal. The estimated ejection fraction was in the range of 55% to 60%. Wall motion was normal; there were no regional wall motion abnormalities. Doppler parameters are consistent with abnormal left ventricular relaxation (grade 1 diastolic dysfunction). - Mitral valve: There was trivial regurgitation. - Tricuspid valve: There was mild regurgitation.  ------------------------------------------------------------------- Study data:  No prior study was available for comparison.  Study status:  Routine.  Procedure:  The patient reported no pain pre or post test. Transthoracic echocardiography. Image quality was adequate.  Study completion:  There were no complications. Transthoracic echocardiography.  M-mode, complete 2D, spectral Doppler, and color Doppler.  Birthdate:  Patient birthdate: [birth year].  Age:  Patient is 77 yr old.  Sex:  Gender: female. BMI: 41.6 kg/m^2.  Blood pressure:     137/67  Patient status: Inpatient.  Study date:  Study date: 04/21/2017. Study time:  08:42 AM.  -------------------------------------------------------------------  ------------------------------------------------------------------- Left ventricle:  The cavity size was normal. Wall thickness was increased in a pattern of mild LVH. Systolic function was normal. The estimated ejection fraction was in the range of 55% to 60%. Wall motion was normal; there were no regional wall motion abnormalities. Doppler parameters are consistent with abnormal left ventricular relaxation (grade 1 diastolic dysfunction).  ------------------------------------------------------------------- Aortic valve:   Trileaflet; normal thickness leaflets. Mobility was not restricted.  Doppler:  Transvalvular velocity was within the normal range. There was no stenosis. There was no regurgitation.  ------------------------------------------------------------------- Aorta:  Aortic root: The aortic root was normal in size.  ------------------------------------------------------------------- Mitral valve:   Structurally normal valve.   Mobility was not restricted.  Doppler:  Transvalvular velocity was within the normal range. There was no evidence for stenosis. There was trivial regurgitation.  ------------------------------------------------------------------- Left atrium:  The atrium was normal in size.  ------------------------------------------------------------------- Right ventricle:  The cavity size was normal. Wall thickness was normal. Systolic function was normal.  ------------------------------------------------------------------- Pulmonic valve:    Structurally normal valve.   Cusp separation was normal.  Doppler:  Transvalvular velocity was within the normal range. There was no evidence for stenosis. There was mild regurgitation.  ------------------------------------------------------------------- Tricuspid valve:   Structurally normal valve.    Doppler: Transvalvular velocity was  within the normal range. There was mild regurgitation.  ------------------------------------------------------------------- Pulmonary artery:   The main pulmonary artery was normal-sized. Systolic pressure was within the normal range.  ------------------------------------------------------------------- Right atrium:  The atrium was normal in size.  ------------------------------------------------------------------- Pericardium:  There was no pericardial effusion.  ------------------------------------------------------------------- Systemic veins: Inferior vena cava: The vessel was normal in size.  ------------------------------------------------------------------- Measurements  Left ventricle  Cardiology Office Note:  .   Date:  11/11/2022  ID:  [name], DOB 02-21-1945, MRN [id number] PCP: Emilio Aspen, MD  [facility] Providers Cardiologist:  None    History of Present Illness: .   [name] is a 77 y.o. female with hx of nonobstructive coronary artery disease, HLD, HTN, CKD 3a, LE edema.  Coronary CTA 04/2017 chronic calcium score of 6 placing her in the 45th percentile for age and sex matched control with minimal nonobstructive CAD in the proximal LAD.  Echo 04/2017 LVEF 55 to 60%, mild LVH, grade 1 diastolic dysfunction, trivial MR, mild TR.  Presents today for follow-up independently. Palpitations occur once per month lasting only seconds often resolved by deep breathing.  This is overall not bothersome. Reports bilatearl LE edema. She is pending left knee surgery with no set date (has to get BMI down below 40) and notes left leg might be slightly worse regarding swelling. Swelling worse by end of day. Exercising by walking. One of the residents where she lives is trying to convince her to go to the [facility]. [facility]establ[facility] with [facility]. Checking BP intermittently with readings at goal <130/80. Notes she is not taking statin as her mom passed of liver disease so she was concerned about taking - we reviewed secondary prevention of CAD and monitoring of liver function while on statin.   ROS: Please see the history of present illness.    All other systems reviewed and are negative.   Studies Reviewed: .        Cardiac Studies & Procedures     STRESS TESTS  NM MYOCAR MULTI W/SPECT W 06/18/2014  Narrative CLINICAL DATA:  Chest pain, hypertension, shortness of breath, history of rapid heart rate per patient  EXAM: MYOCARDIAL IMAGING WITH SPECT (REST AND PHARMACOLOGIC-STRESS)  GATED LEFT VENTRICULAR WALL MOTION STUDY  LEFT VENTRICULAR EJECTION FRACTION  TECHNIQUE: Standard myocardial SPECT imaging was performed after  resting intravenous injection of 10 mCi Tc-34m sestamibi. Subsequently, intravenous infusion of Lexiscan was performed under the supervision of the Cardiology staff. At peak effect of the drug, 30 mCi Tc-16m sestamibi was injected intravenously and standard myocardial SPECT imaging was performed. Quantitative gated imaging was also performed to evaluate left ventricular wall motion, and estimate left ventricular ejection fraction.  COMPARISON:  None  FINDINGS: Perfusion: Small focus of non reversible decreased myocardial perfusion at the anteroseptal portion of LEFT ventricular apex, potentially related to breast attenuation. No additional myocardial perfusion defects.  Wall Motion: Normal left ventricular wall motion. No left ventricular dilation.  Left Ventricular Ejection Fraction: 65 %  End diastolic volume 68 ml  End systolic volume 24 ml  IMPRESSION: 1. Small non reversible area of decreased myocardial perfusion at the anteroseptal portion of LEFT ventricular apex, potentially representing a breast attenuation artifact. No other perfusion defects identified.  2. Normal left ventricular wall motion.  3. Left ventricular ejection fraction 65%  4. Low-risk stress test findings*.  *2012 Appropriate Use Criteria for Coronary Revascularization Focused Update: J Am Coll Cardiol. 2012;59(9):857-881. http://content.dementiazones.com.aspx?articleid=1201161   Electronically Signed By: Ulyses Southward M.D. On: 06/18/2014 12:36   ECHOCARDIOGRAM  ECHOCARDIOGRAM COMPLETE 04/21/2017  Narrative *[facility]* *[facility]* [address] [place], [place] [address] [phone]  ------------------------------------------------------------------- Transthoracic Echocardiography  Patient:    Diana, French MR #:       [id number] Study Date: 04/21/2017 Gender:     F Age:  Cardiology Office Note:  .   Date:  11/11/2022  ID:  [name], DOB 02-21-1945, MRN [id number] PCP: Emilio Aspen, MD  [facility] Providers Cardiologist:  None    History of Present Illness: .   [name] is a 77 y.o. female with hx of nonobstructive coronary artery disease, HLD, HTN, CKD 3a, LE edema.  Coronary CTA 04/2017 chronic calcium score of 6 placing her in the 45th percentile for age and sex matched control with minimal nonobstructive CAD in the proximal LAD.  Echo 04/2017 LVEF 55 to 60%, mild LVH, grade 1 diastolic dysfunction, trivial MR, mild TR.  Presents today for follow-up independently. Palpitations occur once per month lasting only seconds often resolved by deep breathing.  This is overall not bothersome. Reports bilatearl LE edema. She is pending left knee surgery with no set date (has to get BMI down below 40) and notes left leg might be slightly worse regarding swelling. Swelling worse by end of day. Exercising by walking. One of the residents where she lives is trying to convince her to go to the [facility]. [facility]. Checking BP intermittently with readings at goal <130/80. Notes she is not taking statin as her mom passed of liver disease so she was concerned about taking - we reviewed secondary prevention of CAD and monitoring of liver function while on statin.   ROS: Please see the history of present illness.    All other systems reviewed and are negative.   Studies Reviewed: .        Cardiac Studies & Procedures     STRESS TESTS  NM MYOCAR MULTI W/SPECT W 06/18/2014  Narrative CLINICAL DATA:  Chest pain, hypertension, shortness of breath, history of rapid heart rate per patient  EXAM: MYOCARDIAL IMAGING WITH SPECT (REST AND PHARMACOLOGIC-STRESS)  GATED LEFT VENTRICULAR WALL MOTION STUDY  LEFT VENTRICULAR EJECTION FRACTION  TECHNIQUE: Standard myocardial SPECT imaging was performed after  resting intravenous injection of 10 mCi Tc-34m sestamibi. Subsequently, intravenous infusion of Lexiscan was performed under the supervision of the Cardiology staff. At peak effect of the drug, 30 mCi Tc-16m sestamibi was injected intravenously and standard myocardial SPECT imaging was performed. Quantitative gated imaging was also performed to evaluate left ventricular wall motion, and estimate left ventricular ejection fraction.  COMPARISON:  None  FINDINGS: Perfusion: Small focus of non reversible decreased myocardial perfusion at the anteroseptal portion of LEFT ventricular apex, potentially related to breast attenuation. No additional myocardial perfusion defects.  Wall Motion: Normal left ventricular wall motion. No left ventricular dilation.  Left Ventricular Ejection Fraction: 65 %  End diastolic volume 68 ml  End systolic volume 24 ml  IMPRESSION: 1. Small non reversible area of decreased myocardial perfusion at the anteroseptal portion of LEFT ventricular apex, potentially representing a breast attenuation artifact. No other perfusion defects identified.  2. Normal left ventricular wall motion.  3. Left ventricular ejection fraction 65%  4. Low-risk stress test findings*.  *2012 Appropriate Use Criteria for Coronary Revascularization Focused Update: J Am Coll Cardiol. 2012;59(9):857-881. http://content.dementiazones.com.aspx?articleid=1201161   Electronically Signed By: Ulyses Southward M.D. On: 06/18/2014 12:36   ECHOCARDIOGRAM  ECHOCARDIOGRAM COMPLETE 04/21/2017  Narrative *[facility]* *[facility]* [address] [place], [place] [address] [phone]  ------------------------------------------------------------------- Transthoracic Echocardiography  Patient:    Diana, French MR #:       [id number] Study Date: 04/21/2017 Gender:     F Age:

## 2022-11-11 NOTE — Patient Instructions (Addendum)
Medication Instructions:  Your physician has recommended you make the following change in your medication:  START Rosuvastatin 20mg  daily  *If you need a refill on your cardiac medications before your next appointment, please call your pharmacy*   Lab Work: Your physician recommends that you return for lab work in 6 weeks for fasting lipid panel, CMP (liver function)  Please return for Lab work. You may come to the...   [facility] (3rd floor) [address], [place], [place] [address]  Open: 8am-Noon and 1pm-4:30pm  Please ring the doorbell on the small table when you exit the elevator and the Lab Tech will come get you  [facility] at [facility] [address], [place], [place] [address] Open: 8am-1pm, then 2pm-4:30pm   [facility]- Please see attached locations sheet stapled to your lab work with address and hours.    If you have labs (blood work) drawn today and your tests are completely normal, you will receive your results only by: MyChart Message (if you have MyChart) OR A paper copy in the mail If you have any lab test that is abnormal or we need to change your treatment, we will call you to review the results.   Testing/Procedures: Your EKG today was stable compared to previous.   Follow-Up: At [facility], you and your health needs are our priority.  As part of our continuing mission to provide you with exceptional heart care, we have created designated Provider Care Teams.  These Care Teams include your primary Cardiologist (physician) and Advanced Practice Providers (APPs -  Physician Assistants and Nurse Practitioners) who all work together to provide you with the care you need, when you need it.  We recommend signing up for the patient portal called "MyChart".  Sign up information is provided on this After Visit Summary.  MyChart is used to connect with patients for Virtual Visits (Telemedicine).  Patients are  able to view lab/test results, encounter notes, upcoming appointments, etc.  Non-urgent messages can be sent to your provider as well.   To learn more about what you can do with MyChart, go to [email or web address].    Your next appointment:   3-4 month(s)  Provider:   K. Italy Hilty, MD or Gillian Shields, NP    Other Instructions  To prevent or reduce lower extremity swelling: Eat a low salt diet. Salt makes the body hold onto extra fluid which causes swelling. Sit with legs elevated. For example, in the recliner or on an ottoman.  Wear knee-high compression stockings during the daytime. Ones labeled 15-20 mmHg provide good compression.

## 2022-11-12 ENCOUNTER — Ambulatory Visit (INDEPENDENT_AMBULATORY_CARE_PROVIDER_SITE_OTHER): Payer: Medicare Other | Admitting: Family Medicine

## 2022-11-13 ENCOUNTER — Telehealth: Payer: Self-pay

## 2022-11-13 NOTE — Telephone Encounter (Signed)
Called re: PREP program referral, left voicemail requesting return call.  

## 2022-11-18 ENCOUNTER — Telehealth: Payer: Self-pay

## 2022-11-18 NOTE — Telephone Encounter (Signed)
Called again re: PREP program referral, left voicemail requesting return call

## 2022-11-18 NOTE — Telephone Encounter (Signed)
She returned my call, explained PREP, would like to attend next class on October 14 at [facility], every M/W 12:30-1:45; assessment visit scheduled for 10/9 at 1:30

## 2022-11-19 NOTE — Progress Notes (Signed)
[facility] PREP Evaluation  Patient Details  Name: [name] MRN: [id number] Date of Birth: [birth year] Age: 77 y.o. PCP: Emilio Aspen, MD  Vitals:   11/19/22 1429  BP: (!) 158/78  Pulse: 60  SpO2: 98%  Weight: 265 lb 3.2 oz (120.3 kg)     [facility] Eval - 11/19/22 1400       [facility] "PREP" Location   [facility] "PREP" Location [name]      Referral    Referring Provider Walker    Reason for referral Hypertension;Inactivity;Obesitity/Overweight    Program Start Date 11/24/22      Measurement   Waist Circumference 55 inches    Hip Circumference 58 inches    Body fat --   E4     Information for Trainer   Goals --   Lose 10 pounds by end of program; would like to lose weight to have TKA next spring; establish exercise/strength training routine/program   Current Exercise walking    Orthopedic Concerns --   s/p R THA; needs L TKA, OA R knee   Pertinent Medical History --   HTN, OA, CKD   Restrictions/Precautions Assistive device    Medications that affect exercise Beta blocker      Timed Up and Go (TUGS)   Timed Up and Go Moderate risk 10-12 seconds      Mobility and Daily Activities   I find it easy to walk up or down two or more flights of stairs. 1    I have no trouble taking out the trash. 4    I do housework such as vacuuming and dusting on my own without difficulty. 4    I can easily lift a gallon of milk (8lbs). 4    I can easily walk a mile. 1    I have no trouble reaching into high cupboards or reaching down to pick up something from the floor. 2    I do not have trouble doing out-door work such as Loss adjuster, chartered, raking leaves, or gardening. 1      Mobility and Daily Activities   I feel younger than my age. 2    I feel independent. 4    I feel energetic. 2    I live an active life.  2    I feel strong. 2    I feel healthy. 2    I feel active as other people my age. 2      How fit and strong are you.   Fit and Strong Total Score 33             Past Medical History:  Diagnosis Date   Anxiety    Arthritis    Back pain    Edema of both lower extremities    Fluid retention    Food allergy    GERD (gastroesophageal reflux disease)    Hyperlipidemia    Hypertension    Knee pain    LBBB (left bundle branch block)    Osteoarthritis    Pre-diabetes    Shoulder pain    Sleep apnea    possibly   Spinal stenosis    Past Surgical History:  Procedure Laterality Date   ABDOMINAL HYSTERECTOMY  1970's   ABDOMINAL HYSTERECTOMY     APPENDECTOMY  1970's   with hysterectomy   APPENDECTOMY     with hysterectomy   COLONOSCOPY     COLONOSCOPY WITH PROPOFOL N/A 10/04/2013   Procedure: COLONOSCOPY WITH PROPOFOL;  Surgeon: Charolett Bumpers, MD;  Location: [facility];  Service: Endoscopy;  Laterality: N/A;   OOPHORECTOMY     TOTAL HIP ARTHROPLASTY  04/04/2011   Procedure: TOTAL HIP ARTHROPLASTY ANTERIOR APPROACH;  Surgeon: [name], MD;  Location: [facility];  Service: Orthopedics;  Laterality: Right;   TOTAL HIP ARTHROPLASTY     Social History   Tobacco Use  Smoking Status Former   Current packs/day: 0.00   Average packs/day: 1 pack/day for 20.0 years (20.0 ttl pk-yrs)   Types: Cigarettes   Start date: 05/23/1991   Quit date: 05/23/2011   Years since quitting: 11.5  Smokeless Tobacco Never  To begin PREP class at [facility] on October 14, every M/W 12:30-1:45  Sonia Baller 11/19/2022, 2:33 PM

## 2022-11-24 NOTE — Progress Notes (Signed)
[facility] PREP Weekly Session  Patient Details  Name: LESIA MONICA MRN: [id number] Date of Birth: Jun 26, 1945 Age: 77 y.o. PCP: Emilio Aspen, MD  There were no vitals filed for this visit.   [facility] Weekly seesion - 11/24/22 1500       [facility] "PREP" Location   [facility] "PREP" Location [name]      Weekly Session   Topic Discussed Goal setting and welcome to the program   Introducitons, review of notebook , tour of facility   Classes attended to date 1             Briawna Carver B Dontray Haberland 11/24/2022, 3:01 PM

## 2022-12-01 NOTE — Progress Notes (Signed)
[facility] PREP Weekly Session  Patient Details  Name: [name] MRN: [id number] Date of Birth: 13-Nov-1945 Age: 77 y.o. PCP: Emilio Aspen, MD  Vitals:   12/01/22 1355  Weight: 261 lb 12.8 oz (118.8 kg)     [facility] Weekly seesion - 12/01/22 1300       [facility] "PREP" Location   [facility] "PREP" Location [name]      Weekly Session   Topic Discussed Importance of resistance training;Other ways to be active   Goals: work up to 150 minutes/wk of cardio, strength training twice/wk, work up to 3 times/wk for 20-40 minutes; sitting no longer than 30 minutes during the day.   Classes attended to date 3             Latanza Pfefferkorn B Sharrie Self 12/01/2022, 1:56 PM

## 2022-12-08 NOTE — Progress Notes (Signed)
[facility] PREP Weekly Session  Patient Details  Name: [name] MRN: [id number] Date of Birth: 08/10/1945 Age: 77 y.o. PCP: Emilio Aspen, MD  Vitals:   12/08/22 1629  Weight: 260 lb 12.8 oz (118.3 kg)     [facility] Weekly seesion - 12/08/22 1600       [facility] "PREP" Location   [facility] "PREP" Location [name]      Weekly Session   Topic Discussed Healthy eating tips   Foods to reduce, foods to increase; eat the rainbow of colors; introduced to [employer] app   Minutes exercised this week 170 minutes    Classes attended to date 5             Rahmah Mccamy B Stefon Ramthun 12/08/2022, 4:31 PM

## 2022-12-10 ENCOUNTER — Ambulatory Visit (INDEPENDENT_AMBULATORY_CARE_PROVIDER_SITE_OTHER): Payer: Medicare Other | Admitting: Family Medicine

## 2022-12-10 ENCOUNTER — Encounter (INDEPENDENT_AMBULATORY_CARE_PROVIDER_SITE_OTHER): Payer: Self-pay

## 2022-12-15 NOTE — Progress Notes (Signed)
[facility] PREP Weekly Session  Patient Details  Name: RAFAELITA FOISTER MRN: [id number] Date of Birth: [birth year] Age: 77 y.o. PCP: Emilio Aspen, MD  Vitals:   12/15/22 1354  Weight: 259 lb 12.8 oz (117.8 kg)      Darcell Yacoub B Nadav Swindell 12/15/2022, 1:55 PM

## 2022-12-16 ENCOUNTER — Encounter (INDEPENDENT_AMBULATORY_CARE_PROVIDER_SITE_OTHER): Payer: Self-pay | Admitting: Family Medicine

## 2022-12-16 ENCOUNTER — Ambulatory Visit (INDEPENDENT_AMBULATORY_CARE_PROVIDER_SITE_OTHER): Payer: Medicare Other | Admitting: Family Medicine

## 2022-12-16 VITALS — BP 145/76 | HR 62 | Temp 98.5°F | Ht 65.0 in | Wt 257.0 lb

## 2022-12-16 DIAGNOSIS — R0602 Shortness of breath: Secondary | ICD-10-CM

## 2022-12-16 DIAGNOSIS — R739 Hyperglycemia, unspecified: Secondary | ICD-10-CM | POA: Diagnosis not present

## 2022-12-16 DIAGNOSIS — I2510 Atherosclerotic heart disease of native coronary artery without angina pectoris: Secondary | ICD-10-CM

## 2022-12-16 DIAGNOSIS — I10 Essential (primary) hypertension: Secondary | ICD-10-CM | POA: Diagnosis not present

## 2022-12-16 DIAGNOSIS — G4733 Obstructive sleep apnea (adult) (pediatric): Secondary | ICD-10-CM | POA: Diagnosis not present

## 2022-12-16 DIAGNOSIS — I2584 Coronary atherosclerosis due to calcified coronary lesion: Secondary | ICD-10-CM

## 2022-12-16 DIAGNOSIS — M1712 Unilateral primary osteoarthritis, left knee: Secondary | ICD-10-CM

## 2022-12-16 DIAGNOSIS — E7800 Pure hypercholesterolemia, unspecified: Secondary | ICD-10-CM

## 2022-12-16 DIAGNOSIS — Z1331 Encounter for screening for depression: Secondary | ICD-10-CM

## 2022-12-16 DIAGNOSIS — Z6841 Body Mass Index (BMI) 40.0 and over, adult: Secondary | ICD-10-CM

## 2022-12-16 DIAGNOSIS — R7989 Other specified abnormal findings of blood chemistry: Secondary | ICD-10-CM | POA: Diagnosis not present

## 2022-12-16 DIAGNOSIS — G4730 Sleep apnea, unspecified: Secondary | ICD-10-CM | POA: Insufficient documentation

## 2022-12-16 DIAGNOSIS — R5383 Other fatigue: Secondary | ICD-10-CM

## 2022-12-16 DIAGNOSIS — E559 Vitamin D deficiency, unspecified: Secondary | ICD-10-CM

## 2022-12-16 NOTE — Assessment & Plan Note (Signed)
Last A1c was 6.0.  She has had diagnosis for years and has never been on medication.

## 2022-12-16 NOTE — Assessment & Plan Note (Signed)
Blood pressure is elevated today but patient did not take her medications.  She mentions that she is taking 3 different medications to help control her blood pressure but is hopeful to get off some of her medication.  No chest pain, chest pressure, headache today.

## 2022-12-16 NOTE — Progress Notes (Addendum)
[facility] MEDICAL WEIGHT [facility] AT [place] [address] [place] [address] Dept: [phone] Dept Fax: [phone]  Chief Complaint:  Obesity   Subjective:  [name] (MR# [id number]) is a 77 y.o. female who presents for evaluation and treatment of obesity and related comorbidities. Returning patient- last seen by Dr. Manson Passey.   [name] is currently in the action stage of change and ready to dedicate time achieving and maintaining a healthier weight. [name] is interested in becoming our patient and working on intensive lifestyle modifications including (but not limited to) diet and exercise for weight loss.  [name] has been struggling with her weight. She has been unsuccessful in either losing weight, maintaining weight loss, or reaching her healthy weight goal.  She is living alone currently and her family is supportive of her. She has previously tried to lose weight here at this clinic and also with cutting back and staying away from carbs and sweets.   [name] habits were reviewed today and are as follows: she cooks, doesn't eat out more than once a week, and doesn't have many food dislikes. Tends to snack on cheese and crackers.  She skips breakfast daily due to GI side effects she was experiencing with eggs.   Food Recall: Coffee in the am with cream and sugar (2 tsp of both).  She does have the oatmilk shaken espresso from [employer].  First meal will be around 12pm and will be a sandwich or salad.  Sandwich would be salami (2 slices) and cheese and roast beef (2 slices).  Occasionally has a pickle on the side.  Feels full.  Will eat again around 6-7pm and may go to [employer] and get salad and soup- [employer] or chicken ranch (full order and eats half) and butternut squash soup or broccoli cheddar soup. Feels full from that.    Indirect Calorimeter completed today shows a RMR: 1800 . Her calculated basal metabolic rate is 4010  thus her basal metabolic rate is better than expected.  Other Fatigue [name] admits to daytime somnolence and admits to waking up still tired. Patient has a history of symptoms of daytime fatigue. [name] generally gets 6 hours of sleep per night, and states that she has many times during her day that she feels she could fall asleep. Snoring is present. Apneic episodes are present. Epworth Sleepiness Score is 7.   Shortness of Breath [name] notes increasing shortness of breath with exercising and seems to be worsening over time with weight gain. She notes getting out of breath sooner with activity than she used to. This has gotten worse recently. [name] denies shortness of breath at rest or orthopnea.  Depression Screen [name]'s Food and Mood (modified PHQ-9) score was 20.     12/12/2019    9:40 AM  Depression screen PHQ 2/9  Decreased Interest 3  Down, Depressed, Hopeless 1  PHQ - 2 Score 4  Altered sleeping 1  Tired, decreased energy 2  Change in appetite 1  Feeling bad or failure about yourself  0  Trouble concentrating 1  Moving slowly or fidgety/restless 0  Suicidal thoughts 0  PHQ-9 Score 9  Difficult doing work/chores Somewhat difficult     Objective:  Vitals Temp: 98.5 F (36.9 C) BP: (!) 145/76 Pulse Rate: 62 SpO2: 99 %   Anthropometric Measurements Height: 5\' 5"  (1.651 m) Weight: 257 lb (116.6 kg) BMI (Calculated): 42.77 Weight at Last Visit: 246 lb Weight Lost Since Last Visit: 0  Weight Gained Since Last Visit: 11 Starting Weight: 252 lb Total Weight Loss (lbs): 0 lb (0 kg) Waist Measurement : 54 inches   Body Composition  Body Fat %: 52.7 % Fat Mass (lbs): 135.8 lbs Muscle Mass (lbs): 115.6 lbs Total Body Water (lbs): 90.8 lbs Visceral Fat Rating : 20   Other Clinical Data RMR: 1800 Fasting: yes Labs: yes Today's Visit #: 7 Starting Date: 12/12/19    EKG: Patient had EKG done early October at [facility] and it showed LBBB>  General:  Cooperative, alert, well developed, in no acute distress. HEENT: Conjunctivae and lids unremarkable. Cardiovascular: Regular rhythm.  Lungs: Normal work of breathing. Neurologic: No focal deficits.   Lab Results  Component Value Date   CREATININE 0.95 09/30/2020   BUN 13 09/30/2020   NA 134 (L) 09/30/2020   K 3.8 09/30/2020   CL 105 09/30/2020   CO2 22 09/30/2020   Lab Results  Component Value Date   ALT 14 09/30/2020   AST 19 09/30/2020   ALKPHOS 52 09/30/2020   BILITOT 0.3 09/30/2020   Lab Results  Component Value Date   HGBA1C 6.0 (H) 12/12/2019   HGBA1C 6.0 (H) 04/20/2017   Lab Results  Component Value Date   INSULIN 57.4 (H) 12/12/2019   Lab Results  Component Value Date   TSH 1.380 12/12/2019   Lab Results  Component Value Date   CHOL 199 10/12/2020   HDL 39 (L) 10/12/2020   LDLCALC 138 (H) 10/12/2020   TRIG 120 10/12/2020   CHOLHDL 5.1 (H) 10/12/2020   Lab Results  Component Value Date   WBC 6.3 09/30/2020   HGB 12.6 09/30/2020   HCT 37.0 09/30/2020   MCV 95.1 09/30/2020   PLT 293 09/30/2020   No results found for: "IRON", "TIBC", "FERRITIN"  Assessment and Plan:   Other Fatigue  [name] does feel that her weight is causing her energy to be lower than it should be. Fatigue may be related to obesity, depression or many other causes. Labs will be ordered, and in the meanwhile, [name] will focus on self care including making healthy food choices, increasing physical activity and focusing on stress reduction.  Shortness of Breath  [name] does feel that she gets out of breath more easily that she used to when she exercises. 's shortness of breath appears to be obesity related and exercise induced. She has agreed to work on weight loss and gradually increase exercise to treat her exercise induced shortness of breath. Will continue to monitor closely.    Problem List Items Addressed This Visit       Cardiovascular and Mediastinum   Hypertension  (Chronic)    Blood pressure is elevated today but patient did not take her medications.  She mentions that she is taking 3 different medications to help control her blood pressure but is hopeful to get off some of her medication.  No chest pain, chest pressure, headache today.      Relevant Orders   Comprehensive metabolic panel     Respiratory   Sleep apnea    Patient has a CPAP but does not use it due to dry throat and dry mouth.  She cannot remember when her sleep study was done. Not use what machine or settings are.  Recent Echo showing diastolic dysfunction likely from untreated OSA.  Patient to look into reestablishing care with her sleep doctor.         Musculoskeletal and Integument   Unilateral primary osteoarthritis,  left knee    Needs left knee replacement but needs to get BMI closer to 40 to get surgery.  She sees Dr. Magnus Ivan for ortho.         Other   Morbid obesity (HCC)   Hyperlipidemia    Elevated LDL on prior labs.  She is taking a statin- not experiencing any side effects.  She was prescribed this years ago but stopped taking it due to concern for side effect of liver failure from her mother. She did have a coronary calcium scan done with cardiology.       Relevant Orders   Lipid Panel With LDL/HDL Ratio   Vitamin D deficiency    She is currently taking 1k international units daily.  Last lab done 3 years ago and was 12. Patient does have fatigue but denies nausea, vomiting or muscle weakness.       Relevant Orders   VITAMIN D 25 Hydroxy (Vit-D Deficiency, Fractures)   Hyperglycemia    Last A1c was 6.0.  She has had diagnosis for years and has never been on medication.        Elevated serum creatinine    Last creatinine WNL.  Previously creatinine was elevated sporadically over the last 8 years.      Relevant Orders   Hemoglobin A1c   Insulin, random   Other Visit Diagnoses     Other fatigue    -  Primary   Relevant Orders   TSH   T4, free   T3    SOBOE (shortness of breath on exertion)       Depression screening       Coronary artery disease due to calcified coronary lesion       OSA (obstructive sleep apnea)       Relevant Orders   CBC with Differential/Platelet   BMI 40.0-44.9, adult (HCC)           [name] is currently in the action stage of change and her goal is to continue with weight loss efforts. I recommend [name] begin the structured treatment plan as follows:  She has agreed to Category 3 Plan  Exercise goals: All adults should avoid inactivity. Some activity is better than none, and adults who participate in any amount of physical activity, gain some health benefits.  Behavioral modification strategies:increasing lean protein intake, increasing vegetables, and avoiding temptations  She was informed of the importance of frequent follow-up visits to maximize her success with intensive lifestyle modifications for her multiple health conditions. She was informed we would discuss her lab results at her next visit unless there is a critical issue that needs to be addressed sooner. [name] agreed to keep her next visit at the agreed upon time to discuss these results.   Attestation Statements:  Reviewed by clinician on day of visit: allergies, medications, problem list, medical history, surgical history, family history, social history, and previous encounter notes. This is the patient's first visit at [facility]. The patient's NEW PATIENT PACKET was reviewed at length. Included in the packet: current and past health history, medications, allergies, ROS, gynecologic history (women only), surgical history, family history, social history, weight history, weight loss surgery history (for those that have had weight loss surgery), nutritional evaluation, mood and food questionnaire, PHQ9, Epworth questionnaire, sleep habits questionnaire, patient life and health improvement goals questionnaire. These will all be scanned  into the patient's chart under media.   During the visit, I independently reviewed the patient's EKG, bioimpedance  scale results, and indirect calorimeter results. I used this information to tailor a meal plan for the patient that will help her to lose weight and will improve her obesity-related conditions going forward. I performed a medically necessary appropriate examination and/or evaluation. I discussed the assessment and treatment plan with the patient. The patient was provided an opportunity to ask questions and all were answered. The patient agreed with the plan and demonstrated an understanding of the instructions. Labs were ordered at this visit and will be reviewed at the next visit unless more critical results need to be addressed immediately. Clinical information was updated and documented in the EMR.    Time spent on visit including pre-visit chart review and post-visit charting and care was 45 minutes.   [name], MD

## 2022-12-16 NOTE — Assessment & Plan Note (Signed)
She is currently taking 1k international units daily.  Last lab done 3 years ago and was 12. Patient does have fatigue but denies nausea, vomiting or muscle weakness.

## 2022-12-16 NOTE — Assessment & Plan Note (Signed)
Last creatinine WNL.  Previously creatinine was elevated sporadically over the last 8 years.

## 2022-12-16 NOTE — Assessment & Plan Note (Signed)
Needs left knee replacement but needs to get BMI closer to 40 to get surgery.  She sees Dr. Magnus Ivan for ortho.

## 2022-12-16 NOTE — Assessment & Plan Note (Signed)
Elevated LDL on prior labs.  She is taking a statin- not experiencing any side effects.  She was prescribed this years ago but stopped taking it due to concern for side effect of liver failure from her mother. She did have a coronary calcium scan done with cardiology.

## 2022-12-16 NOTE — Assessment & Plan Note (Addendum)
Patient has a CPAP but does not use it due to dry throat and dry mouth.  She cannot remember when her sleep study was done. Not use what machine or settings are.  Recent Echo showing diastolic dysfunction likely from untreated OSA.  Patient to look into reestablishing care with her sleep doctor.

## 2022-12-17 LAB — CBC WITH DIFFERENTIAL/PLATELET
Basophils Absolute: 0.1 10*3/uL (ref 0.0–0.2)
Basos: 2 %
EOS (ABSOLUTE): 0.1 10*3/uL (ref 0.0–0.4)
Eos: 2 %
Hematocrit: 41.5 % (ref 34.0–46.6)
Hemoglobin: 13.6 g/dL (ref 11.1–15.9)
Immature Grans (Abs): 0 10*3/uL (ref 0.0–0.1)
Immature Granulocytes: 0 %
Lymphocytes Absolute: 1.7 10*3/uL (ref 0.7–3.1)
Lymphs: 33 %
MCH: 31.6 pg (ref 26.6–33.0)
MCHC: 32.8 g/dL (ref 31.5–35.7)
MCV: 97 fL (ref 79–97)
Monocytes Absolute: 0.4 10*3/uL (ref 0.1–0.9)
Monocytes: 7 %
Neutrophils Absolute: 2.9 10*3/uL (ref 1.4–7.0)
Neutrophils: 56 %
Platelets: 337 10*3/uL (ref 150–450)
RBC: 4.3 x10E6/uL (ref 3.77–5.28)
RDW: 13 % (ref 11.7–15.4)
WBC: 5.2 10*3/uL (ref 3.4–10.8)

## 2022-12-17 LAB — HEMOGLOBIN A1C
Est. average glucose Bld gHb Est-mCnc: 126 mg/dL
Hgb A1c MFr Bld: 6 % — ABNORMAL HIGH (ref 4.8–5.6)

## 2022-12-17 LAB — COMPREHENSIVE METABOLIC PANEL
ALT: 13 [IU]/L (ref 0–32)
AST: 15 [IU]/L (ref 0–40)
Albumin: 4.1 g/dL (ref 3.8–4.8)
Alkaline Phosphatase: 79 [IU]/L (ref 44–121)
BUN/Creatinine Ratio: 13 (ref 12–28)
BUN: 11 mg/dL (ref 8–27)
Bilirubin Total: 0.4 mg/dL (ref 0.0–1.2)
CO2: 20 mmol/L (ref 20–29)
Calcium: 9.3 mg/dL (ref 8.7–10.3)
Chloride: 103 mmol/L (ref 96–106)
Creatinine, Ser: 0.88 mg/dL (ref 0.57–1.00)
Globulin, Total: 2.7 g/dL (ref 1.5–4.5)
Glucose: 102 mg/dL — ABNORMAL HIGH (ref 70–99)
Potassium: 4.3 mmol/L (ref 3.5–5.2)
Sodium: 140 mmol/L (ref 134–144)
Total Protein: 6.8 g/dL (ref 6.0–8.5)
eGFR: 68 mL/min/{1.73_m2} (ref >59)

## 2022-12-17 LAB — LIPID PANEL WITH LDL/HDL RATIO
Cholesterol, Total: 197 mg/dL (ref 100–199)
HDL: 42 mg/dL (ref >39)
LDL Chol Calc (NIH): 139 mg/dL — ABNORMAL HIGH (ref 0–99)
LDL/HDL Ratio: 3.3 ratio — ABNORMAL HIGH (ref 0.0–3.2)
Triglycerides: 86 mg/dL (ref 0–149)
VLDL Cholesterol Cal: 16 mg/dL (ref 5–40)

## 2022-12-17 LAB — VITAMIN D 25 HYDROXY (VIT D DEFICIENCY, FRACTURES): Vit D, 25-Hydroxy: 23.9 ng/mL — ABNORMAL LOW (ref 30.0–100.0)

## 2022-12-17 LAB — T3: T3, Total: 117 ng/dL (ref 71–180)

## 2022-12-17 LAB — TSH: TSH: 1.4 u[IU]/mL (ref 0.450–4.500)

## 2022-12-17 LAB — T4, FREE: Free T4: 1.11 ng/dL (ref 0.82–1.77)

## 2022-12-17 LAB — INSULIN, RANDOM: INSULIN: 51 u[IU]/mL — ABNORMAL HIGH (ref 2.6–24.9)

## 2022-12-24 ENCOUNTER — Ambulatory Visit (INDEPENDENT_AMBULATORY_CARE_PROVIDER_SITE_OTHER): Payer: Medicare Other | Admitting: Family Medicine

## 2023-01-06 ENCOUNTER — Encounter (INDEPENDENT_AMBULATORY_CARE_PROVIDER_SITE_OTHER): Payer: Self-pay | Admitting: Family Medicine

## 2023-01-06 ENCOUNTER — Ambulatory Visit (INDEPENDENT_AMBULATORY_CARE_PROVIDER_SITE_OTHER): Payer: Medicare Other | Admitting: Family Medicine

## 2023-01-06 DIAGNOSIS — I10 Essential (primary) hypertension: Secondary | ICD-10-CM | POA: Diagnosis not present

## 2023-01-06 DIAGNOSIS — E7800 Pure hypercholesterolemia, unspecified: Secondary | ICD-10-CM

## 2023-01-06 DIAGNOSIS — R7303 Prediabetes: Secondary | ICD-10-CM | POA: Diagnosis not present

## 2023-01-06 DIAGNOSIS — Z6841 Body Mass Index (BMI) 40.0 and over, adult: Secondary | ICD-10-CM

## 2023-01-06 DIAGNOSIS — E559 Vitamin D deficiency, unspecified: Secondary | ICD-10-CM | POA: Diagnosis not present

## 2023-01-06 DIAGNOSIS — E785 Hyperlipidemia, unspecified: Secondary | ICD-10-CM

## 2023-01-06 MED ORDER — VITAMIN D (ERGOCALCIFEROL) 1.25 MG (50000 UNIT) PO CAPS: 50000.0000 [IU] | ORAL_CAPSULE | ORAL | 0 refills | Status: DC

## 2023-01-06 NOTE — Assessment & Plan Note (Addendum)
The 10-year ASCVD risk score (Arnett DK, et al., 2019) is: 15%   Values used to calculate the score:     Age: 77 years     Sex: Female     Is Non-Hispanic African American: Yes     Diabetic: No     Tobacco smoker: No     Systolic Blood Pressure: 134 mmHg     Is BP treated: Yes     HDL Cholesterol: 42 mg/dL     Total Cholesterol: 197 mg/dL Patient to continue current statin dose for next 3 months and then will repeat FLP.  If still LDL elevated will discuss increase of statin.

## 2023-01-06 NOTE — Assessment & Plan Note (Addendum)
On norvasc, coreg and zestril.  BP was slightly elevated today on initial reading.  Repeat reading better controlled.  No chest pain, chest pressure or headache. Follow up on BP at next appointment.  No change in medication or dosage today.

## 2023-01-06 NOTE — Assessment & Plan Note (Signed)
 Discussed importance of vitamin d supplementation.  Vitamin d supplementation has been shown to decrease fatigue, decrease risk of progression to insulin resistance and then prediabetes, decreases risk of falling in older age and can even assist in decreasing depressive symptoms in PTSD.   Prescription for Vitamin D sent in.

## 2023-01-06 NOTE — Assessment & Plan Note (Signed)

## 2023-01-06 NOTE — Progress Notes (Signed)
SUBJECTIVE:  Chief Complaint: Obesity  Interim History: Patient voices that her grandson is dealing with some issues and is calling her and talking with her for sometimes a couple of hours at a time.  She does recognize with the length of the phone calls she has not been as able to get all the food in.  She also voices she needs to go food shopping in order to get the food for the plan in.  She mentions she can go food shopping today.  She is invited to three places for the Thanksgiving holiday. No significant plans for the month of December.   [name] is here to discuss her progress with her obesity treatment plan. She is on the Category 3 Plan and states she is following her eating plan approximately 50 % of the time. She states she is walking a lot.    OBJECTIVE: Visit Diagnoses: Problem List Items Addressed This Visit       Cardiovascular and Mediastinum   Hypertension (Chronic)    On norvasc, coreg and zestril.  BP was slightly elevated today on initial reading.  Repeat reading better controlled.  No chest pain, chest pressure or headache. Follow up on BP at next appointment.  No change in medication or dosage today.        Other   Morbid obesity (HCC) - Primary   Hyperlipidemia    The 10-year ASCVD risk score (Arnett DK, et al., 2019) is: 15%   Values used to calculate the score:     Age: 77 years     Sex: Female     Is Non-Hispanic African American: Yes     Diabetic: No     Tobacco smoker: No     Systolic Blood Pressure: 134 mmHg     Is BP treated: Yes     HDL Cholesterol: 42 mg/dL     Total Cholesterol: 197 mg/dL Patient to continue current statin dose for next 3 months and then will repeat FLP.  If still LDL elevated will discuss increase of statin.      Prediabetes    Pathophysiology of progression through insulin resistance to prediabetes and diabetes was discussed at length today.  Patient to continue to monitor and be in control of total intake of snack calories  which may be simple carbohydrates but should be consumed only after the patient has taken in all the nutrition for the day.  Macronutrient identification, classification and daily intake ratios were discussed.  Plan to repeat labs in 3 months to monitor both hemoglobin A1c and insulin levels.  No medications at this time as patient is not having significant hunger or cravings that would make following meal plan more difficult.         Vitamin D deficiency    Discussed importance of vitamin d supplementation.  Vitamin d supplementation has been shown to decrease fatigue, decrease risk of progression to insulin resistance and then prediabetes, decreases risk of falling in older age and can even assist in decreasing depressive symptoms in PTSD.   Prescription for Vitamin D sent in.        Relevant Medications   Vitamin D, Ergocalciferol, (DRISDOL) 1.25 MG (50000 UNIT) CAPS capsule    Vitals Temp: 98.7 F (37.1 C) BP: 134/68 Pulse Rate: 67 SpO2: 97 %   Anthropometric Measurements Height: 5\' 5"  (1.651 m) Weight: 257 lb (116.6 kg) BMI (Calculated): 42.77 Weight at Last Visit: 257 lb Weight Lost Since Last Visit: 0 Weight Gained Since  Last Visit: 0 Starting Weight: 252 lb Total Weight Loss (lbs): 0 lb (0 kg)   Body Composition  Body Fat %: 53 % Fat Mass (lbs): 136.2 lbs Muscle Mass (lbs): 114.6 lbs Total Body Water (lbs): 92.4 lbs Visceral Fat Rating : 20   Other Clinical Data Today's Visit #: 8 Starting Date: 12/12/19     ASSESSMENT AND PLAN:  Diet: [name] is currently in the action stage of change. As such, her goal is to continue with weight loss efforts. She has agreed to Category 3 Plan.  Exercise: [name] has been instructed to try a geriatric exercise plan for weight loss and overall health benefits.   Behavior Modification:  We discussed the following Behavioral Modification Strategies today: increasing lean protein intake, increasing vegetables, no skipping  meals, and meal planning and cooking strategies.   No follow-ups on file.Marland Kitchen She was informed of the importance of frequent follow up visits to maximize her success with intensive lifestyle modifications for her multiple health conditions.  Attestation Statements:   Reviewed by clinician on day of visit: allergies, medications, problem list, medical history, surgical history, family history, social history, and previous encounter notes.   Time spent on visit including pre-visit chart review and post-visit care and charting was 45 minutes.    [name], MD

## 2023-01-08 ENCOUNTER — Emergency Department (HOSPITAL_BASED_OUTPATIENT_CLINIC_OR_DEPARTMENT_OTHER): Payer: Medicare Other | Admitting: Radiology

## 2023-01-08 ENCOUNTER — Inpatient Hospital Stay (HOSPITAL_BASED_OUTPATIENT_CLINIC_OR_DEPARTMENT_OTHER)
Admission: EM | Admit: 2023-01-08 | Discharge: 2023-01-12 | DRG: 176 | Disposition: A | Discharge status: Home Health | Payer: Medicare Other | Attending: Internal Medicine | Admitting: Internal Medicine

## 2023-01-08 ENCOUNTER — Emergency Department (HOSPITAL_BASED_OUTPATIENT_CLINIC_OR_DEPARTMENT_OTHER): Payer: Medicare Other

## 2023-01-08 ENCOUNTER — Encounter (HOSPITAL_BASED_OUTPATIENT_CLINIC_OR_DEPARTMENT_OTHER): Payer: Self-pay

## 2023-01-08 DIAGNOSIS — M79662 Pain in left lower leg: Secondary | ICD-10-CM | POA: Diagnosis not present

## 2023-01-08 DIAGNOSIS — Z841 Family history of disorders of kidney and ureter: Secondary | ICD-10-CM | POA: Diagnosis not present

## 2023-01-08 DIAGNOSIS — E785 Hyperlipidemia, unspecified: Secondary | ICD-10-CM | POA: Diagnosis present

## 2023-01-08 DIAGNOSIS — Z96641 Presence of right artificial hip joint: Secondary | ICD-10-CM | POA: Diagnosis present

## 2023-01-08 DIAGNOSIS — I2510 Atherosclerotic heart disease of native coronary artery without angina pectoris: Secondary | ICD-10-CM | POA: Diagnosis present

## 2023-01-08 DIAGNOSIS — I10 Essential (primary) hypertension: Secondary | ICD-10-CM | POA: Diagnosis not present

## 2023-01-08 DIAGNOSIS — Z823 Family history of stroke: Secondary | ICD-10-CM

## 2023-01-08 DIAGNOSIS — Z818 Family history of other mental and behavioral disorders: Secondary | ICD-10-CM

## 2023-01-08 DIAGNOSIS — J90 Pleural effusion, not elsewhere classified: Secondary | ICD-10-CM | POA: Diagnosis not present

## 2023-01-08 DIAGNOSIS — R7303 Prediabetes: Secondary | ICD-10-CM | POA: Diagnosis present

## 2023-01-08 DIAGNOSIS — Z79899 Other long term (current) drug therapy: Secondary | ICD-10-CM

## 2023-01-08 DIAGNOSIS — I2602 Saddle embolus of pulmonary artery with acute cor pulmonale: Secondary | ICD-10-CM | POA: Diagnosis not present

## 2023-01-08 DIAGNOSIS — Z87891 Personal history of nicotine dependence: Secondary | ICD-10-CM

## 2023-01-08 DIAGNOSIS — R0603 Acute respiratory distress: Secondary | ICD-10-CM | POA: Diagnosis present

## 2023-01-08 DIAGNOSIS — J9811 Atelectasis: Secondary | ICD-10-CM | POA: Diagnosis not present

## 2023-01-08 DIAGNOSIS — S29012A Strain of muscle and tendon of back wall of thorax, initial encounter: Secondary | ICD-10-CM | POA: Diagnosis not present

## 2023-01-08 DIAGNOSIS — R079 Chest pain, unspecified: Principal | ICD-10-CM | POA: Diagnosis present

## 2023-01-08 DIAGNOSIS — Z86718 Personal history of other venous thrombosis and embolism: Secondary | ICD-10-CM

## 2023-01-08 DIAGNOSIS — E875 Hyperkalemia: Secondary | ICD-10-CM | POA: Diagnosis present

## 2023-01-08 DIAGNOSIS — Z833 Family history of diabetes mellitus: Secondary | ICD-10-CM

## 2023-01-08 DIAGNOSIS — Z83438 Family history of other disorder of lipoprotein metabolism and other lipidemia: Secondary | ICD-10-CM

## 2023-01-08 DIAGNOSIS — Z811 Family history of alcohol abuse and dependence: Secondary | ICD-10-CM

## 2023-01-08 DIAGNOSIS — I2699 Other pulmonary embolism without acute cor pulmonale: Principal | ICD-10-CM | POA: Diagnosis present

## 2023-01-08 DIAGNOSIS — F419 Anxiety disorder, unspecified: Secondary | ICD-10-CM | POA: Diagnosis present

## 2023-01-08 DIAGNOSIS — N179 Acute kidney failure, unspecified: Secondary | ICD-10-CM | POA: Diagnosis not present

## 2023-01-08 DIAGNOSIS — Z8249 Family history of ischemic heart disease and other diseases of the circulatory system: Secondary | ICD-10-CM | POA: Diagnosis not present

## 2023-01-08 DIAGNOSIS — R0902 Hypoxemia: Secondary | ICD-10-CM | POA: Diagnosis present

## 2023-01-08 DIAGNOSIS — M7989 Other specified soft tissue disorders: Secondary | ICD-10-CM | POA: Diagnosis not present

## 2023-01-08 DIAGNOSIS — Z8042 Family history of malignant neoplasm of prostate: Secondary | ICD-10-CM

## 2023-01-08 DIAGNOSIS — G8929 Other chronic pain: Secondary | ICD-10-CM | POA: Diagnosis present

## 2023-01-08 DIAGNOSIS — J439 Emphysema, unspecified: Secondary | ICD-10-CM | POA: Diagnosis not present

## 2023-01-08 DIAGNOSIS — R059 Cough, unspecified: Secondary | ICD-10-CM | POA: Diagnosis not present

## 2023-01-08 DIAGNOSIS — Z1152 Encounter for screening for COVID-19: Secondary | ICD-10-CM

## 2023-01-08 DIAGNOSIS — Z6372 Alcoholism and drug addiction in family: Secondary | ICD-10-CM

## 2023-01-08 DIAGNOSIS — R0789 Other chest pain: Secondary | ICD-10-CM | POA: Diagnosis not present

## 2023-01-08 DIAGNOSIS — K219 Gastro-esophageal reflux disease without esophagitis: Secondary | ICD-10-CM | POA: Diagnosis present

## 2023-01-08 DIAGNOSIS — Z743 Need for continuous supervision: Secondary | ICD-10-CM | POA: Diagnosis not present

## 2023-01-08 DIAGNOSIS — R0600 Dyspnea, unspecified: Secondary | ICD-10-CM | POA: Diagnosis present

## 2023-01-08 LAB — CBC
HCT: 42.8 % (ref 36.0–46.0)
Hemoglobin: 14.8 g/dL (ref 12.0–15.0)
MCH: 32.2 pg (ref 26.0–34.0)
MCHC: 34.6 g/dL (ref 30.0–36.0)
MCV: 93.2 fL (ref 80.0–100.0)
Platelets: 230 10*3/uL (ref 150–400)
RBC: 4.59 MIL/uL (ref 3.87–5.11)
RDW: 13.6 % (ref 11.5–15.5)
WBC: 10.4 10*3/uL (ref 4.0–10.5)
nRBC: 0 % (ref 0.0–0.2)

## 2023-01-08 LAB — BASIC METABOLIC PANEL
Anion gap: 8 (ref 5–15)
BUN: 14 mg/dL (ref 8–23)
CO2: 26 mmol/L (ref 22–32)
Calcium: 9.7 mg/dL (ref 8.9–10.3)
Chloride: 100 mmol/L (ref 98–111)
Creatinine, Ser: 0.94 mg/dL (ref 0.44–1.00)
GFR, Estimated: >60 mL/min (ref >60)
Glucose, Bld: 118 mg/dL — ABNORMAL HIGH (ref 70–99)
Potassium: 5.4 mmol/L — ABNORMAL HIGH (ref 3.5–5.1)
Sodium: 134 mmol/L — ABNORMAL LOW (ref 135–145)

## 2023-01-08 LAB — D-DIMER, QUANTITATIVE: D-Dimer, Quant: 10.62 ug{FEU}/mL — ABNORMAL HIGH (ref 0.00–0.50)

## 2023-01-08 LAB — TROPONIN I (HIGH SENSITIVITY): Troponin I (High Sensitivity): 5 ng/L (ref <18)

## 2023-01-08 MED ORDER — HEPARIN BOLUS VIA INFUSION
4000.0000 [IU] | Freq: Once | INTRAVENOUS | Status: AC
  Administered 2023-01-08: 4000 [IU] via INTRAVENOUS

## 2023-01-08 MED ORDER — HEPARIN (PORCINE) 25000 UT/250ML-% IV SOLN
1450.0000 [IU]/h | INTRAVENOUS | Status: DC
  Administered 2023-01-08: 1400 [IU]/h via INTRAVENOUS
  Administered 2023-01-09 – 2023-01-10 (×3): 1450 [IU]/h via INTRAVENOUS
  Filled 2023-01-08 (×4): qty 250

## 2023-01-08 MED ORDER — IOHEXOL 350 MG/ML SOLN
100.0000 mL | Freq: Once | INTRAVENOUS | Status: AC | PRN
  Administered 2023-01-08: 100 mL via INTRAVENOUS

## 2023-01-08 MED ORDER — OXYCODONE-ACETAMINOPHEN 5-325 MG PO TABS
1.0000 | ORAL_TABLET | Freq: Once | ORAL | Status: AC
  Administered 2023-01-08: 1 via ORAL
  Filled 2023-01-08: qty 1

## 2023-01-08 NOTE — Progress Notes (Signed)
PHARMACY - ANTICOAGULATION CONSULT NOTE  Pharmacy Consult for Heparin Indication: pulmonary embolus  Allergies  Allergen Reactions   Kiwi Extract Itching   Pineapple Itching    Patient Measurements:   Heparin Dosing Weight: 85 kg  Vital Signs: Temp: 98.2 F (36.8 C) (11/28 2159) BP: 104/78 (11/28 2245) Pulse Rate: 81 (11/28 2245)  Labs: Recent Labs    01/08/23 2211  HGB 14.8  HCT 42.8  PLT 230  CREATININE 0.94  TROPONINIHS 5    Estimated Creatinine Clearance: 64.9 mL/min (by C-G formula based on SCr of 0.94 mg/dL).   Medical History: Past Medical History:  Diagnosis Date   Anxiety    Arthritis    Back pain    Chronic pain    Edema of both lower extremities    Fluid retention    Food allergy    GERD (gastroesophageal reflux disease)    Hyperlipidemia    Hypertension    Knee pain    LBBB (left bundle branch block)    Osteoarthritis    Pre-diabetes    Shoulder pain    Sleep apnea    possibly   Spinal stenosis    Vitamin D deficiency     Medications:  No current facility-administered medications on file prior to encounter.   Current Outpatient Medications on File Prior to Encounter  Medication Sig Dispense Refill   acetaminophen (TYLENOL) 650 MG CR tablet Take 650 mg by mouth every 8 (eight) hours as needed for pain.     amLODipine (NORVASC) 5 MG tablet Take 5 mg by mouth every morning.     Ascorbic Acid (VITAMIN C) 1000 MG tablet Take 1,000 mg by mouth daily.     carvedilol (COREG) 12.5 MG tablet Take 1 tablet (12.5 mg total) by mouth 2 (two) times daily with a meal. 60 tablet 11   cholecalciferol (VITAMIN D) 25 MCG (1000 UNIT) tablet Take 1,000 Units by mouth daily.     furosemide (LASIX) 20 MG tablet Take 20 mg by mouth 2 (two) times daily.     lisinopril (ZESTRIL) 20 MG tablet Take 40 mg by mouth daily.     Multiple Vitamins-Minerals (CENTRUM SILVER PO) Take by mouth.     rosuvastatin (CRESTOR) 20 MG tablet Take 1 tablet (20 mg total) by mouth  daily. 90 tablet 3   Vitamin D, Ergocalciferol, (DRISDOL) 1.25 MG (50000 UNIT) CAPS capsule Take 1 capsule (50,000 Units total) by mouth every 7 (seven) days. 4 capsule 0     Assessment: 77 y.o. female with PE for heparin Goal of Therapy:  Heparin level 0.3-0.7 units/ml Monitor platelets by anticoagulation protocol: Yes   Plan:  Heparin 4000 units IV bolus, then start heparin 1400 units/hr Check heparin level in 8 hours.   Huel Centola, Gary Fleet 01/08/2023,11:32 PM

## 2023-01-08 NOTE — ED Provider Notes (Signed)
[place] EMERGENCY DEPARTMENT AT [facility] Provider Note   CSN: [id number] Arrival date & time: 01/08/23  2153     History  Chief Complaint  Patient presents with   Chest Pain   Cough    [name] is a 77 y.o. female.  77 yo F with a chief complaint of cough and difficulty breathing and chest pain.  This has been going on for a few days now.  She noticed that there is some blood streaked sputum and had developed some left-sided chest discomfort.  Denies trauma to the area.  No fevers.  No known sick contacts.  Patient thinks that she has had a DVT in the past.   Chest Pain Associated symptoms: cough   Cough Associated symptoms: chest pain        Home Medications Prior to Admission medications   Medication Sig Start Date End Date Taking? Authorizing Provider  acetaminophen (TYLENOL) 650 MG CR tablet Take 650 mg by mouth every 8 (eight) hours as needed for pain.    [provider]  amLODipine (NORVASC) 5 MG tablet Take 5 mg by mouth every morning.    [provider]  Ascorbic Acid (VITAMIN C) 1000 MG tablet Take 1,000 mg by mouth daily.    [provider]  carvedilol (COREG) 12.5 MG tablet Take 1 tablet (12.5 mg total) by mouth 2 (two) times daily with a meal. 06/18/14   Barrett, Joline Salt, PA-C  cholecalciferol (VITAMIN D) 25 MCG (1000 UNIT) tablet Take 1,000 Units by mouth daily.    [provider]  furosemide (LASIX) 20 MG tablet Take 20 mg by mouth 2 (two) times daily.    [provider]  lisinopril (ZESTRIL) 20 MG tablet Take 40 mg by mouth daily. 09/13/20   [provider]  Multiple Vitamins-Minerals (CENTRUM SILVER PO) Take by mouth.    [provider]  rosuvastatin (CRESTOR) 20 MG tablet Take 1 tablet (20 mg total) by mouth daily. 11/11/22   Alver Sorrow, NP  Vitamin D, Ergocalciferol, (DRISDOL) 1.25 MG (50000 UNIT) CAPS capsule Take 1 capsule (50,000 Units total) by mouth every 7 (seven)  days. 01/06/23   Langston Reusing, MD      Allergies    Kiwi extract and Pineapple    Review of Systems   Review of Systems  Respiratory:  Positive for cough.   Cardiovascular:  Positive for chest pain.    Physical Exam Updated Vital Signs BP 104/78   Pulse 81   Temp 98.2 F (36.8 C)   Resp (!) 21   SpO2 92%  Physical Exam Vitals and nursing note reviewed.  Constitutional:      General: She is not in acute distress.    Appearance: She is well-developed. She is not diaphoretic.  HENT:     Head: Normocephalic and atraumatic.  Eyes:     Pupils: Pupils are equal, round, and reactive to light.  Cardiovascular:     Rate and Rhythm: Normal rate and regular rhythm.     Heart sounds: No murmur heard.    No friction rub. No gallop.  Pulmonary:     Effort: Pulmonary effort is normal.     Breath sounds: No wheezing or rales.  Chest:     Chest wall: Tenderness present.     Comments: Pain over the left anterior chest reproduces her discomfort. Abdominal:     General: There is no distension.     Palpations: Abdomen is soft.  Tenderness: There is no abdominal tenderness.  Musculoskeletal:        General: No tenderness.     Cervical back: Normal range of motion and neck supple.  Skin:    General: Skin is warm and dry.  Neurological:     Mental Status: She is alert and oriented to person, place, and time.  Psychiatric:        Behavior: Behavior normal.     ED Results / Procedures / Treatments   Labs (all labs ordered are listed, but only abnormal results are displayed) Labs Reviewed  BASIC METABOLIC PANEL - Abnormal; Notable for the following components:      Result Value   Sodium 134 (*)    Potassium 5.4 (*)    Glucose, Bld 118 (*)    All other components within normal limits  D-DIMER, QUANTITATIVE - Abnormal; Notable for the following components:   D-Dimer, Quant 10.62 (*)    All other components within normal limits  CBC  TROPONIN I (HIGH SENSITIVITY)     EKG EKG Interpretation Date/Time:  Thursday January 08 2023 22:06:53 EST Ventricular Rate:  90 PR Interval:  137 QRS Duration:  135 QT Interval:  383 QTC Calculation: 469 R Axis:   -2  Text Interpretation: Sinus rhythm Left bundle branch block No significant change since last tracing Confirmed by Melene Plan [id number]) on 01/08/2023 10:32:40 PM  Radiology DG Chest 2 View  Result Date: 01/08/2023 CLINICAL DATA:  chest pain cough EXAM: CHEST - 2 VIEW COMPARISON:  Chest x-ray 09/30/2020, chest x-ray 04/20/2017 FINDINGS: The heart and mediastinal contours are within normal limits. No focal consolidation. Chronic coarsened interstitial markings with no overt pulmonary edema. No pleural effusion. No pneumothorax. No acute osseous abnormality. IMPRESSION: No active cardiopulmonary disease. Electronically Signed   By: Tish Frederickson M.D.   On: 01/08/2023 22:35    Procedures Procedures    Medications Ordered in ED Medications  iohexol (OMNIPAQUE) 350 MG/ML injection 100 mL (100 mLs Intravenous Contrast Given 01/08/23 2304)    ED Course/ Medical Decision Making/ A&P                                 Medical Decision Making Amount and/or Complexity of Data Reviewed Labs: ordered. Radiology: ordered.  Risk Prescription drug management.   77 yo F with a chief complaint of left-sided chest discomfort cough and blood-streaked sputum.  I think is less likely the patient has a PE though she told me she has a history of DVT in the past has an oxygen level in the low 90s and is mildly tachycardic.  On my record review I see no history of DVT.  Her pain is reproduced on exam.  Much more likely diagnosis is viral illness with a cough with subsequent chest wall pain.  Will obtain a D-dimer.  DDimer elevated.  Will obtain CT imaging.    Potassium is mildly elevated.  No anemia no leukocytosis.  Troponins negative.  Pain greater than 48 hours without significant change do not feel that delta  would be warranted.  Patient care was signed out to Dr. Wilkie Aye, please see their note for further details care in the ED.  The patients results and plan were reviewed and discussed.   Any x-rays performed were independently reviewed by myself.   Differential diagnosis were considered with the presenting HPI.  Medications  iohexol (OMNIPAQUE) 350 MG/ML injection 100 mL (100 mLs Intravenous  Contrast Given 01/08/23 2304)    Vitals:   01/08/23 2159 01/08/23 2230 01/08/23 2245  BP: (!) 189/90 (!) 149/74 104/78  Pulse: (!) 104 89 81  Resp: 17 (!) 21 (!) 21  Temp: 98.2 F (36.8 C)    SpO2: 93% 93% 92%    Final diagnoses:  Left-sided chest pain    Admission/ observation were discussed with the admitting physician, patient and/or family and they are comfortable with the plan.          Final Clinical Impression(s) / ED Diagnoses Final diagnoses:  Left-sided chest pain    Rx / DC Orders ED Discharge Orders     None         Melene Plan, DO 01/08/23 2307

## 2023-01-08 NOTE — ED Triage Notes (Addendum)
Productice cough x "a couple days," earlier today endorsed CP, SHOB, CP worsening w inspiration, "some nausea but no vomiting." Denies known sick contact.   Hx LBBB, compliant w home meds

## 2023-01-08 NOTE — ED Provider Notes (Signed)
Patient signed out pending CT scan.  CT scan positive for pulmonary embolus.  Heparin was initiated.  Patient updated.  Borderline O2 sats 91% on my repeat evaluation.  She is clinically stable.  No signs of right heart strain.  Will consult with the hospitalist for admission.  Physical Exam  BP 104/78   Pulse 81   Temp 98.2 F (36.8 C)   Resp (!) 21   SpO2 92%   Physical Exam Awake, alert, no acute distress Procedures  Procedures  ED Course / MDM    Medical Decision Making Amount and/or Complexity of Data Reviewed Labs: ordered. Radiology: ordered.  Risk Prescription drug management. Decision regarding hospitalization.   Problem List Items Addressed This Visit   None Visit Diagnoses     Left-sided chest pain    -  Primary   Acute pulmonary embolism without acute cor pulmonale, unspecified pulmonary embolism type (HCC)       Relevant Medications   heparin bolus via infusion 4,000 Units (Start on 01/08/2023 11:45 PM)   heparin ADULT infusion 100 units/mL (25000 units/280mL) (Start on 01/08/2023 11:45 PM)             Shon Baton, MD 01/08/23 2338

## 2023-01-08 NOTE — ED Notes (Signed)
ED Provider at bedside. 

## 2023-01-09 ENCOUNTER — Inpatient Hospital Stay (HOSPITAL_COMMUNITY): Payer: Medicare Other

## 2023-01-09 ENCOUNTER — Other Ambulatory Visit: Payer: Self-pay

## 2023-01-09 DIAGNOSIS — E785 Hyperlipidemia, unspecified: Secondary | ICD-10-CM | POA: Diagnosis present

## 2023-01-09 DIAGNOSIS — R079 Chest pain, unspecified: Secondary | ICD-10-CM | POA: Diagnosis present

## 2023-01-09 DIAGNOSIS — I2510 Atherosclerotic heart disease of native coronary artery without angina pectoris: Secondary | ICD-10-CM | POA: Diagnosis present

## 2023-01-09 DIAGNOSIS — Z96641 Presence of right artificial hip joint: Secondary | ICD-10-CM | POA: Diagnosis present

## 2023-01-09 DIAGNOSIS — Z833 Family history of diabetes mellitus: Secondary | ICD-10-CM | POA: Diagnosis not present

## 2023-01-09 DIAGNOSIS — Z8249 Family history of ischemic heart disease and other diseases of the circulatory system: Secondary | ICD-10-CM | POA: Diagnosis not present

## 2023-01-09 DIAGNOSIS — Z79899 Other long term (current) drug therapy: Secondary | ICD-10-CM | POA: Diagnosis not present

## 2023-01-09 DIAGNOSIS — Z6372 Alcoholism and drug addiction in family: Secondary | ICD-10-CM | POA: Diagnosis not present

## 2023-01-09 DIAGNOSIS — S29012A Strain of muscle and tendon of back wall of thorax, initial encounter: Secondary | ICD-10-CM | POA: Diagnosis present

## 2023-01-09 DIAGNOSIS — Z8042 Family history of malignant neoplasm of prostate: Secondary | ICD-10-CM | POA: Diagnosis not present

## 2023-01-09 DIAGNOSIS — E875 Hyperkalemia: Secondary | ICD-10-CM | POA: Diagnosis present

## 2023-01-09 DIAGNOSIS — Z83438 Family history of other disorder of lipoprotein metabolism and other lipidemia: Secondary | ICD-10-CM | POA: Diagnosis not present

## 2023-01-09 DIAGNOSIS — Z811 Family history of alcohol abuse and dependence: Secondary | ICD-10-CM | POA: Diagnosis not present

## 2023-01-09 DIAGNOSIS — I2602 Saddle embolus of pulmonary artery with acute cor pulmonale: Secondary | ICD-10-CM | POA: Diagnosis not present

## 2023-01-09 DIAGNOSIS — I10 Essential (primary) hypertension: Secondary | ICD-10-CM | POA: Diagnosis present

## 2023-01-09 DIAGNOSIS — Z818 Family history of other mental and behavioral disorders: Secondary | ICD-10-CM | POA: Diagnosis not present

## 2023-01-09 DIAGNOSIS — I2699 Other pulmonary embolism without acute cor pulmonale: Secondary | ICD-10-CM | POA: Diagnosis not present

## 2023-01-09 DIAGNOSIS — R0902 Hypoxemia: Secondary | ICD-10-CM | POA: Diagnosis present

## 2023-01-09 DIAGNOSIS — Z86718 Personal history of other venous thrombosis and embolism: Secondary | ICD-10-CM | POA: Diagnosis not present

## 2023-01-09 DIAGNOSIS — R0600 Dyspnea, unspecified: Secondary | ICD-10-CM | POA: Diagnosis present

## 2023-01-09 DIAGNOSIS — Z841 Family history of disorders of kidney and ureter: Secondary | ICD-10-CM | POA: Diagnosis not present

## 2023-01-09 DIAGNOSIS — M79662 Pain in left lower leg: Secondary | ICD-10-CM | POA: Diagnosis not present

## 2023-01-09 DIAGNOSIS — M7989 Other specified soft tissue disorders: Secondary | ICD-10-CM | POA: Diagnosis present

## 2023-01-09 DIAGNOSIS — N179 Acute kidney failure, unspecified: Secondary | ICD-10-CM | POA: Diagnosis present

## 2023-01-09 DIAGNOSIS — R0603 Acute respiratory distress: Secondary | ICD-10-CM | POA: Diagnosis present

## 2023-01-09 DIAGNOSIS — Z87891 Personal history of nicotine dependence: Secondary | ICD-10-CM | POA: Diagnosis not present

## 2023-01-09 DIAGNOSIS — Z823 Family history of stroke: Secondary | ICD-10-CM | POA: Diagnosis not present

## 2023-01-09 DIAGNOSIS — Z1152 Encounter for screening for COVID-19: Secondary | ICD-10-CM | POA: Diagnosis not present

## 2023-01-09 LAB — HEPARIN LEVEL (UNFRACTIONATED)
Heparin Unfractionated: 0.3 [IU]/mL (ref 0.30–0.70)
Heparin Unfractionated: 0.38 [IU]/mL (ref 0.30–0.70)

## 2023-01-09 MED ORDER — AMLODIPINE BESYLATE 5 MG PO TABS
5.0000 mg | ORAL_TABLET | Freq: Every morning | ORAL | Status: DC
  Administered 2023-01-09 – 2023-01-12 (×4): 5 mg via ORAL
  Filled 2023-01-09 (×4): qty 1

## 2023-01-09 MED ORDER — OXYCODONE-ACETAMINOPHEN 5-325 MG PO TABS
1.0000 | ORAL_TABLET | Freq: Once | ORAL | Status: AC
  Administered 2023-01-09: 1 via ORAL
  Filled 2023-01-09: qty 1

## 2023-01-09 MED ORDER — LISINOPRIL 20 MG PO TABS: 40.0000 mg | ORAL_TABLET | Freq: Every day | ORAL | Status: DC

## 2023-01-09 MED ORDER — FUROSEMIDE 20 MG PO TABS: 20.0000 mg | ORAL_TABLET | Freq: Two times a day (BID) | ORAL | Status: DC

## 2023-01-09 MED ORDER — AMLODIPINE BESYLATE 5 MG PO TABS: 5.0000 mg | ORAL_TABLET | Freq: Every morning | ORAL | Status: DC

## 2023-01-09 MED ORDER — ROSUVASTATIN CALCIUM 20 MG PO TABS
20.0000 mg | ORAL_TABLET | Freq: Every day | ORAL | Status: DC
  Administered 2023-01-09 – 2023-01-12 (×4): 20 mg via ORAL
  Filled 2023-01-09 (×4): qty 1

## 2023-01-09 MED ORDER — CARVEDILOL 12.5 MG PO TABS
12.5000 mg | ORAL_TABLET | Freq: Two times a day (BID) | ORAL | Status: DC
  Administered 2023-01-09 – 2023-01-12 (×6): 12.5 mg via ORAL
  Filled 2023-01-09 (×6): qty 1

## 2023-01-09 NOTE — Progress Notes (Signed)
PHARMACY - ANTICOAGULATION CONSULT NOTE  Pharmacy Consult for Heparin Indication: pulmonary embolus  Allergies  Allergen Reactions   Kiwi Extract Itching   Pineapple Itching    Patient Measurements: Height: 5\' 5"  (165.1 cm) Weight: 116.6 kg (257 lb) IBW/kg (Calculated) : 57 Heparin Dosing Weight: 85 kg  Vital Signs: Temp: 98.4 F (36.9 C) (11/29 0800) Temp Source: Oral (11/29 0800) BP: 143/55 (11/29 1045) Pulse Rate: 80 (11/29 1045)  Labs: Recent Labs    01/08/23 2211 01/09/23 0857  HGB 14.8  --   HCT 42.8  --   PLT 230  --   HEPARINUNFRC  --  0.30  CREATININE 0.94  --   TROPONINIHS 5  --     Estimated Creatinine Clearance: 64.9 mL/min (by C-G formula based on SCr of 0.94 mg/dL).   Medical History: Past Medical History:  Diagnosis Date   Anxiety    Arthritis    Back pain    Chronic pain    Edema of both lower extremities    Fluid retention    Food allergy    GERD (gastroesophageal reflux disease)    Hyperlipidemia    Hypertension    Knee pain    LBBB (left bundle branch block)    Osteoarthritis    Pre-diabetes    Shoulder pain    Sleep apnea    possibly   Spinal stenosis    Vitamin D deficiency     Medications:  No current facility-administered medications on file prior to encounter.   Current Outpatient Medications on File Prior to Encounter  Medication Sig Dispense Refill   acetaminophen (TYLENOL) 650 MG CR tablet Take 650 mg by mouth every 8 (eight) hours as needed for pain.     amLODipine (NORVASC) 5 MG tablet Take 5 mg by mouth every morning.     Ascorbic Acid (VITAMIN C) 1000 MG tablet Take 1,000 mg by mouth daily.     carvedilol (COREG) 12.5 MG tablet Take 1 tablet (12.5 mg total) by mouth 2 (two) times daily with a meal. 60 tablet 11   cholecalciferol (VITAMIN D) 25 MCG (1000 UNIT) tablet Take 1,000 Units by mouth daily.     furosemide (LASIX) 20 MG tablet Take 20 mg by mouth 2 (two) times daily.     lisinopril (ZESTRIL) 20 MG tablet  Take 40 mg by mouth daily.     Multiple Vitamins-Minerals (CENTRUM SILVER PO) Take by mouth.     rosuvastatin (CRESTOR) 20 MG tablet Take 1 tablet (20 mg total) by mouth daily. 90 tablet 3   Vitamin D, Ergocalciferol, (DRISDOL) 1.25 MG (50000 UNIT) CAPS capsule Take 1 capsule (50,000 Units total) by mouth every 7 (seven) days. 4 capsule 0     Assessment: 77 y.o. female with PMH of DVT presented with shortness of breath. D-dimer elevated at 10.62 and diagnosed with pulmonary embolus. Pharmacy consulted to dose heparin.  Heparin level 0.3, barely therapeutic with the heparin infusion at 1400 units/h. No new labs drawn since admission 11/28 PM (Hgb 14.8, Plt 230 - WNL). No signs/symptoms of bleeding noted.   Goal of Therapy:  Heparin level 0.3-0.7 units/ml Monitor platelets by anticoagulation protocol: Yes   Plan:  Increase heparin infusion to 1450 units/h Check confirmatory heparin level in 8 hours Monitor daily CBC and for signs/symptoms of bleeding Follow up transition to oral anticoagulation  Stephenie Acres, PharmD PGY1 Pharmacy Resident 01/09/2023 12:02 PM

## 2023-01-09 NOTE — ED Notes (Signed)
Pt SPO2 88-90% while sleeping. 2LNC placed, MD made aware

## 2023-01-09 NOTE — Progress Notes (Signed)
Hospitalist Transfer Note:    Nursing staff, Please call [facility] Admits & Consults System-Wide number on Amion [phone]) as soon as patient's arrival, so appropriate admitting provider can evaluate the pt.   Transferring facility: [facility] Requesting provider: Dr. Ross Marcus (EDP at [facility]) Reason for transfer: admission for further evaluation and management of acute bilateral pulmonary emboli.     33 F w/ distant h/o DVT while on OCP's, who presented to [facility] ED complaining of cough associated with right-sided pleuritic chest discomfort, without any exertional chest pain.  D-dimer was found to be positive, and ensuing CTA chest showed evidence of bilateral pulmonary emboli, right greater than left, without radiographic evidence of right heart strain.  Vital signs in the ED were notable for the following: Afebrile; heart rates in the 80s to low 100s; normotensive; oxygen saturations in the range of 91 to 95% on room air.  Was started on heparin drip.  Subsequently, I accepted this patient for transfer for inpatient admission to a med/tele bed at [facility] or [facility]  (first available) for further work-up and management of the above.      Newton Pigg, DO Hospitalist

## 2023-01-09 NOTE — ED Notes (Signed)
[facility] at bedside to assume care of patient. 

## 2023-01-09 NOTE — Progress Notes (Signed)
PHARMACY - ANTICOAGULATION CONSULT NOTE  Pharmacy Consult for Heparin Indication: pulmonary embolus  Allergies  Allergen Reactions   Kiwi Extract Itching   Meloxicam Other (See Comments)    Hypertension   Pineapple Itching    Patient Measurements: Height: 5\' 5"  (165.1 cm) Weight: 116.4 kg (256 lb 9.9 oz) IBW/kg (Calculated) : 57 Heparin Dosing Weight: 85 kg  Vital Signs: Temp: 98.8 F (37.1 C) (11/29 2035) Temp Source: Oral (11/29 2035) BP: 120/47 (11/29 2035) Pulse Rate: 73 (11/29 2035)  Labs: Recent Labs    01/08/23 2211 01/09/23 0857 01/09/23 2011  HGB 14.8  --   --   HCT 42.8  --   --   PLT 230  --   --   HEPARINUNFRC  --  0.30 0.38  CREATININE 0.94  --   --   TROPONINIHS 5  --   --     Estimated Creatinine Clearance: 64.9 mL/min (by C-G formula based on SCr of 0.94 mg/dL).   Medical History: Past Medical History:  Diagnosis Date   Anxiety    Arthritis    Back pain    Chronic pain    Edema of both lower extremities    Fluid retention    Food allergy    GERD (gastroesophageal reflux disease)    Hyperlipidemia    Hypertension    Knee pain    LBBB (left bundle branch block)    Osteoarthritis    Pre-diabetes    Shoulder pain    Sleep apnea    possibly   Spinal stenosis    Vitamin D deficiency     Medications:  No current facility-administered medications on file prior to encounter.   Current Outpatient Medications on File Prior to Encounter  Medication Sig Dispense Refill   acetaminophen (TYLENOL) 650 MG CR tablet Take 650 mg by mouth every 8 (eight) hours as needed for pain.     amLODipine (NORVASC) 5 MG tablet Take 5 mg by mouth every morning.     Ascorbic Acid (VITAMIN C) 1000 MG tablet Take 1,000 mg by mouth daily.     carvedilol (COREG) 12.5 MG tablet Take 1 tablet (12.5 mg total) by mouth 2 (two) times daily with a meal. 60 tablet 11   cholecalciferol (VITAMIN D) 25 MCG (1000 UNIT) tablet Take 1,000 Units by mouth daily.      furosemide (LASIX) 20 MG tablet Take 20 mg by mouth 2 (two) times daily.     lisinopril (ZESTRIL) 20 MG tablet Take 40 mg by mouth daily.     Multiple Vitamins-Minerals (CENTRUM SILVER PO) Take by mouth.     rosuvastatin (CRESTOR) 20 MG tablet Take 1 tablet (20 mg total) by mouth daily. 90 tablet 3   Vitamin D, Ergocalciferol, (DRISDOL) 1.25 MG (50000 UNIT) CAPS capsule Take 1 capsule (50,000 Units total) by mouth every 7 (seven) days. (Patient not taking: Reported on 01/09/2023) 4 capsule 0     Assessment: 77 y.o. female with PMH of DVT presented with shortness of breath. D-dimer elevated at 10.62 and diagnosed with pulmonary embolus. Pharmacy consulted to dose heparin.  Heparin level 0.3, barely therapeutic with the heparin infusion at 1400 units/h. No new labs drawn since admission 11/28 PM (Hgb 14.8, Plt 230 - WNL). No signs/symptoms of bleeding noted.   PM Update: heparin level 0.38 (therapeutic) on 1450 units/hr. No issues with the infusion or bleeding reported.  Goal of Therapy:  Heparin level 0.3-0.7 units/ml Monitor platelets by anticoagulation protocol: Yes  Plan:  Continue heparin infusion at 1450 units/h Monitor daily CBC and for signs/symptoms of bleeding Follow up transition to oral anticoagulation  Loralee Pacas, PharmD, BCPS 01/09/2023 8:48 PM  Please check AMION for all [facility] Pharmacy phone numbers After 10:00 PM, call Main Pharmacy [phone]

## 2023-01-09 NOTE — ED Notes (Signed)
[name] with cl called for transport

## 2023-01-09 NOTE — H&P (Addendum)
History and Physical    AHNIA BRUNEAU French:[id number] DOB: 10-28-1945 DOA: 01/08/2023  PCP: Emilio Aspen, MD  Patient coming from: Home/ED transfer  Chief Complaint: SOB  HPI: [name] is a 77 y.o. female with medical history significant of hypertension, hyperlipidemia, CAD, and a remote history of a provoked DVT while taking oral contraceptive pills presenting with 1 week of chest pain and recent development of shortness of breath.  Around 1 week ago, the patient has been having left-sided chest pain worsening when she takes a deep breath.  A couple days ago, she started having associated shortness of breath.  This has subsequently worsened prompting her to seek immediate care.  A D-dimer in the emergency department was elevated prompting a CTA chest to be ordered which did show bilateral pulmonary emboli, right greater than left, no evidence of heart strain.  She denies any recent trauma, prolonged bedrest, surgeries, or oral estrogen usage.  ED Course: D-dimer elevated, CTA shows bilateral pulmonary emboli, troponins negative, potassium elevated at 5.4.  She was started on heparin and transferred.  Review of Systems: As per HPI otherwise 10 point review of systems negative.   Past Medical History:  Diagnosis Date   Anxiety    Arthritis    Back pain    Chronic pain    Edema of both lower extremities    Fluid retention    Food allergy    GERD (gastroesophageal reflux disease)    Hyperlipidemia    Hypertension    Knee pain    LBBB (left bundle branch block)    Osteoarthritis    Pre-diabetes    Shoulder pain    Sleep apnea    possibly   Spinal stenosis    Vitamin D deficiency     Past Surgical History:  Procedure Laterality Date   ABDOMINAL HYSTERECTOMY  1970's   ABDOMINAL HYSTERECTOMY     APPENDECTOMY  1970's   with hysterectomy   APPENDECTOMY     with hysterectomy   COLONOSCOPY     COLONOSCOPY WITH PROPOFOL N/A 10/04/2013   Procedure: COLONOSCOPY WITH  PROPOFOL;  Surgeon: Charolett Bumpers, MD;  Location: [facility];  Service: Endoscopy;  Laterality: N/A;   OOPHORECTOMY     TOTAL HIP ARTHROPLASTY  04/04/2011   Procedure: TOTAL HIP ARTHROPLASTY ANTERIOR APPROACH;  Surgeon: [name], MD;  Location: [facility];  Service: Orthopedics;  Laterality: Right;   TOTAL HIP ARTHROPLASTY       reports that she quit smoking about 11 years ago. Her smoking use included cigarettes. She started smoking about 31 years ago. She has a 20 pack-year smoking history. She has never used smokeless tobacco. She reports that she does not drink alcohol and does not use drugs.  Allergies  Allergen Reactions   Kiwi Extract Itching   Pineapple Itching    Family History  Problem Relation Age of Onset   Hyperlipidemia Mother    Hypertension Mother    Heart disease Mother    Diabetes Mother    Obesity Mother    Kidney disease Mother    Stroke Father    Hyperlipidemia Father    Hypertension Father    Heart disease Father    Heart attack Father    High Cholesterol Father    Depression Father    Anxiety disorder Father    Alcoholism Father    Prostate cancer Brother    Heart attack Brother    Heart disease Brother  Prior to Admission medications   Medication Sig Start Date End Date Taking? Authorizing Provider  acetaminophen (TYLENOL) 650 MG CR tablet Take 650 mg by mouth every 8 (eight) hours as needed for pain.    [provider]  amLODipine (NORVASC) 5 MG tablet Take 5 mg by mouth every morning.    [provider]  Ascorbic Acid (VITAMIN C) 1000 MG tablet Take 1,000 mg by mouth daily.    [provider]  carvedilol (COREG) 12.5 MG tablet Take 1 tablet (12.5 mg total) by mouth 2 (two) times daily with a meal. 06/18/14   Barrett, Joline Salt, PA-C  cholecalciferol (VITAMIN D) 25 MCG (1000 UNIT) tablet Take 1,000 Units by mouth daily.    [provider]  furosemide (LASIX) 20 MG tablet Take 20 mg by mouth 2 (two)  times daily.    [provider]  lisinopril (ZESTRIL) 20 MG tablet Take 40 mg by mouth daily. 09/13/20   [provider]  Multiple Vitamins-Minerals (CENTRUM SILVER PO) Take by mouth.    [provider]  rosuvastatin (CRESTOR) 20 MG tablet Take 1 tablet (20 mg total) by mouth daily. 11/11/22   Alver Sorrow, NP  Vitamin D, Ergocalciferol, (DRISDOL) 1.25 MG (50000 UNIT) CAPS capsule Take 1 capsule (50,000 Units total) by mouth every 7 (seven) days. 01/06/23   Langston Reusing, MD    Physical Exam: Vitals:   01/09/23 1247 01/09/23 1249 01/09/23 1253 01/09/23 1634  BP:  135/68  (!) 143/71  Pulse:   78   Resp:    14  Temp:  98.4 F (36.9 C)  98 F (36.7 C)  TempSrc:  Oral  Oral  SpO2:   97%   Weight: 116.4 kg     Height: 5\' 5"  (1.651 m)       Constitutional: NAD, calm, comfortable Eyes: PERRL, lids and conjunctivae normal ENMT: Mucous membranes are moist. Posterior pharynx clear of any exudate or lesions. Neck: normal, supple, no masses, no thyromegaly Respiratory: clear to auscultation bilaterally, no wheezing, no crackles. Normal respiratory effort. No accessory muscle use.  Cardiovascular: RRR.  2+ pitting bilateral lower extremity edema, tapering at the mid tibia on the right, tapering at the proximal third of the tibia on the left. 2+ pedal pulses. No carotid bruits.  Abdomen: no tenderness, no masses palpated. No hepatosplenomegaly. Bowel sounds positive.  Musculoskeletal: no clubbing / cyanosis. No joint deformity upper and lower extremities. Good ROM, no contractures. Normal muscle tone. + TTP over the left gastrocnemius medially, positive Homans, negative Homans on the right, no TTP over the right calf region TTP over the left trapezius musculature and left anterior chest wall over the midclavicular line between ribs 2 and 5 Skin: no rashes, lesions, ulcers.  Neurologic: CN 2-12 grossly intact. Sensation intact, DTR normal. Strength 5/5 in all 4.   Psychiatric: Normal judgment and insight. Alert and oriented x 3. Normal mood.   Labs on Admission: I have personally reviewed following labs and imaging studies  CBC: Recent Labs  Lab 01/08/23 2211  WBC 10.4  HGB 14.8  HCT 42.8  MCV 93.2  PLT 230   Basic Metabolic Panel: Recent Labs  Lab 01/08/23 2211  NA 134*  K 5.4*  CL 100  CO2 26  GLUCOSE 118*  BUN 14  CREATININE 0.94  CALCIUM 9.7   GFR: Estimated Creatinine Clearance: 64.9 mL/min (by C-G formula based on SCr of 0.94 mg/dL).  Radiological Exams on Admission: CT Angio Chest PE  W and/or Wo Contrast  Result Date: 01/08/2023 CLINICAL DATA:  Positive D-dimer cough EXAM: CT ANGIOGRAPHY CHEST WITH CONTRAST TECHNIQUE: Multidetector CT imaging of the chest was performed using the standard protocol during bolus administration of intravenous contrast. Multiplanar CT image reconstructions and MIPs were obtained to evaluate the vascular anatomy. RADIATION DOSE REDUCTION: This exam was performed according to the departmental dose-optimization program which includes automated exposure control, adjustment of the mA and/or kV according to patient size and/or use of iterative reconstruction technique. CONTRAST:  OMNIPAQUE IOHEXOL 350 MG/ML SOLN COMPARISON:  Chest x-ray 01/08/2023 FINDINGS: Cardiovascular: Satisfactory opacification of the pulmonary arteries to the segmental level. Positive for acute embolus within the distal right pulmonary artery, extending into right upper lobar, segmental and subsegmental vessels, and right middle lobe segmental pulmonary vessels. Additional emboli within right lower lobe segmental and subsegmental vessels. Several small subsegmental left lower lobe pulmonary emboli as well. Negative for right heart strain, RV LV ratio is 0.8. Nonaneurysmal aorta. No dissection. Borderline cardiac enlargement. No pericardial effusion Mediastinum/Nodes: Midline trachea. No thyroid mass. Borderline right paratracheal  node measuring 8 mm. Esophagus within normal limits Lungs/Pleura: Trace left pleural effusion. Dependent atelectasis. No consolidation or pneumothorax. Emphysema Upper Abdomen: No acute finding Musculoskeletal: No acute osseous abnormality Review of the MIP images confirms the above findings. IMPRESSION: 1. Positive for acute bilateral pulmonary emboli, right greater than left. Negative for right heart strain. 2. Trace left pleural effusion with dependent atelectasis. 3. Emphysema. 4. Borderline cardiac enlargement. Emphysema (ICD10-J43.9). Critical Value/emergent results were called by telephone at the time of interpretation on 01/08/2023 at 11:26 pm to provider Dr. Wilkie Aye, who verbally acknowledged these results. Electronically Signed   By: Jasmine Pang M.D.   On: 01/08/2023 23:26   DG Chest 2 View  Result Date: 01/08/2023 CLINICAL DATA:  chest pain cough EXAM: CHEST - 2 VIEW COMPARISON:  Chest x-ray 09/30/2020, chest x-ray 04/20/2017 FINDINGS: The heart and mediastinal contours are within normal limits. No focal consolidation. Chronic coarsened interstitial markings with no overt pulmonary edema. No pleural effusion. No pneumothorax. No acute osseous abnormality. IMPRESSION: No active cardiopulmonary disease. Electronically Signed   By: Tish Frederickson M.D.   On: 01/08/2023 22:35    EKG: Independently reviewed.   Assessment/Plan Principal Problem:   Pulmonary embolism without acute cor pulmonale (HCC)/Dyspnea  Heparin drip, appreciate pharmacy team; The 48-hour mark will be on 01/11/2023  Supplemental oxygen as needed   Active Problems:   Left calf pain  Check lower extremity duplex    AKI  Hold home lisinopril and Lasix  Monitor BMP  Noted LE edema, will hold on IVF's given mild Cr bump      Hypertension  Continue Coreg 12.5 mg twice daily, Norvasc 5 mg daily    Hyperlipidemia  Continue Crestor 20 mg daily    Bilateral lower extremity edema  Hold Lasix 20 mg twice daily      Trapezius strain  PT ordered  DVT prophylaxis: Heparin Code Status: Full  Family Communication: None at bedside Disposition Plan: 48 hrs of stability on HPN, transition to PO Consults called: none  Admission status: Inpt   Severity of Illness: The appropriate patient status for this patient is INPATIENT. Inpatient status is judged to be reasonable and necessary in order to provide the required intensity of service to ensure the patient's safety. The patient's presenting symptoms, physical exam findings, and initial radiographic and laboratory data in the context of their chronic comorbidities is felt to place them at  high risk for further clinical deterioration. Furthermore, it is not anticipated that the patient will be medically stable for discharge from the hospital within 2 midnights of admission.   * I certify that at the point of admission it is my clinical judgment that the patient will require inpatient hospital care spanning beyond 2 midnights from the point of admission due to high intensity of service, high risk for further deterioration and high frequency of surveillance required.*  Sharlene Dory, DO [facility] [email or web address] 01/09/2023, 5:03 PM

## 2023-01-09 NOTE — ED Notes (Signed)
ED TO INPATIENT HANDOFF REPORT  ED Nurse Name and Phone #: Eann Cleland, MSN, RN, PCCN [phone]  S Name/Age/Gender [name] 77 y.o. female Room/Bed: DB008/DB008  Code Status   Code Status: Prior  Home/SNF/Other Home Patient oriented to: self, place, time, and situation Is this baseline? Yes   Triage Complete: Triage complete  Chief Complaint Acute pulmonary embolism (HCC) [I26.99]  Triage Note Productice cough x "a couple days," earlier today endorsed CP, SHOB, CP worsening w inspiration, "some nausea but no vomiting." Denies known sick contact.   Hx LBBB, compliant w home meds   Allergies Allergies  Allergen Reactions   Kiwi Extract Itching   Pineapple Itching    Level of Care/Admitting Diagnosis ED Disposition     ED Disposition  Admit   Condition  --   Comment  Hospital Area: [facility] [[id number]]  Level of Care: Telemetry [5]  Admit to tele based on following criteria: Monitor for Ischemic changes  May admit patient to [facility] or [facility] if equivalent level of care is available:: Yes  Interfacility transfer: Yes  Covid Evaluation: Asymptomatic - no recent exposure (last 10 days) testing not required  Diagnosis: Acute pulmonary embolism [facility]) [[id number]]  Admitting Physician: Angie Fava [[id number]]  Attending Physician: Angie Fava [[id number]]  Certification:: I certify this patient will need inpatient services for at least 2 midnights  Expected Medical Readiness: 01/11/2023          B Medical/Surgery History Past Medical History:  Diagnosis Date   Anxiety    Arthritis    Back pain    Chronic pain    Edema of both lower extremities    Fluid retention    Food allergy    GERD (gastroesophageal reflux disease)    Hyperlipidemia    Hypertension    Knee pain    LBBB (left bundle branch block)    Osteoarthritis    Pre-diabetes    Shoulder pain    Sleep apnea    possibly   Spinal stenosis     Vitamin D deficiency    Past Surgical History:  Procedure Laterality Date   ABDOMINAL HYSTERECTOMY  1970's   ABDOMINAL HYSTERECTOMY     APPENDECTOMY  1970's   with hysterectomy   APPENDECTOMY     with hysterectomy   COLONOSCOPY     COLONOSCOPY WITH PROPOFOL N/A 10/04/2013   Procedure: COLONOSCOPY WITH PROPOFOL;  Surgeon: Charolett Bumpers, MD;  Location: [facility];  Service: Endoscopy;  Laterality: N/A;   OOPHORECTOMY     TOTAL HIP ARTHROPLASTY  04/04/2011   Procedure: TOTAL HIP ARTHROPLASTY ANTERIOR APPROACH;  Surgeon: [name], MD;  Location: [facility];  Service: Orthopedics;  Laterality: Right;   TOTAL HIP ARTHROPLASTY       A IV Location/Drains/Wounds Patient Lines/Drains/Airways Status     Active Line/Drains/Airways     Name Placement date Placement time Site Days   Peripheral IV 01/08/23 20 G Left Antecubital 01/08/23  2208  Antecubital  1            Intake/Output Last 24 hours No intake or output data in the 24 hours ending 01/09/23 1004  Labs/Imaging Results for orders placed or performed during the hospital encounter of 01/08/23 (from the past 48 hour(s))  Basic metabolic panel     Status: Abnormal   Collection Time: 01/08/23 10:11 PM  Result Value Ref Range   Sodium 134 (L) 135 - 145 mmol/L   Potassium 5.4 (  H) 3.5 - 5.1 mmol/L   Chloride 100 98 - 111 mmol/L   CO2 26 22 - 32 mmol/L   Glucose, Bld 118 (H) 70 - 99 mg/dL    Comment: Glucose reference range applies only to samples taken after fasting for at least 8 hours.   BUN 14 8 - 23 mg/dL   Creatinine, Ser 4.09 0.44 - 1.00 mg/dL   Calcium 9.7 8.9 - 81.1 mg/dL   GFR, Estimated >91 >47 mL/min    Comment: (NOTE) Calculated using the CKD-EPI Creatinine Equation (2021)    Anion gap 8 5 - 15    Comment: Performed at [facility], [address] [place], [place] [address]  CBC     Status: None   Collection Time: 01/08/23 10:11 PM  Result Value Ref Range   WBC 10.4 4.0 -  10.5 K/uL   RBC 4.59 3.87 - 5.11 MIL/uL   Hemoglobin 14.8 12.0 - 15.0 g/dL   HCT 21.3 08.6 - 57.8 %   MCV 93.2 80.0 - 100.0 fL   MCH 32.2 26.0 - 34.0 pg   MCHC 34.6 30.0 - 36.0 g/dL   RDW 46.9 62.9 - 52.8 %   Platelets 230 150 - 400 K/uL   nRBC 0.0 0.0 - 0.2 %    Comment: Performed at [facility], [address], [place], [place] [address]  Troponin I (High Sensitivity)     Status: None   Collection Time: 01/08/23 10:11 PM  Result Value Ref Range   Troponin I (High Sensitivity) 5 <18 ng/L    Comment: (NOTE) Elevated high sensitivity troponin I (hsTnI) values and significant  changes across serial measurements may suggest ACS but many other  chronic and acute conditions are known to elevate hsTnI results.  Refer to the "Links" section for chest pain algorithms and additional  guidance. Performed at [facility], [address] [place], [place] [address]   D-dimer, quantitative     Status: Abnormal   Collection Time: 01/08/23 10:15 PM  Result Value Ref Range   D-Dimer, Quant 10.62 (H) 0.00 - 0.50 ug/mL-FEU    Comment: (NOTE) At the manufacturer cut-off value of 0.5 g/mL FEU, this assay has a negative predictive value of 95-100%.This assay is intended for use in conjunction with a clinical pretest probability (PTP) assessment model to exclude pulmonary embolism (PE) and deep venous thrombosis (DVT) in outpatients suspected of PE or DVT. Results should be correlated with clinical presentation. Performed at [facility], [address] [place], [place] [address]    CT Angio Chest PE W and/or Wo Contrast  Result Date: 01/08/2023 CLINICAL DATA:  Positive D-dimer cough EXAM: CT ANGIOGRAPHY CHEST WITH CONTRAST TECHNIQUE: Multidetector CT imaging of the chest was performed using the standard protocol during bolus administration of intravenous contrast. Multiplanar CT image reconstructions and MIPs were obtained to  evaluate the vascular anatomy. RADIATION DOSE REDUCTION: This exam was performed according to the departmental dose-optimization program which includes automated exposure control, adjustment of the mA and/or kV according to patient size and/or use of iterative reconstruction technique. CONTRAST:  OMNIPAQUE IOHEXOL 350 MG/ML SOLN COMPARISON:  Chest x-ray 01/08/2023 FINDINGS: Cardiovascular: Satisfactory opacification of the pulmonary arteries to the segmental level. Positive for acute embolus within the distal right pulmonary artery, extending into right upper lobar, segmental and subsegmental vessels, and right middle lobe segmental pulmonary vessels. Additional emboli within right lower lobe segmental and subsegmental vessels. Several small subsegmental left lower lobe pulmonary emboli as  well. Negative for right heart strain, RV LV ratio is 0.8. Nonaneurysmal aorta. No dissection. Borderline cardiac enlargement. No pericardial effusion Mediastinum/Nodes: Midline trachea. No thyroid mass. Borderline right paratracheal node measuring 8 mm. Esophagus within normal limits Lungs/Pleura: Trace left pleural effusion. Dependent atelectasis. No consolidation or pneumothorax. Emphysema Upper Abdomen: No acute finding Musculoskeletal: No acute osseous abnormality Review of the MIP images confirms the above findings. IMPRESSION: 1. Positive for acute bilateral pulmonary emboli, right greater than left. Negative for right heart strain. 2. Trace left pleural effusion with dependent atelectasis. 3. Emphysema. 4. Borderline cardiac enlargement. Emphysema (ICD10-J43.9). Critical Value/emergent results were called by telephone at the time of interpretation on 01/08/2023 at 11:26 pm to provider Dr. Wilkie Aye, who verbally acknowledged these results. Electronically Signed   By: Jasmine Pang M.D.   On: 01/08/2023 23:26   DG Chest 2 View  Result Date: 01/08/2023 CLINICAL DATA:  chest pain cough EXAM: CHEST - 2 VIEW COMPARISON:   Chest x-ray 09/30/2020, chest x-ray 04/20/2017 FINDINGS: The heart and mediastinal contours are within normal limits. No focal consolidation. Chronic coarsened interstitial markings with no overt pulmonary edema. No pleural effusion. No pneumothorax. No acute osseous abnormality. IMPRESSION: No active cardiopulmonary disease. Electronically Signed   By: Tish Frederickson M.D.   On: 01/08/2023 22:35    Pending Labs Unresulted Labs (From admission, onward)     Start     Ordered   01/09/23 0855  Heparin level (unfractionated)  Once,   R        01/09/23 0855            Vitals/Pain Today's Vitals   01/09/23 0600 01/09/23 0630 01/09/23 0800 01/09/23 0809  BP: 126/74 135/81 (!) 116/49   Pulse: 66 71 65   Resp: (!) 22 (!) 22 (!) 21   Temp:   98.4 F (36.9 C)   TempSrc:   Oral   SpO2: 98% 96% 96%   Weight:    116.6 kg  Height:    5\' 5"  (1.651 m)  PainSc: 0-No pain  6      Isolation Precautions No active isolations  Medications Medications  heparin ADULT infusion 100 units/mL (25000 units/256mL) (1,400 Units/hr Intravenous Rate/Dose Verify 01/09/23 0810)  iohexol (OMNIPAQUE) 350 MG/ML injection 100 mL (100 mLs Intravenous Contrast Given 01/08/23 2304)  heparin bolus via infusion 4,000 Units (4,000 Units Intravenous Bolus from Bag 01/08/23 2352)  oxyCODONE-acetaminophen (PERCOCET/ROXICET) 5-325 MG per tablet 1 tablet (1 tablet Oral Given 01/08/23 2359)    Mobility walks with person assist     Focused Assessments Pulmonary Assessment Handoff:  Lung sounds: Bilateral Breath Sounds: Diminished O2 Device: Nasal Cannula O2 Flow Rate (L/min): 2 L/min    R Recommendations: See Admitting Provider Note  Report given to: Porfirio Oar, RN  Additional Notes:

## 2023-01-10 DIAGNOSIS — R079 Chest pain, unspecified: Secondary | ICD-10-CM

## 2023-01-10 DIAGNOSIS — I2699 Other pulmonary embolism without acute cor pulmonale: Secondary | ICD-10-CM

## 2023-01-10 DIAGNOSIS — R0603 Acute respiratory distress: Secondary | ICD-10-CM

## 2023-01-10 LAB — CBC
HCT: 38.6 % (ref 36.0–46.0)
Hemoglobin: 12.9 g/dL (ref 12.0–15.0)
MCH: 32 pg (ref 26.0–34.0)
MCHC: 33.4 g/dL (ref 30.0–36.0)
MCV: 95.8 fL (ref 80.0–100.0)
Platelets: 234 10*3/uL (ref 150–400)
RBC: 4.03 MIL/uL (ref 3.87–5.11)
RDW: 13.2 % (ref 11.5–15.5)
WBC: 7.1 10*3/uL (ref 4.0–10.5)
nRBC: 0 % (ref 0.0–0.2)

## 2023-01-10 LAB — COMPREHENSIVE METABOLIC PANEL
ALT: 11 U/L (ref 0–44)
AST: 11 U/L — ABNORMAL LOW (ref 15–41)
Albumin: 3.2 g/dL — ABNORMAL LOW (ref 3.5–5.0)
Alkaline Phosphatase: 48 U/L (ref 38–126)
Anion gap: 7 (ref 5–15)
BUN: 10 mg/dL (ref 8–23)
CO2: 23 mmol/L (ref 22–32)
Calcium: 9.1 mg/dL (ref 8.9–10.3)
Chloride: 106 mmol/L (ref 98–111)
Creatinine, Ser: 1.02 mg/dL — ABNORMAL HIGH (ref 0.44–1.00)
GFR, Estimated: 57 mL/min — ABNORMAL LOW (ref >60)
Glucose, Bld: 124 mg/dL — ABNORMAL HIGH (ref 70–99)
Potassium: 4 mmol/L (ref 3.5–5.1)
Sodium: 136 mmol/L (ref 135–145)
Total Bilirubin: 0.7 mg/dL (ref <1.2)
Total Protein: 6.4 g/dL — ABNORMAL LOW (ref 6.5–8.1)

## 2023-01-10 LAB — HEPARIN LEVEL (UNFRACTIONATED): Heparin Unfractionated: 0.4 [IU]/mL (ref 0.30–0.70)

## 2023-01-10 MED ORDER — OXYCODONE HCL 5 MG PO TABS
5.0000 mg | ORAL_TABLET | Freq: Four times a day (QID) | ORAL | Status: DC | PRN
  Administered 2023-01-10: 5 mg via ORAL
  Filled 2023-01-10: qty 1

## 2023-01-10 NOTE — Progress Notes (Signed)
PHARMACY - ANTICOAGULATION CONSULT NOTE  Pharmacy Consult for Heparin Indication: pulmonary embolus  Allergies  Allergen Reactions   Kiwi Extract Itching   Meloxicam Other (See Comments)    Hypertension   Pineapple Itching    Patient Measurements: Height: 5\' 5"  (165.1 cm) Weight: 116.4 kg (256 lb 9.9 oz) IBW/kg (Calculated) : 57 Heparin Dosing Weight: 85 kg  Vital Signs: Temp: 98.1 F (36.7 C) (11/30 0330) Temp Source: Oral (11/30 0330) BP: 120/56 (11/30 0814) Pulse Rate: 72 (11/30 0814)  Labs: Recent Labs    01/08/23 2211 01/09/23 0857 01/09/23 2011 01/10/23 0322 01/10/23 0847  HGB 14.8  --   --  12.9  --   HCT 42.8  --   --  38.6  --   PLT 230  --   --  234  --   HEPARINUNFRC  --  0.30 0.38  --  0.40  CREATININE 0.94  --   --  1.02*  --   TROPONINIHS 5  --   --   --   --     Estimated Creatinine Clearance: 59.9 mL/min (A) (by C-G formula based on SCr of 1.02 mg/dL (H)).   Medical History: Past Medical History:  Diagnosis Date   Anxiety    Arthritis    Back pain    Chronic pain    Edema of both lower extremities    Fluid retention    Food allergy    GERD (gastroesophageal reflux disease)    Hyperlipidemia    Hypertension    Knee pain    LBBB (left bundle branch block)    Osteoarthritis    Pre-diabetes    Shoulder pain    Sleep apnea    possibly   Spinal stenosis    Vitamin D deficiency     Medications:  No current facility-administered medications on file prior to encounter.   Current Outpatient Medications on File Prior to Encounter  Medication Sig Dispense Refill   acetaminophen (TYLENOL) 650 MG CR tablet Take 650 mg by mouth every 8 (eight) hours as needed for pain.     amLODipine (NORVASC) 5 MG tablet Take 5 mg by mouth every morning.     Ascorbic Acid (VITAMIN C) 1000 MG tablet Take 1,000 mg by mouth daily.     carvedilol (COREG) 12.5 MG tablet Take 1 tablet (12.5 mg total) by mouth 2 (two) times daily with a meal. 60 tablet 11    cholecalciferol (VITAMIN D) 25 MCG (1000 UNIT) tablet Take 1,000 Units by mouth daily.     furosemide (LASIX) 20 MG tablet Take 20 mg by mouth 2 (two) times daily.     lisinopril (ZESTRIL) 20 MG tablet Take 40 mg by mouth daily.     Multiple Vitamins-Minerals (CENTRUM SILVER PO) Take by mouth.     rosuvastatin (CRESTOR) 20 MG tablet Take 1 tablet (20 mg total) by mouth daily. 90 tablet 3   Vitamin D, Ergocalciferol, (DRISDOL) 1.25 MG (50000 UNIT) CAPS capsule Take 1 capsule (50,000 Units total) by mouth every 7 (seven) days. (Patient not taking: Reported on 01/09/2023) 4 capsule 0     Assessment: 77 y.o. female with PMH of DVT presented with shortness of breath. D-dimer elevated at 10.62 and diagnosed with pulmonary embolus. Pharmacy consulted to dose heparin.  11/30 AM: Heparin level 0.40, therapeutic on 1450 units/hr. No issues with infusion running or bleeding noted per RN. CBC stable (Hgb 12.9, Plt 234).  Goal of Therapy:  Heparin level 0.3-0.7 units/ml Monitor  platelets by anticoagulation protocol: Yes   Plan:  Continue heparin infusion at 1450 units/h Monitor daily CBC and for signs/symptoms of bleeding Follow up transition to oral anticoagulation  Enos Fling, PharmD PGY-1 Acute Care Pharmacy Resident 01/10/2023 11:03 AM  Please check AMION for all [facility] Pharmacy phone numbers After 10:00 PM, call Main Pharmacy [phone]

## 2023-01-10 NOTE — Evaluation (Signed)
Physical Therapy Evaluation Patient Details Name: [name] MRN: [id number] DOB: 07/08/1945 Today's Date: 01/10/2023  History of Present Illness  Patient is 77 y.o. female presented with shortness of breath. D-dimer elevated at 10.62, CTA revealed bil pulmonary emboli. PMH significant of hypertension, hyperlipidemia, CAD, and a remote history of a provoked DVT while taking oral contraceptive pills.   Clinical Impression  [name] is 77 y.o. female admitted with above HPI and diagnosis. Patient is currently limited by functional impairments below (see PT problem list). Patient lives alone in ILF and is mod ind with rollator for mobility at baseline. Currently pt most limited by DOE/SOB and is completing bed mob, transfers, and gait with rollator at supervision level. Pt noted to desaturate with mobiltiy and required 2L/min to maintain saturations>92%. Patient will benefit from continued skilled PT interventions to address impairments and progress independence with mobility, recommending follow up HHPT. Acute PT will follow and progress as able.         If plan is discharge home, recommend the following: A little help with walking and/or transfers;A little help with bathing/dressing/bathroom;Assistance with cooking/housework;Help with stairs or ramp for entrance;Assist for transportation   Can travel by private vehicle        Equipment Recommendations None recommended by PT  Recommendations for Other Services       Functional Status Assessment Patient has had a recent decline in their functional status and demonstrates the ability to make significant improvements in function in a reasonable and predictable amount of time.     Precautions / Restrictions Precautions Precautions: Fall Precaution Comments: watch SpO2 Restrictions Weight Bearing Restrictions: No      Mobility  Bed Mobility Overal bed mobility: Needs Assistance Bed Mobility: Supine to Sit     Supine to  sit: Supervision, Used rails, HOB elevated     General bed mobility comments: use of bed features, no assist    Transfers Overall transfer level: Needs assistance Equipment used: Rollator (4 wheels) Transfers: Sit to/from Stand Sit to Stand: Supervision           General transfer comment: sup for safety, no assist, pt steady with rise/lower from EOB    Ambulation/Gait Ambulation/Gait assistance: Supervision Gait Distance (Feet): 340 Feet (standign rest break halfway) Assistive device: Rollator (4 wheels) Gait Pattern/deviations: Step-through pattern, Decreased stride length, Wide base of support Gait velocity: decr     General Gait Details: Patient steady with no overt LOB, slighlty wide step width throughout gait. Pt SpO2 desating on RA with gait to 84% and recovering to 94% on 2L/min with pt report reduction in SOB.  Stairs            Wheelchair Mobility     Tilt Bed    Modified Rankin (Stroke Patients Only)       Balance                                             Pertinent Vitals/Pain Pain Assessment Pain Assessment: No/denies pain    Home Living Family/patient expects to be discharged to:: Other (Comment) (ILF, senior living; [place]) Living Arrangements: Alone Available Help at Discharge: Family Type of Home: Apartment Home Access: Level entry;Elevator       Home Layout: One level Home Equipment: Rollator (4 wheels);Cane - single point;Shower seat - built in      Prior Function Prior  Level of Function : Independent/Modified Independent;Driving                     Extremity/Trunk Assessment   Upper Extremity Assessment Upper Extremity Assessment: Overall WFL for tasks assessed    Lower Extremity Assessment Lower Extremity Assessment: Overall WFL for tasks assessed    Cervical / Trunk Assessment Cervical / Trunk Assessment: Normal;Other exceptions Cervical / Trunk Exceptions: habitus  Communication    Communication Communication: No apparent difficulties  Cognition Arousal: Alert Behavior During Therapy: WFL for tasks assessed/performed Overall Cognitive Status: Within Functional Limits for tasks assessed                                          General Comments      Exercises     Assessment/Plan    PT Assessment Patient needs continued PT services  PT Problem List Decreased strength;Decreased activity tolerance;Decreased balance;Decreased mobility;Decreased coordination;Decreased knowledge of use of DME;Obesity;Cardiopulmonary status limiting activity;Decreased knowledge of precautions;Decreased safety awareness       PT Treatment Interventions DME instruction;Neuromuscular re-education;Balance training;Therapeutic exercise;Therapeutic activities;Functional mobility training;Stair training;Gait training;Patient/family education    PT Goals (Current goals can be found in the Care Plan section)  Acute Rehab PT Goals Patient Stated Goal: recover and get home PT Goal Formulation: With patient Time For Goal Achievement: 01/24/23 Potential to Achieve Goals: Good    Frequency Min 1X/week     Co-evaluation               AM-PAC PT "6 Clicks" Mobility  Outcome Measure Help needed turning from your back to your side while in a flat bed without using bedrails?: None Help needed moving from lying on your back to sitting on the side of a flat bed without using bedrails?: None Help needed moving to and from a bed to a chair (including a wheelchair)?: A Little Help needed standing up from a chair using your arms (e.g., wheelchair or bedside chair)?: A Little Help needed to walk in hospital room?: A Little Help needed climbing 3-5 steps with a railing? : A Little 6 Click Score: 20    End of Session Equipment Utilized During Treatment: Gait belt;Oxygen Activity Tolerance: Patient tolerated treatment well Patient left: in bed;with call bell/phone within reach  (seated EOB) Nurse Communication: Mobility status PT Visit Diagnosis: Unsteadiness on feet (R26.81);Other abnormalities of gait and mobility (R26.89);Muscle weakness (generalized) (M62.81);Difficulty in walking, not elsewhere classified (R26.2)    Time: 1610-9604 PT Time Calculation (min) (ACUTE ONLY): 23 min   Charges:   PT Evaluation $PT Eval Low Complexity: 1 Low PT Treatments $Gait Training: 8-22 mins PT General Charges $$ ACUTE PT VISIT: 1 Visit         Wynn Maudlin, DPT Acute Rehabilitation Services Office [phone]  01/10/23 4:15 PM

## 2023-01-10 NOTE — Hospital Course (Signed)
77 y.o. female with medical history significant of hypertension, hyperlipidemia, CAD, and a remote history of a provoked DVT while taking oral contraceptive pills presenting with 1 week of chest pain and recent development of shortness of breath.   Around 1 week ago, the patient has been having left-sided chest pain worsening when she takes a deep breath.  A couple days ago, she started having associated shortness of breath.  This has subsequently worsened prompting her to seek immediate care.  A D-dimer in the emergency department was elevated prompting a CTA chest to be ordered which did show bilateral pulmonary emboli, right greater than left, no evidence of heart strain.  She denies any recent trauma, prolonged bedrest, surgeries, or oral estrogen usage.   ED Course: D-dimer elevated, CTA shows bilateral pulmonary emboli, troponins negative, potassium elevated at 5.4.  She was started on heparin and transferred.

## 2023-01-10 NOTE — Progress Notes (Signed)
Progress Note   Patient: [name] XBM:[id number] DOB: 12-18-1945 DOA: 01/08/2023     1 DOS: the patient was seen and examined on 01/10/2023   Brief hospital course: 77 y.o. female with medical history significant of hypertension, hyperlipidemia, CAD, and a remote history of a provoked DVT while taking oral contraceptive pills presenting with 1 week of chest pain and recent development of shortness of breath.   Around 1 week ago, the patient has been having left-sided chest pain worsening when she takes a deep breath.  A couple days ago, she started having associated shortness of breath.  This has subsequently worsened prompting her to seek immediate care.  A D-dimer in the emergency department was elevated prompting a CTA chest to be ordered which did show bilateral pulmonary emboli, right greater than left, no evidence of heart strain.  She denies any recent trauma, prolonged bedrest, surgeries, or oral estrogen usage.   ED Course: D-dimer elevated, CTA shows bilateral pulmonary emboli, troponins negative, potassium elevated at 5.4.  She was started on heparin and transferred.  Assessment and Plan: Principal Problem:   Pulmonary embolism without acute cor pulmonale (HCC)/Dyspnea             Pt continued on heparin gtt  LE dopplers reviewed, neg             Supplemental oxygen as needed, wean as tolerated  Consider Hematology referral as outpt as pt reports other family members with embolic disease              Active Problems:   Left calf pain             LE dopplers neg     AKI             Hold home lisinopril and Lasix             Monitor BMP      Hypertension             Continue Coreg 12.5 mg twice daily, Norvasc 5 mg daily  BP stable     Hyperlipidemia             Continue Crestor 20 mg daily     Bilateral lower extremity edema             Hold Lasix 20 mg twice daily for now      Trapezius strain             PT ordered   Subjective: Still reporting some  continued sob  Physical Exam: Vitals:   01/09/23 2352 01/10/23 0330 01/10/23 0814 01/10/23 1207  BP: (!) 129/53 133/63 (!) 120/56 124/64  Pulse: 75 79 72 69  Resp: 16 20  20   Temp: 98.3 F (36.8 C) 98.1 F (36.7 C)  98.2 F (36.8 C)  TempSrc: Oral Oral  Oral  SpO2: 96% 97%  96%  Weight:      Height:       General exam: Awake, laying in bed, in nad Respiratory system: Normal respiratory effort, no wheezing Cardiovascular system: regular rate, s1, s2 Gastrointestinal system: Soft, nondistended, positive BS Central nervous system: CN2-12 grossly intact, strength intact Extremities: Perfused, no clubbing Skin: Normal skin turgor, no notable skin lesions seen Psychiatry: Mood normal // no visual hallucinations   Data Reviewed:  Labs reviewed: Na 136, K 4.0, Cr 1.02, WBC 7.1, Hgb 12.9, Plts 234  Family Communication: Pt in room, family not at bedside  Disposition: Status is: Inpatient  Remains inpatient appropriate because: Severity of illness  Planned Discharge Destination: Home    Author: Rickey Barbara, MD 01/10/2023 5:17 PM  For on call review [email or web address].

## 2023-01-10 NOTE — Progress Notes (Signed)
SATURATION QUALIFICATIONS: (This note is used to comply with regulatory documentation for home oxygen)  Patient Saturations on Room Air at Rest = 90%  Patient Saturations on Room Air while Ambulating = 84%  Patient Saturations on 2 Liters of oxygen while Ambulating = 94%  Please briefly explain why patient needs home oxygen: Patient is unable to maintain adequate SpO2 with mobility on RA and currently requires 2L/min to achieve SpO2>92%. Will continue to assess.   Wynn Maudlin, DPT Acute Rehabilitation Services Office [phone]  01/10/23 4:18 PM

## 2023-01-11 ENCOUNTER — Inpatient Hospital Stay (HOSPITAL_COMMUNITY): Payer: Medicare Other

## 2023-01-11 DIAGNOSIS — R0603 Acute respiratory distress: Secondary | ICD-10-CM | POA: Diagnosis not present

## 2023-01-11 DIAGNOSIS — I2699 Other pulmonary embolism without acute cor pulmonale: Secondary | ICD-10-CM | POA: Diagnosis not present

## 2023-01-11 DIAGNOSIS — R079 Chest pain, unspecified: Secondary | ICD-10-CM | POA: Diagnosis not present

## 2023-01-11 DIAGNOSIS — I2602 Saddle embolus of pulmonary artery with acute cor pulmonale: Secondary | ICD-10-CM | POA: Diagnosis not present

## 2023-01-11 LAB — COMPREHENSIVE METABOLIC PANEL
ALT: 12 U/L (ref 0–44)
AST: 15 U/L (ref 15–41)
Albumin: 3.2 g/dL — ABNORMAL LOW (ref 3.5–5.0)
Alkaline Phosphatase: 52 U/L (ref 38–126)
Anion gap: 8 (ref 5–15)
BUN: 14 mg/dL (ref 8–23)
CO2: 23 mmol/L (ref 22–32)
Calcium: 9.1 mg/dL (ref 8.9–10.3)
Chloride: 106 mmol/L (ref 98–111)
Creatinine, Ser: 1.04 mg/dL — ABNORMAL HIGH (ref 0.44–1.00)
GFR, Estimated: 56 mL/min — ABNORMAL LOW (ref >60)
Glucose, Bld: 105 mg/dL — ABNORMAL HIGH (ref 70–99)
Potassium: 3.9 mmol/L (ref 3.5–5.1)
Sodium: 137 mmol/L (ref 135–145)
Total Bilirubin: 0.6 mg/dL (ref <1.2)
Total Protein: 6.5 g/dL (ref 6.5–8.1)

## 2023-01-11 LAB — ECHOCARDIOGRAM COMPLETE
Area-P 1/2: 2.09 cm2
Height: 65 in
S' Lateral: 2.4 cm
Weight: 4105.85 [oz_av]

## 2023-01-11 LAB — CBC
HCT: 38.1 % (ref 36.0–46.0)
Hemoglobin: 12.6 g/dL (ref 12.0–15.0)
MCH: 31.2 pg (ref 26.0–34.0)
MCHC: 33.1 g/dL (ref 30.0–36.0)
MCV: 94.3 fL (ref 80.0–100.0)
Platelets: 250 10*3/uL (ref 150–400)
RBC: 4.04 MIL/uL (ref 3.87–5.11)
RDW: 13 % (ref 11.5–15.5)
WBC: 5.2 10*3/uL (ref 4.0–10.5)
nRBC: 0 % (ref 0.0–0.2)

## 2023-01-11 LAB — HEPARIN LEVEL (UNFRACTIONATED): Heparin Unfractionated: 0.41 [IU]/mL (ref 0.30–0.70)

## 2023-01-11 MED ORDER — SIMETHICONE 80 MG PO CHEW
80.0000 mg | CHEWABLE_TABLET | Freq: Four times a day (QID) | ORAL | Status: DC | PRN
  Administered 2023-01-11: 80 mg via ORAL
  Filled 2023-01-11: qty 1

## 2023-01-11 MED ORDER — PERFLUTREN LIPID MICROSPHERE
1.0000 mL | INTRAVENOUS | Status: AC | PRN
  Administered 2023-01-11: 2 mL via INTRAVENOUS

## 2023-01-11 MED ORDER — ACETAMINOPHEN 325 MG PO TABS
650.0000 mg | ORAL_TABLET | Freq: Four times a day (QID) | ORAL | Status: DC | PRN
  Administered 2023-01-11: 650 mg via ORAL
  Filled 2023-01-11: qty 2

## 2023-01-11 MED ORDER — APIXABAN 5 MG PO TABS: 5.0000 mg | ORAL_TABLET | Freq: Two times a day (BID) | ORAL | Status: DC

## 2023-01-11 MED ORDER — APIXABAN 5 MG PO TABS
10.0000 mg | ORAL_TABLET | Freq: Two times a day (BID) | ORAL | Status: DC
  Administered 2023-01-11 – 2023-01-12 (×3): 10 mg via ORAL
  Filled 2023-01-11 (×3): qty 2

## 2023-01-11 NOTE — Progress Notes (Addendum)
PHARMACY - ANTICOAGULATION CONSULT NOTE  Pharmacy Consult for Heparin Indication: pulmonary embolus  Allergies  Allergen Reactions   Kiwi Extract Itching   Meloxicam Other (See Comments)    Hypertension   Pineapple Itching    Patient Measurements: Height: 5\' 5"  (165.1 cm) Weight: 116.4 kg (256 lb 9.9 oz) IBW/kg (Calculated) : 57 Heparin Dosing Weight: 85 kg  Vital Signs: Temp: 98 F (36.7 C) (12/01 0430) Temp Source: Oral (12/01 0430) BP: 114/52 (12/01 0430) Pulse Rate: 55 (12/01 0430)  Labs: Recent Labs    01/08/23 2211 01/09/23 0857 01/09/23 2011 01/10/23 0322 01/10/23 0847 01/11/23 0444  HGB 14.8  --   --  12.9  --  12.6  HCT 42.8  --   --  38.6  --  38.1  PLT 230  --   --  234  --  250  HEPARINUNFRC  --    < > 0.38  --  0.40 0.41  CREATININE 0.94  --   --  1.02*  --  1.04*  TROPONINIHS 5  --   --   --   --   --    < > = values in this interval not displayed.    Estimated Creatinine Clearance: 58.7 mL/min (A) (by C-G formula based on SCr of 1.04 mg/dL (H)).   Medical History: Past Medical History:  Diagnosis Date   Anxiety    Arthritis    Back pain    Chronic pain    Edema of both lower extremities    Fluid retention    Food allergy    GERD (gastroesophageal reflux disease)    Hyperlipidemia    Hypertension    Knee pain    LBBB (left bundle branch block)    Osteoarthritis    Pre-diabetes    Shoulder pain    Sleep apnea    possibly   Spinal stenosis    Vitamin D deficiency     Medications:  No current facility-administered medications on file prior to encounter.   Current Outpatient Medications on File Prior to Encounter  Medication Sig Dispense Refill   acetaminophen (TYLENOL) 650 MG CR tablet Take 650 mg by mouth every 8 (eight) hours as needed for pain.     amLODipine (NORVASC) 5 MG tablet Take 5 mg by mouth every morning.     Ascorbic Acid (VITAMIN C) 1000 MG tablet Take 1,000 mg by mouth daily.     carvedilol (COREG) 12.5 MG tablet  Take 1 tablet (12.5 mg total) by mouth 2 (two) times daily with a meal. 60 tablet 11   cholecalciferol (VITAMIN D) 25 MCG (1000 UNIT) tablet Take 1,000 Units by mouth daily.     furosemide (LASIX) 20 MG tablet Take 20 mg by mouth 2 (two) times daily.     lisinopril (ZESTRIL) 20 MG tablet Take 40 mg by mouth daily.     Multiple Vitamins-Minerals (CENTRUM SILVER PO) Take by mouth.     rosuvastatin (CRESTOR) 20 MG tablet Take 1 tablet (20 mg total) by mouth daily. 90 tablet 3   Vitamin D, Ergocalciferol, (DRISDOL) 1.25 MG (50000 UNIT) CAPS capsule Take 1 capsule (50,000 Units total) by mouth every 7 (seven) days. (Patient not taking: Reported on 01/09/2023) 4 capsule 0     Assessment: 77 y.o. female with PMH of DVT presented with shortness of breath. D-dimer elevated at 10.62 and diagnosed with pulmonary embolus. Pharmacy consulted to dose heparin.  12/1 AM: Heparin level 0.41, therapeutic on 1450 units/hr. No issues  with infusion running or bleeding noted per RN. CBC stable (Hgb 12.6, Plt 250).   Goal of Therapy:  Heparin level 0.3-0.7 units/ml Monitor platelets by anticoagulation protocol: Yes   Plan:  Continue heparin infusion at 1450 units/h Monitor daily CBC and for signs/symptoms of bleeding Follow up transition to oral anticoagulation  ADDENDUM 1300: Transitioning patient to Eliquis per MD request. Patient has remained stable on heparin for >48 hours. Start Eliquis 10 mg PO BID x7 days, then 5 mg PO BID daily. Continue to monitor CBC and signs of bleeding.   Enos Fling, PharmD PGY-1 Acute Care Pharmacy Resident 01/11/2023 7:49 AM  Please check AMION for all [facility] Pharmacy phone numbers After 10:00 PM, call Main Pharmacy [phone]

## 2023-01-11 NOTE — Progress Notes (Signed)
Progress Note   Patient: [name]:[id number] DOB: Jan 09, 1946 DOA: 01/08/2023     2 DOS: the patient was seen and examined on 01/11/2023   Brief hospital course: 77 y.o. female with medical history significant of hypertension, hyperlipidemia, CAD, and a remote history of a provoked DVT while taking oral contraceptive pills presenting with 1 week of chest pain and recent development of shortness of breath.   Around 1 week ago, the patient has been having left-sided chest pain worsening when she takes a deep breath.  A couple days ago, she started having associated shortness of breath.  This has subsequently worsened prompting her to seek immediate care.  A D-dimer in the emergency department was elevated prompting a CTA chest to be ordered which did show bilateral pulmonary emboli, right greater than left, no evidence of heart strain.  She denies any recent trauma, prolonged bedrest, surgeries, or oral estrogen usage.   ED Course: D-dimer elevated, CTA shows bilateral pulmonary emboli, troponins negative, potassium elevated at 5.4.  She was started on heparin and transferred.  Assessment and Plan: Principal Problem:   Pulmonary embolism without acute cor pulmonale (HCC)/Dyspnea             Pt continued on heparin gtt  LE dopplers reviewed, neg             Supplemental oxygen as needed, wean as tolerated  Consider Hematology referral as outpt as pt reports other family members with embolic disease  2d echo ordered and reviewed. Normal LVEF with normal RV function  Today, remains hypoxemic on ambulation, but symptomatically improving              Active Problems:   Left calf pain             LE dopplers neg     AKI             Hold home lisinopril and Lasix             Monitor BMP      Hypertension             Continue Coreg 12.5 mg twice daily, Norvasc 5 mg daily  BP stable     Hyperlipidemia             Continue Crestor 20 mg daily     Bilateral lower extremity edema              Cont to hold Lasix 20 mg twice daily for now      Trapezius strain             PT ordered   Subjective: Without chest pain. Reports feeling overall better  Physical Exam: Vitals:   01/10/23 2024 01/11/23 0430 01/11/23 0855 01/11/23 1437  BP: (!) 112/54 (!) 114/52 (!) 135/54 (!) 118/54  Pulse: 60 (!) 55 65 63  Resp: 18 16  18   Temp: 97.9 F (36.6 C) 98 F (36.7 C)  97.9 F (36.6 C)  TempSrc: Oral Oral  Oral  SpO2: 96% 96%  98%  Weight:      Height:       General exam: Conversant, in no acute distress Respiratory system: normal chest rise, clear, no audible wheezing Cardiovascular system: regular rhythm, s1-s2 Gastrointestinal system: Nondistended, nontender, pos BS Central nervous system: No seizures, no tremors Extremities: No cyanosis, no joint deformities Skin: No rashes, no pallor Psychiatry: Affect normal // no auditory hallucinations   Data Reviewed:  Labs reviewed: Na 137,  K 3.9, Cr 1.04, WBC 5.2, Hgb 12.6, Plts 250  Family Communication: Pt in room, family not at bedside  Disposition: Status is: Inpatient Remains inpatient appropriate because: Severity of illness  Planned Discharge Destination: Home    Author: Rickey Barbara, MD 01/11/2023 3:46 PM  For on call review [email or web address].

## 2023-01-11 NOTE — Plan of Care (Signed)
  Problem: Education: Goal: Knowledge of General Education information will improve Description: Including pain rating scale, medication(s)/side effects and non-pharmacologic comfort measures Outcome: Progressing   Problem: Health Behavior/Discharge Planning: Goal: Ability to manage health-related needs will improve Outcome: Progressing   Problem: Clinical Measurements: Goal: Ability to maintain clinical measurements within normal limits will improve Outcome: Progressing Goal: Will remain free from infection Outcome: Progressing Goal: Diagnostic test results will improve Outcome: Progressing Goal: Respiratory complications will improve Outcome: Progressing Goal: Cardiovascular complication will be avoided Outcome: Progressing   Problem: Activity: Goal: Risk for activity intolerance will decrease Outcome: Progressing   Problem: Nutrition: Goal: Adequate nutrition will be maintained Outcome: Progressing   Problem: Pain Management: Goal: General experience of comfort will improve Outcome: Progressing   Problem: Safety: Goal: Ability to remain free from injury will improve Outcome: Progressing   Problem: Skin Integrity: Goal: Risk for impaired skin integrity will decrease Outcome: Progressing   Problem: Consults Goal: Pharmacy Consult for anticoagulation Outcome: Progressing

## 2023-01-11 NOTE — Progress Notes (Signed)
SATURATION QUALIFICATIONS: (This note is used to comply with regulatory documentation for home oxygen)  Patient Saturations on Room Air at Rest = 93%  Patient Saturations on Room Air while Ambulating = 88%  Patient Saturations on n/a Liters of oxygen while Ambulating = n/a%

## 2023-01-11 NOTE — Progress Notes (Signed)
[facility] night cross cover note:   Prn simethicone added per patient request.     Newton Pigg, DO Hospitalist

## 2023-01-11 NOTE — Progress Notes (Signed)
Echocardiogram 2D Echocardiogram has been performed.  Warren Lacy Iram Lundberg RDCS 01/11/2023, 2:58 PM

## 2023-01-12 ENCOUNTER — Telehealth: Payer: Self-pay

## 2023-01-12 ENCOUNTER — Telehealth (HOSPITAL_COMMUNITY): Payer: Self-pay | Admitting: Pharmacy Technician

## 2023-01-12 ENCOUNTER — Other Ambulatory Visit (HOSPITAL_COMMUNITY): Payer: Self-pay

## 2023-01-12 DIAGNOSIS — R0603 Acute respiratory distress: Secondary | ICD-10-CM | POA: Diagnosis not present

## 2023-01-12 DIAGNOSIS — R079 Chest pain, unspecified: Secondary | ICD-10-CM | POA: Diagnosis not present

## 2023-01-12 DIAGNOSIS — I2699 Other pulmonary embolism without acute cor pulmonale: Secondary | ICD-10-CM | POA: Diagnosis not present

## 2023-01-12 LAB — CBC
HCT: 37.9 % (ref 36.0–46.0)
Hemoglobin: 12.7 g/dL (ref 12.0–15.0)
MCH: 31.5 pg (ref 26.0–34.0)
MCHC: 33.5 g/dL (ref 30.0–36.0)
MCV: 94 fL (ref 80.0–100.0)
Platelets: 282 10*3/uL (ref 150–400)
RBC: 4.03 MIL/uL (ref 3.87–5.11)
RDW: 13 % (ref 11.5–15.5)
WBC: 5.4 10*3/uL (ref 4.0–10.5)
nRBC: 0 % (ref 0.0–0.2)

## 2023-01-12 LAB — COMPREHENSIVE METABOLIC PANEL
ALT: 16 U/L (ref 0–44)
AST: 25 U/L (ref 15–41)
Albumin: 3.2 g/dL — ABNORMAL LOW (ref 3.5–5.0)
Alkaline Phosphatase: 50 U/L (ref 38–126)
Anion gap: 11 (ref 5–15)
BUN: 13 mg/dL (ref 8–23)
CO2: 25 mmol/L (ref 22–32)
Calcium: 9.5 mg/dL (ref 8.9–10.3)
Chloride: 105 mmol/L (ref 98–111)
Creatinine, Ser: 0.93 mg/dL (ref 0.44–1.00)
GFR, Estimated: >60 mL/min (ref >60)
Glucose, Bld: 101 mg/dL — ABNORMAL HIGH (ref 70–99)
Potassium: 4.3 mmol/L (ref 3.5–5.1)
Sodium: 141 mmol/L (ref 135–145)
Total Bilirubin: 0.4 mg/dL (ref <1.2)
Total Protein: 6.5 g/dL (ref 6.5–8.1)

## 2023-01-12 MED ORDER — APIXABAN (ELIQUIS) VTE STARTER PACK (10MG AND 5MG)
ORAL_TABLET | ORAL | 0 refills | Status: DC
  Filled 2023-01-12: qty 74, 30d supply, fill #0

## 2023-01-12 NOTE — Telephone Encounter (Signed)

## 2023-01-12 NOTE — Telephone Encounter (Signed)
Called after receiving text message from pt that she is in hospital w/PE. Supposed to be discharged today, will need another referral to [facility] when she is medically stable/able. Have her tentatively scheduled to re start on 02/17/23, every T/Th at 2; will contact at end of December to confirm and request new referral.

## 2023-01-12 NOTE — Care Management Important Message (Signed)
Important Message  Patient Details  Name: [name] MRN: [id number] Date of Birth: Jul 15, 1945   Important Message Given:  Yes - Medicare IM     Sherilyn Banker 01/12/2023, 4:25 PM

## 2023-01-12 NOTE — Discharge Instructions (Addendum)
   _______________________________________________________________________________________________________________________________________ Information on my medicine - ELIQUIS (apixaban)  This medication education was reviewed with me or my healthcare representative as part of my discharge preparation.   Why was Eliquis prescribed for you? Eliquis was prescribed to treat blood clots that may have been found in the veins of your legs (deep vein thrombosis) or in your lungs (pulmonary embolism) and to reduce the risk of them occurring again.  What do You need to know about Eliquis ? The starting dose is 10 mg (two 5 mg tablets) taken TWICE daily for the FIRST SEVEN (7) DAYS, then on 03/09/22  the dose is reduced to ONE 5 mg tablet taken TWICE daily.  Eliquis may be taken with or without food.   Try to take the dose about the same time in the morning and in the evening. If you have difficulty swallowing the tablet whole please discuss with your pharmacist how to take the medication safely.  Take Eliquis exactly as prescribed and DO NOT stop taking Eliquis without talking to the doctor who prescribed the medication.  Stopping may increase your risk of developing a new blood clot.  Refill your prescription before you run out.  After discharge, you should have regular check-up appointments with your healthcare provider that is prescribing your Eliquis.    What do you do if you miss a dose? If a dose of ELIQUIS is not taken at the scheduled time, take it as soon as possible on the same day and twice-daily administration should be resumed. The dose should not be doubled to make up for a missed dose.  Important Safety Information A possible side effect of Eliquis is bleeding. You should call your healthcare provider right away if you experience any of the following: Bleeding from an injury or your nose that does not stop. Unusual colored urine (red or dark brown) or unusual colored stools  (red or black). Unusual bruising for unknown reasons. A serious fall or if you hit your head (even if there is no bleeding).  Some medicines may interact with Eliquis and might increase your risk of bleeding or clotting while on Eliquis. To help avoid this, consult your healthcare provider or pharmacist prior to using any new prescription or non-prescription medications, including herbals, vitamins, non-steroidal anti-inflammatory drugs (NSAIDs) and supplements.  This website has more information on Eliquis (apixaban): http://www.eliquis.com/eliquis/home

## 2023-01-12 NOTE — Progress Notes (Signed)
Nurse requested Mobility Specialist to perform oxygen saturation test with pt which includes removing pt from oxygen both at rest and while ambulating.  Below are the results from that testing.     Patient Saturations on Room Air at Rest = spO2 93%  Patient Saturations on Room Air while Ambulating = sp02 93% .   At end of testing pt left in room on 0  Liters of oxygen.  Reported results to nurse.  D'Vante Earlene Plater Mobility Specialist Please contact via Special educational needs teacher or Rehab office at [phone]

## 2023-01-12 NOTE — Discharge Summary (Signed)
Physician Discharge Summary   Patient: [name] MRN: [id number] DOB: 1945-09-10  Admit date:     01/08/2023  Discharge date: 01/12/23  Discharge Physician: Rickey Barbara   PCP: Emilio Aspen, MD   Recommendations at discharge:    Follow up with PCP in 1-2 weeks Follow up with Hematology as will be scheduled  Discharge Diagnoses: Principal Problem:   Respiratory distress Active Problems:   Hypertension   Hyperlipidemia   Pulmonary embolism without acute cor pulmonale (HCC)   Pain of left calf   Localized swelling of both lower extremities   AKI (acute kidney injury) (HCC)  Resolved Problems:   Chest pain   Dyspnea  Hospital Course: 77 y.o. female with medical history significant of hypertension, hyperlipidemia, CAD, and a remote history of a provoked DVT while taking oral contraceptive pills presenting with 1 week of chest pain and recent development of shortness of breath.   Around 1 week ago, the patient has been having left-sided chest pain worsening when she takes a deep breath.  A couple days ago, she started having associated shortness of breath.  This has subsequently worsened prompting her to seek immediate care.  A D-dimer in the emergency department was elevated prompting a CTA chest to be ordered which did show bilateral pulmonary emboli, right greater than left, no evidence of heart strain.  She denies any recent trauma, prolonged bedrest, surgeries, or oral estrogen usage.   ED Course: D-dimer elevated, CTA shows bilateral pulmonary emboli, troponins negative, potassium elevated at 5.4.  She was started on heparin and transferred.  Assessment and Plan: Principal Problem:   Pulmonary embolism without acute cor pulmonale (HCC)/Dyspnea             Pt continued on heparin gtt             LE dopplers reviewed, neg             Supplemental oxygen as needed, wean as tolerated             Have placed request for Hematology referral as outpt as pt reports  other family members with may have embolic disease             2d echo ordered and reviewed. Normal LVEF with normal RV function             Today, ambulates well on RA, weaned off O2  Continue with eliquis on d/c. Defer to Hematology as to duration              Active Problems:   Left calf pain             LE dopplers neg     AKI             Held home lisinopril and Lasix given stable bp           Hypertension             Continued Coreg 12.5 mg twice daily, Norvasc 5 mg daily             BP stable     Hyperlipidemia             Continue Crestor 20 mg daily     Bilateral lower extremity edema             Held home lasix      Trapezius strain             PT ordered  Consultants:  Procedures performed:   Disposition: Home Diet recommendation:  Regular diet DISCHARGE MEDICATION: Allergies as of 01/12/2023       Reactions   Kiwi Extract Itching   Meloxicam Other (See Comments)   Hypertension   Pineapple Itching        Medication List     STOP taking these medications    furosemide 20 MG tablet Commonly known as: LASIX   lisinopril 20 MG tablet Commonly known as: ZESTRIL       TAKE these medications    acetaminophen 650 MG CR tablet Commonly known as: TYLENOL Take 650 mg by mouth every 8 (eight) hours as needed for pain.   amLODipine 5 MG tablet Commonly known as: NORVASC Take 5 mg by mouth every morning.   Apixaban Starter Pack (10mg  and 5mg ) Commonly known as: ELIQUIS STARTER PACK Take as directed on package: start with two-5mg  tablets twice daily for 7 days. On day 8, switch to one-5mg  tablet twice daily.   carvedilol 12.5 MG tablet Commonly known as: Coreg Take 1 tablet (12.5 mg total) by mouth 2 (two) times daily with a meal.   CENTRUM SILVER PO Take by mouth.   cholecalciferol 25 MCG (1000 UNIT) tablet Commonly known as: VITAMIN D3 Take 1,000 Units by mouth daily.   rosuvastatin 20 MG tablet Commonly known as: CRESTOR Take 1  tablet (20 mg total) by mouth daily.   vitamin C 1000 MG tablet Take 1,000 mg by mouth daily.   Vitamin D (Ergocalciferol) 1.25 MG (50000 UNIT) Caps capsule Commonly known as: DRISDOL Take 1 capsule (50,000 Units total) by mouth every 7 (seven) days.        Follow-up Information     [place] Follow up.   Why: Someone from [facility] will contact you to arrange start date and time for your physical therapy. Contact information: [address] [place] [address] [phone]         Emilio Aspen, MD Follow up in 2 week(s).   Specialty: Internal Medicine Why: Hospital follow up Contact information: [address] [place] [address] [phone]                Discharge Exam: Ceasar Mons Weights   01/09/23 0809 01/09/23 1247  Weight: 116.6 kg 116.4 kg   General exam: Awake, laying in bed, in nad Respiratory system: Normal respiratory effort, no wheezing Cardiovascular system: regular rate, s1, s2 Gastrointestinal system: Soft, nondistended, positive BS Central nervous system: CN2-12 grossly intact, strength intact Extremities: Perfused, no clubbing Skin: Normal skin turgor, no notable skin lesions seen Psychiatry: Mood normal // no visual hallucinations   Condition at discharge: fair  The results of significant diagnostics from this hospitalization (including imaging, microbiology, ancillary and laboratory) are listed below for reference.   Imaging Studies: ECHOCARDIOGRAM COMPLETE  Result Date: 01/11/2023    ECHOCARDIOGRAM REPORT   Patient Name:   [name] Date of Exam: 01/11/2023 Medical Rec #:  [id number]      Height:       65.0 in Accession #:    [id number]     Weight:       256.6 lb Date of Birth:  1945-06-09      BSA:          2.199 m Patient Age:    77 years       BP:           135/54 mmHg  Patient Gender: F              HR:           65 bpm. Exam Location:  Inpatient Procedure:  2D Echo, Color Doppler, Cardiac Doppler and Intracardiac            Opacification Agent Indications:    I26.02 Pulmonary embolus  History:        Patient has prior history of Echocardiogram examinations, most                 recent 04/21/2017. Risk Factors:Hypertension, Dyslipidemia and                 Sleep Apnea.  Sonographer:    Irving Burton Senior RDCS Referring Phys: [id number] Bennye Nix K Zavia Pullen IMPRESSIONS  1. Left ventricular ejection fraction, by estimation, is 60 to 65%. The left ventricle has normal function. The left ventricle has no regional wall motion abnormalities. Left ventricular diastolic parameters are consistent with Grade I diastolic dysfunction (impaired relaxation).  2. Right ventricular systolic function is normal. The right ventricular size is normal. There is normal pulmonary artery systolic pressure. The estimated right ventricular systolic pressure is 35.5 mmHg.  3. The mitral valve is grossly normal. Trivial mitral valve regurgitation. No evidence of mitral stenosis.  4. The aortic valve is grossly normal. Aortic valve regurgitation is not visualized. No aortic stenosis is present.  5. The inferior vena cava is normal in size with greater than 50% respiratory variability, suggesting right atrial pressure of 3 mmHg. FINDINGS  Left Ventricle: Left ventricular ejection fraction, by estimation, is 60 to 65%. The left ventricle has normal function. The left ventricle has no regional wall motion abnormalities. The left ventricular internal cavity size was normal in size. There is  no left ventricular hypertrophy. Left ventricular diastolic parameters are consistent with Grade I diastolic dysfunction (impaired relaxation). Right Ventricle: The right ventricular size is normal. No increase in right ventricular wall thickness. Right ventricular systolic function is normal. There is normal pulmonary artery systolic pressure. The tricuspid regurgitant velocity is 2.85 m/s, and  with an assumed right atrial pressure  of 3 mmHg, the estimated right ventricular systolic pressure is 35.5 mmHg. Left Atrium: Left atrial size was normal in size. Right Atrium: Right atrial size was normal in size. Pericardium: There is no evidence of pericardial effusion. Mitral Valve: The mitral valve is grossly normal. Trivial mitral valve regurgitation. No evidence of mitral valve stenosis. Tricuspid Valve: The tricuspid valve is grossly normal. Tricuspid valve regurgitation is trivial. No evidence of tricuspid stenosis. Aortic Valve: The aortic valve is grossly normal. Aortic valve regurgitation is not visualized. No aortic stenosis is present. Pulmonic Valve: The pulmonic valve was grossly normal. Pulmonic valve regurgitation is trivial. No evidence of pulmonic stenosis. Aorta: The aortic root and ascending aorta are structurally normal, with no evidence of dilitation. Venous: The inferior vena cava is normal in size with greater than 50% respiratory variability, suggesting right atrial pressure of 3 mmHg. IAS/Shunts: The atrial septum is grossly normal.  LEFT VENTRICLE PLAX 2D LVIDd:         3.50 cm   Diastology LVIDs:         2.40 cm   LV e' medial:    6.96 cm/s LV PW:         1.00 cm   LV E/e' medial:  7.9 LV IVS:        1.10 cm   LV e' lateral:   7.94  cm/s LVOT diam:     1.80 cm   LV E/e' lateral: 7.0 LV SV:         69 LV SV Index:   31 LVOT Area:     2.54 cm  RIGHT VENTRICLE RV S prime:     13.40 cm/s TAPSE (M-mode): 2.3 cm LEFT ATRIUM             Index        RIGHT ATRIUM           Index LA diam:        3.20 cm 1.46 cm/m   RA Area:     22.10 cm LA Vol (A2C):   45.1 ml 20.51 ml/m  RA Volume:   60.70 ml  27.60 ml/m LA Vol (A4C):   46.9 ml 21.33 ml/m LA Biplane Vol: 45.6 ml 20.73 ml/m  AORTIC VALVE LVOT Vmax:   128.00 cm/s LVOT Vmean:  94.900 cm/s LVOT VTI:    0.272 m  AORTA Ao Root diam: 3.10 cm Ao Asc diam:  3.20 cm MITRAL VALVE               TRICUSPID VALVE MV Area (PHT): 2.09 cm    TR Peak grad:   32.5 mmHg MV Decel Time: 363 msec     TR Vmax:        285.00 cm/s MV E velocity: 55.30 cm/s MV A velocity: 68.60 cm/s  SHUNTS MV E/A ratio:  0.81        Systemic VTI:  0.27 m                            Systemic Diam: 1.80 cm Lennie Odor MD Electronically signed by Lennie Odor MD Signature Date/Time: 01/11/2023/3:08:57 PM    Final    VAS [place] LOWER EXTREMITY VENOUS (DVT)  Result Date: 01/10/2023  Lower Venous DVT Study Patient Name:  [name]  Date of Exam:   01/09/2023 Medical Rec #: [id number]       Accession #:    [id number] Date of Birth: Feb 01, 1946       Patient Gender: F Patient Age:   57 years Exam Location:  [facility] Procedure:      VAS [place] LOWER EXTREMITY VENOUS (DVT) Referring Phys: Arva Chafe --------------------------------------------------------------------------------  Indications: Pain, Edema, SOB, and H/O DVT, left sided chest pain.  Risk Factors: Confirmed PE. Comparison Study: Previous study on 6.6.2017. Performing Technologist: Fernande Bras  Examination Guidelines: A complete evaluation includes B-mode imaging, spectral Doppler, color Doppler, and power Doppler as needed of all accessible portions of each vessel. Bilateral testing is considered an integral part of a complete examination. Limited examinations for reoccurring indications may be performed as noted. The reflux portion of the exam is performed with the patient in reverse Trendelenburg.  +-----+---------------+---------+-----------+----------+--------------+ RIGHTCompressibilityPhasicitySpontaneityPropertiesThrombus Aging +-----+---------------+---------+-----------+----------+--------------+ CFV  Full           Yes      Yes                                 +-----+---------------+---------+-----------+----------+--------------+   +---------+---------------+---------+-----------+----------+-------------------+ LEFT     CompressibilityPhasicitySpontaneityPropertiesThrombus Aging       +---------+---------------+---------+-----------+----------+-------------------+ CFV      Full           Yes      Yes                                      +---------+---------------+---------+-----------+----------+-------------------+  SFJ      Full                                                             +---------+---------------+---------+-----------+----------+-------------------+ FV Prox  Full                                                             +---------+---------------+---------+-----------+----------+-------------------+ FV Mid   Full                                                             +---------+---------------+---------+-----------+----------+-------------------+ FV DistalFull                                                             +---------+---------------+---------+-----------+----------+-------------------+ PFV      Full                                                             +---------+---------------+---------+-----------+----------+-------------------+ POP      Full           Yes      Yes                                      +---------+---------------+---------+-----------+----------+-------------------+ PTV                                                   Not well seen,                                                            patent by color.    +---------+---------------+---------+-----------+----------+-------------------+ PERO     Full                                                             +---------+---------------+---------+-----------+----------+-------------------+    Summary: RIGHT: - No evidence of common femoral vein obstruction.   LEFT: - There is no evidence  of deep vein thrombosis in the lower extremity.  - No cystic structure found in the popliteal fossa.  *See table(s) above for measurements and observations. Electronically signed by Sherald Hess MD on 01/10/2023 at 10:00:11  AM.    Final    CT Angio Chest PE W and/or Wo Contrast  Result Date: 01/08/2023 CLINICAL DATA:  Positive D-dimer cough EXAM: CT ANGIOGRAPHY CHEST WITH CONTRAST TECHNIQUE: Multidetector CT imaging of the chest was performed using the standard protocol during bolus administration of intravenous contrast. Multiplanar CT image reconstructions and MIPs were obtained to evaluate the vascular anatomy. RADIATION DOSE REDUCTION: This exam was performed according to the departmental dose-optimization program which includes automated exposure control, adjustment of the mA and/or kV according to patient size and/or use of iterative reconstruction technique. CONTRAST:  OMNIPAQUE IOHEXOL 350 MG/ML SOLN COMPARISON:  Chest x-ray 01/08/2023 FINDINGS: Cardiovascular: Satisfactory opacification of the pulmonary arteries to the segmental level. Positive for acute embolus within the distal right pulmonary artery, extending into right upper lobar, segmental and subsegmental vessels, and right middle lobe segmental pulmonary vessels. Additional emboli within right lower lobe segmental and subsegmental vessels. Several small subsegmental left lower lobe pulmonary emboli as well. Negative for right heart strain, RV LV ratio is 0.8. Nonaneurysmal aorta. No dissection. Borderline cardiac enlargement. No pericardial effusion Mediastinum/Nodes: Midline trachea. No thyroid mass. Borderline right paratracheal node measuring 8 mm. Esophagus within normal limits Lungs/Pleura: Trace left pleural effusion. Dependent atelectasis. No consolidation or pneumothorax. Emphysema Upper Abdomen: No acute finding Musculoskeletal: No acute osseous abnormality Review of the MIP images confirms the above findings. IMPRESSION: 1. Positive for acute bilateral pulmonary emboli, right greater than left. Negative for right heart strain. 2. Trace left pleural effusion with dependent atelectasis. 3. Emphysema. 4. Borderline cardiac enlargement. Emphysema  (ICD10-J43.9). Critical Value/emergent results were called by telephone at the time of interpretation on 01/08/2023 at 11:26 pm to provider Dr. Wilkie Aye, who verbally acknowledged these results. Electronically Signed   By: Jasmine Pang M.D.   On: 01/08/2023 23:26   DG Chest 2 View  Result Date: 01/08/2023 CLINICAL DATA:  chest pain cough EXAM: CHEST - 2 VIEW COMPARISON:  Chest x-ray 09/30/2020, chest x-ray 04/20/2017 FINDINGS: The heart and mediastinal contours are within normal limits. No focal consolidation. Chronic coarsened interstitial markings with no overt pulmonary edema. No pleural effusion. No pneumothorax. No acute osseous abnormality. IMPRESSION: No active cardiopulmonary disease. Electronically Signed   By: Tish Frederickson M.D.   On: 01/08/2023 22:35    Microbiology: Results for orders placed or performed during the hospital encounter of 10/20/19  SARS CORONAVIRUS 2 (TAT 6-24 HRS) Nasopharyngeal Nasopharyngeal Swab     Status: None   Collection Time: 10/20/19 12:57 PM   Specimen: Nasopharyngeal Swab  Result Value Ref Range Status   SARS Coronavirus 2 NEGATIVE NEGATIVE Final    Comment: (NOTE) SARS-CoV-2 target nucleic acids are NOT DETECTED.  The SARS-CoV-2 RNA is generally detectable in upper and lower respiratory specimens during the acute phase of infection. Negative results do not preclude SARS-CoV-2 infection, do not rule out co-infections with other pathogens, and should not be used as the sole basis for treatment or other patient management decisions. Negative results must be combined with clinical observations, patient history, and epidemiological information. The expected result is Negative.  Fact Sheet for Patients: HairSlick.no  Fact Sheet for Healthcare Providers: quierodirigir.com  This test is not yet approved or cleared by the [place] FDA and  has been authorized for detection and/or  diagnosis of  SARS-CoV-2 by FDA under an Emergency Use Authorization (EUA). This EUA will remain  in effect (meaning this test can be used) for the duration of the COVID-19 declaration under Se ction 564(b)(1) of the Act, 21 U.S.C. section 360bbb-3(b)(1), unless the authorization is terminated or revoked sooner.  Performed at [facility], [address] [place], [place] [address]     Labs: CBC: Recent Labs  Lab 01/08/23 2211 01/10/23 0322 01/11/23 0444 01/12/23 0622  WBC 10.4 7.1 5.2 5.4  HGB 14.8 12.9 12.6 12.7  HCT 42.8 38.6 38.1 37.9  MCV 93.2 95.8 94.3 94.0  PLT 230 234 250 282   Basic Metabolic Panel: Recent Labs  Lab 01/08/23 2211 01/10/23 0322 01/11/23 0444 01/12/23 0622  NA 134* 136 137 141  K 5.4* 4.0 3.9 4.3  CL 100 106 106 105  CO2 26 23 23 25   GLUCOSE 118* 124* 105* 101*  BUN 14 10 14 13   CREATININE 0.94 1.02* 1.04* 0.93  CALCIUM 9.7 9.1 9.1 9.5   Liver Function Tests: Recent Labs  Lab 01/10/23 0322 01/11/23 0444 01/12/23 0622  AST 11* 15 25  ALT 11 12 16   ALKPHOS 48 52 50  BILITOT 0.7 0.6 0.4  PROT 6.4* 6.5 6.5  ALBUMIN 3.2* 3.2* 3.2*   CBG: No results for input(s): "GLUCAP" in the last 168 hours.  Discharge time spent: less than 30 minutes.  Signed: Rickey Barbara, MD [facility] 01/12/2023

## 2023-01-12 NOTE — TOC Progression Note (Signed)
Transition of Care [facility]) - Progression Note    Patient Details  Name: [name] MRN: [id number] Date of Birth: 1945-02-14  [facility] CM/SW Contact  Huston Foley Jacklynn Ganong, RN Phone Number: 01/12/2023, 10:04 AM  Clinical Narrative:    Case Manager spoke with patient concerning recommendation for HHPT. Patient states she has used [facility] in the past and wants to do so now. Referral called to Adele Dan, [facility] Liaison.    Expected Discharge Plan: Home w Home Health Services Barriers to Discharge: Continued Medical Work up  Expected Discharge Plan and Services In-house Referral: NA Discharge Planning Services: CM Consult Post Acute Care Choice: Home Health Living arrangements for the past 2 months: Apartment                 DME Arranged: N/A DME Agency: NA       HH Arranged: PT HH Agency: [facility] Date HH Agency Contacted: 01/12/23 Time HH Agency Contacted: [phone] Representative spoke with at [facility] Agency: Adele Dan   Social Determinants of Health (SDOH) Interventions SDOH Screenings   Food Insecurity: No Food Insecurity (01/09/2023)  Housing: Low Risk  (01/09/2023)  Transportation Needs: No Transportation Needs (01/09/2023)  Utilities: Not At Risk (01/09/2023)  Depression (PHQ2-9): Medium Risk (12/12/2019)  Tobacco Use: Medium Risk (01/08/2023)    Readmission Risk Interventions     No data to display

## 2023-01-12 NOTE — Progress Notes (Signed)
Mobility Specialist Progress Note:   01/12/23 1100  Oxygen Therapy  O2 Device Room Air  Mobility  Activity Ambulated with assistance in hallway  Level of Assistance Standby assist, set-up cues, supervision of patient - no hands on  Assistive Device Four wheel walker  Distance Ambulated (ft) 380 ft  Activity Response Tolerated well  Mobility Referral Yes  $Mobility charge 1 Mobility  Mobility Specialist Start Time (ACUTE ONLY) 1058  Mobility Specialist Stop Time (ACUTE ONLY) 1110  Mobility Specialist Time Calculation (min) (ACUTE ONLY) 12 min    Pre Mobility:  93% SpO2 During Mobility:  93% SpO2 Post Mobility:  91% SpO2  Received pt on EOB having no complaints and agreeable to mobility. Pt was asymptomatic throughout ambulation and returned to room w/o fault. SpO2 in 90s throughout on room air. Left on EOB w/ call bell in reach and all needs met.   [place]'Vante Earlene Plater Mobility Specialist Please contact via Special educational needs teacher or Rehab office at [phone]

## 2023-01-13 DIAGNOSIS — E559 Vitamin D deficiency, unspecified: Secondary | ICD-10-CM | POA: Diagnosis not present

## 2023-01-13 DIAGNOSIS — M79662 Pain in left lower leg: Secondary | ICD-10-CM | POA: Diagnosis not present

## 2023-01-13 DIAGNOSIS — I2510 Atherosclerotic heart disease of native coronary artery without angina pectoris: Secondary | ICD-10-CM | POA: Diagnosis not present

## 2023-01-13 DIAGNOSIS — I10 Essential (primary) hypertension: Secondary | ICD-10-CM | POA: Diagnosis not present

## 2023-01-13 DIAGNOSIS — K219 Gastro-esophageal reflux disease without esophagitis: Secondary | ICD-10-CM | POA: Diagnosis not present

## 2023-01-13 DIAGNOSIS — M4800 Spinal stenosis, site unspecified: Secondary | ICD-10-CM | POA: Diagnosis not present

## 2023-01-13 DIAGNOSIS — G8929 Other chronic pain: Secondary | ICD-10-CM | POA: Diagnosis not present

## 2023-01-13 DIAGNOSIS — E785 Hyperlipidemia, unspecified: Secondary | ICD-10-CM | POA: Diagnosis not present

## 2023-01-13 DIAGNOSIS — Z7901 Long term (current) use of anticoagulants: Secondary | ICD-10-CM | POA: Diagnosis not present

## 2023-01-13 DIAGNOSIS — Z86718 Personal history of other venous thrombosis and embolism: Secondary | ICD-10-CM | POA: Diagnosis not present

## 2023-01-13 DIAGNOSIS — I2699 Other pulmonary embolism without acute cor pulmonale: Secondary | ICD-10-CM | POA: Diagnosis not present

## 2023-01-13 DIAGNOSIS — G4730 Sleep apnea, unspecified: Secondary | ICD-10-CM | POA: Diagnosis not present

## 2023-01-13 DIAGNOSIS — N179 Acute kidney failure, unspecified: Secondary | ICD-10-CM | POA: Diagnosis not present

## 2023-01-13 DIAGNOSIS — I447 Left bundle-branch block, unspecified: Secondary | ICD-10-CM | POA: Diagnosis not present

## 2023-01-19 DIAGNOSIS — I2699 Other pulmonary embolism without acute cor pulmonale: Secondary | ICD-10-CM | POA: Diagnosis not present

## 2023-01-22 DIAGNOSIS — G8929 Other chronic pain: Secondary | ICD-10-CM | POA: Diagnosis not present

## 2023-01-22 DIAGNOSIS — E785 Hyperlipidemia, unspecified: Secondary | ICD-10-CM | POA: Diagnosis not present

## 2023-01-22 DIAGNOSIS — I2510 Atherosclerotic heart disease of native coronary artery without angina pectoris: Secondary | ICD-10-CM | POA: Diagnosis not present

## 2023-01-22 DIAGNOSIS — G4730 Sleep apnea, unspecified: Secondary | ICD-10-CM | POA: Diagnosis not present

## 2023-01-22 DIAGNOSIS — M79662 Pain in left lower leg: Secondary | ICD-10-CM | POA: Diagnosis not present

## 2023-01-22 DIAGNOSIS — I10 Essential (primary) hypertension: Secondary | ICD-10-CM | POA: Diagnosis not present

## 2023-01-22 DIAGNOSIS — I2699 Other pulmonary embolism without acute cor pulmonale: Secondary | ICD-10-CM | POA: Diagnosis not present

## 2023-01-22 DIAGNOSIS — N179 Acute kidney failure, unspecified: Secondary | ICD-10-CM | POA: Diagnosis not present

## 2023-01-22 DIAGNOSIS — E559 Vitamin D deficiency, unspecified: Secondary | ICD-10-CM | POA: Diagnosis not present

## 2023-01-22 DIAGNOSIS — Z7901 Long term (current) use of anticoagulants: Secondary | ICD-10-CM | POA: Diagnosis not present

## 2023-01-22 DIAGNOSIS — M4800 Spinal stenosis, site unspecified: Secondary | ICD-10-CM | POA: Diagnosis not present

## 2023-01-22 DIAGNOSIS — Z86718 Personal history of other venous thrombosis and embolism: Secondary | ICD-10-CM | POA: Diagnosis not present

## 2023-01-22 DIAGNOSIS — I447 Left bundle-branch block, unspecified: Secondary | ICD-10-CM | POA: Diagnosis not present

## 2023-01-22 DIAGNOSIS — K219 Gastro-esophageal reflux disease without esophagitis: Secondary | ICD-10-CM | POA: Diagnosis not present

## 2023-01-27 ENCOUNTER — Ambulatory Visit (INDEPENDENT_AMBULATORY_CARE_PROVIDER_SITE_OTHER): Payer: Medicare Other | Admitting: Family Medicine

## 2023-01-29 DIAGNOSIS — I10 Essential (primary) hypertension: Secondary | ICD-10-CM | POA: Diagnosis not present

## 2023-01-29 DIAGNOSIS — I447 Left bundle-branch block, unspecified: Secondary | ICD-10-CM | POA: Diagnosis not present

## 2023-01-29 DIAGNOSIS — N179 Acute kidney failure, unspecified: Secondary | ICD-10-CM | POA: Diagnosis not present

## 2023-01-29 DIAGNOSIS — K219 Gastro-esophageal reflux disease without esophagitis: Secondary | ICD-10-CM | POA: Diagnosis not present

## 2023-01-29 DIAGNOSIS — I2510 Atherosclerotic heart disease of native coronary artery without angina pectoris: Secondary | ICD-10-CM | POA: Diagnosis not present

## 2023-01-29 DIAGNOSIS — I2699 Other pulmonary embolism without acute cor pulmonale: Secondary | ICD-10-CM | POA: Diagnosis not present

## 2023-01-29 DIAGNOSIS — E559 Vitamin D deficiency, unspecified: Secondary | ICD-10-CM | POA: Diagnosis not present

## 2023-01-29 DIAGNOSIS — E785 Hyperlipidemia, unspecified: Secondary | ICD-10-CM | POA: Diagnosis not present

## 2023-01-29 DIAGNOSIS — M79662 Pain in left lower leg: Secondary | ICD-10-CM | POA: Diagnosis not present

## 2023-01-29 DIAGNOSIS — G8929 Other chronic pain: Secondary | ICD-10-CM | POA: Diagnosis not present

## 2023-01-29 DIAGNOSIS — M4800 Spinal stenosis, site unspecified: Secondary | ICD-10-CM | POA: Diagnosis not present

## 2023-01-29 DIAGNOSIS — G4730 Sleep apnea, unspecified: Secondary | ICD-10-CM | POA: Diagnosis not present

## 2023-01-29 DIAGNOSIS — Z86718 Personal history of other venous thrombosis and embolism: Secondary | ICD-10-CM | POA: Diagnosis not present

## 2023-01-29 DIAGNOSIS — Z7901 Long term (current) use of anticoagulants: Secondary | ICD-10-CM | POA: Diagnosis not present

## 2023-02-06 DIAGNOSIS — I447 Left bundle-branch block, unspecified: Secondary | ICD-10-CM | POA: Diagnosis not present

## 2023-02-06 DIAGNOSIS — M79662 Pain in left lower leg: Secondary | ICD-10-CM | POA: Diagnosis not present

## 2023-02-06 DIAGNOSIS — M4800 Spinal stenosis, site unspecified: Secondary | ICD-10-CM | POA: Diagnosis not present

## 2023-02-06 DIAGNOSIS — I10 Essential (primary) hypertension: Secondary | ICD-10-CM | POA: Diagnosis not present

## 2023-02-06 DIAGNOSIS — N179 Acute kidney failure, unspecified: Secondary | ICD-10-CM | POA: Diagnosis not present

## 2023-02-06 DIAGNOSIS — Z86718 Personal history of other venous thrombosis and embolism: Secondary | ICD-10-CM | POA: Diagnosis not present

## 2023-02-06 DIAGNOSIS — E785 Hyperlipidemia, unspecified: Secondary | ICD-10-CM | POA: Diagnosis not present

## 2023-02-06 DIAGNOSIS — G8929 Other chronic pain: Secondary | ICD-10-CM | POA: Diagnosis not present

## 2023-02-06 DIAGNOSIS — E559 Vitamin D deficiency, unspecified: Secondary | ICD-10-CM | POA: Diagnosis not present

## 2023-02-06 DIAGNOSIS — Z7901 Long term (current) use of anticoagulants: Secondary | ICD-10-CM | POA: Diagnosis not present

## 2023-02-06 DIAGNOSIS — I2699 Other pulmonary embolism without acute cor pulmonale: Secondary | ICD-10-CM | POA: Diagnosis not present

## 2023-02-06 DIAGNOSIS — I2510 Atherosclerotic heart disease of native coronary artery without angina pectoris: Secondary | ICD-10-CM | POA: Diagnosis not present

## 2023-02-06 DIAGNOSIS — G4730 Sleep apnea, unspecified: Secondary | ICD-10-CM | POA: Diagnosis not present

## 2023-02-06 DIAGNOSIS — K219 Gastro-esophageal reflux disease without esophagitis: Secondary | ICD-10-CM | POA: Diagnosis not present

## 2023-02-13 ENCOUNTER — Encounter (HOSPITAL_BASED_OUTPATIENT_CLINIC_OR_DEPARTMENT_OTHER): Payer: Self-pay | Admitting: Family

## 2023-02-13 ENCOUNTER — Ambulatory Visit (HOSPITAL_BASED_OUTPATIENT_CLINIC_OR_DEPARTMENT_OTHER): Payer: Medicare Other | Admitting: Family

## 2023-02-13 ENCOUNTER — Telehealth: Payer: Self-pay

## 2023-02-13 VITALS — BP 126/68 | HR 76 | Ht 65.0 in | Wt 262.3 lb

## 2023-02-13 DIAGNOSIS — I25118 Atherosclerotic heart disease of native coronary artery with other forms of angina pectoris: Secondary | ICD-10-CM

## 2023-02-13 DIAGNOSIS — I10 Essential (primary) hypertension: Secondary | ICD-10-CM

## 2023-02-13 DIAGNOSIS — R600 Localized edema: Secondary | ICD-10-CM | POA: Diagnosis not present

## 2023-02-13 DIAGNOSIS — E785 Hyperlipidemia, unspecified: Secondary | ICD-10-CM

## 2023-02-13 DIAGNOSIS — Z6841 Body Mass Index (BMI) 40.0 and over, adult: Secondary | ICD-10-CM

## 2023-02-13 MED ORDER — FUROSEMIDE 20 MG PO TABS: 20.0000 mg | ORAL_TABLET | ORAL | Status: DC

## 2023-02-13 NOTE — Telephone Encounter (Signed)
 Called to discuss re-starting PREP at [facility], she has appointments/follow up with cardiology/PCP this week; if cleared and she is ready asked to let one of the know so they can send another referral for the program.

## 2023-02-13 NOTE — Patient Instructions (Addendum)
 Medication Instructions:  Your physician has recommended you make the following change in your medication:  START Furosemide  (Lasix ) 20mg  three times per weeks on Mondays, Wednesdays, Fridays  *If you need a refill on your cardiac medications before your next appointment, please call your pharmacy*   Lab Work: Your physician recommends that you return for lab work in 2 weeks for [facility]  Please return for Lab work. You may come to the...   [facility] (3rd floor) [address] [place], [place] [address]  Open: 8am-Noon and 1pm-4:30pm  Please ring the doorbell on the small table when you exit the elevator and the Lab Tech will come get you  [facility] at [facility] [address], [place], [place] [address] Open: 8am-1pm, then 2pm-4:30pm   [facility]- Please see attached locations sheet stapled to your lab work with address and hours.    If you have labs (blood work) drawn today and your tests are completely normal, you will receive your results only by: MyChart Message (if you have MyChart) OR A paper copy in the mail If you have any lab test that is abnormal or we need to change your treatment, we will call you to review the results.   Follow-Up: At [facility], you and your health needs are our priority.  As part of our continuing mission to provide you with exceptional heart care, we have created designated Provider Care Teams.  These Care Teams include your primary Cardiologist (physician) and Advanced Practice Providers (APPs -  Physician Assistants and Nurse Practitioners) who all work together to provide you with the care you need, when you need it.  We recommend signing up for the patient portal called MyChart.  Sign up information is provided on this After Visit Summary.  MyChart is used to connect with patients for Virtual Visits (Telemedicine).  Patients are able to view lab/test results, encounter notes, upcoming  appointments, etc.  Non-urgent messages can be sent to your provider as well.   To learn more about what you can do with MyChart, go to [email or web address].    Your next appointment:   3-4 month(s)  Provider:   K. Chad Hilty, MD or Reche Finder, NP    Other Instructions  Okay to resume PREP program, we have sent a referral.   To prevent or reduce lower extremity swelling: Eat a low salt diet. Salt makes the body hold onto extra fluid which causes swelling. Sit with legs elevated. For example, in the recliner or on an ottoman.  Wear knee-high compression stockings during the daytime. Ones labeled 15-20 mmHg provide good compression.

## 2023-02-13 NOTE — Progress Notes (Signed)
 Cardiology Office Note:  .   Date:  02/13/2023  ID:  [name], DOB October 14, 1945, MRN [id number] PCP: Charlott Dorn LABOR, MD  [facility] Providers Cardiologist:  None    History of Present Illness: .   [name] is a 78 y.o. female with hx of nonobstructive coronary artery disease, HLD, HTN, CKD 3a, LE edema, PE.  Coronary CTA 04/2017 chronic calcium  score of 6 placing her in the 45th percentile for age and sex matched control with minimal nonobstructive CAD in the proximal LAD.  Echo 04/2017 LVEF 55 to 60%, mild LVH, grade 1 diastolic dysfunction, trivial MR, mild TR.  Seen 11/11/22 and referred to PREP exercise program. Rosuvastatin  20mg  daily initiated due to nonobstructive CAD.   Admitted 11/28 - 01/12/2023 after presenting with chest pain, dyspnea. CTA chest with bialteral PE without heart strain. Referred to hematology. Echo normal LVEF, normal RV. Was abe to wean off O2. LE dopplers negative. Lisinopril  and Lasix  held due to stable BP - Coreg , Norvasc  continued. Discharged on Eliquis .  Presents today for follow up independently. Tolerating Eliquis  without issue with only minimal bruising. Multiple family members with blood clotting issues requiring anticoagulation. Reports no significant exertional dyspnea. Does note more weight gain and swelling with being off of Lasix . Her blood pressure athome has been 120s/60s.   ROS: Please see the history of present illness.    All other systems reviewed and are negative.   Studies Reviewed: .        Cardiac Studies & Procedures     STRESS TESTS  NM MYOCAR MULTI W/SPECT W 06/18/2014  Narrative CLINICAL DATA:  Chest pain, hypertension, shortness of breath, history of rapid heart rate per patient  EXAM: MYOCARDIAL IMAGING WITH SPECT (REST AND PHARMACOLOGIC-STRESS)  GATED LEFT VENTRICULAR WALL MOTION STUDY  LEFT VENTRICULAR EJECTION FRACTION  TECHNIQUE: Standard myocardial SPECT imaging was performed after  resting intravenous injection of 10 mCi Tc-82m sestamibi. Subsequently, intravenous infusion of Lexiscan  was performed under the supervision of the Cardiology staff. At peak effect of the drug, 30 mCi Tc-18m sestamibi was injected intravenously and standard myocardial SPECT imaging was performed. Quantitative gated imaging was also performed to evaluate left ventricular wall motion, and estimate left ventricular ejection fraction.  COMPARISON:  None  FINDINGS: Perfusion: Small focus of non reversible decreased myocardial perfusion at the anteroseptal portion of LEFT ventricular apex, potentially related to breast attenuation. No additional myocardial perfusion defects.  Wall Motion: Normal left ventricular wall motion. No left ventricular dilation.  Left Ventricular Ejection Fraction: 65 %  End diastolic volume 68 ml  End systolic volume 24 ml  IMPRESSION: 1. Small non reversible area of decreased myocardial perfusion at the anteroseptal portion of LEFT ventricular apex, potentially representing a breast attenuation artifact. No other perfusion defects identified.  2. Normal left ventricular wall motion.  3. Left ventricular ejection fraction 65%  4. Low-risk stress test findings*.  *2012 Appropriate Use Criteria for Coronary Revascularization Focused Update: J Am Coll Cardiol. 2012;59(9):857-881. http://content.dementiazones.com.aspx?articleid=1201161   Electronically Signed By: Oneil Kiss M.D. On: 06/18/2014 12:36  ECHOCARDIOGRAM  ECHOCARDIOGRAM COMPLETE 01/11/2023  Narrative ECHOCARDIOGRAM REPORT    Patient Name:   [name] Date of Exam: 01/11/2023 Medical Rec #:  [id number]      Height:       65.0 in Accession #:    [id number]     Weight:       256.6 lb Date of Birth:  [birth year]  BSA:          2.199 m Patient Age:    77 years       BP:           135/54 mmHg Patient Gender: F              HR:           65 bpm. Exam Location:   Inpatient  Procedure: 2D Echo, Color Doppler, Cardiac Doppler and Intracardiac Opacification Agent  Indications:    I26.02 Pulmonary embolus  History:        Patient has prior history of Echocardiogram examinations, most recent 04/21/2017. Risk Factors:Hypertension, Dyslipidemia and Sleep Apnea.  Sonographer:    Damien Senior RDCS Referring Phys: [id number] STEPHEN K CHIU  IMPRESSIONS   1. Left ventricular ejection fraction, by estimation, is 60 to 65%. The left ventricle has normal function. The left ventricle has no regional wall motion abnormalities. Left ventricular diastolic parameters are consistent with Grade I diastolic dysfunction (impaired relaxation). 2. Right ventricular systolic function is normal. The right ventricular size is normal. There is normal pulmonary artery systolic pressure. The estimated right ventricular systolic pressure is 35.5 mmHg. 3. The mitral valve is grossly normal. Trivial mitral valve regurgitation. No evidence of mitral stenosis. 4. The aortic valve is grossly normal. Aortic valve regurgitation is not visualized. No aortic stenosis is present. 5. The inferior vena cava is normal in size with greater than 50% respiratory variability, suggesting right atrial pressure of 3 mmHg.  FINDINGS Left Ventricle: Left ventricular ejection fraction, by estimation, is 60 to 65%. The left ventricle has normal function. The left ventricle has no regional wall motion abnormalities. The left ventricular internal cavity size was normal in size. There is no left ventricular hypertrophy. Left ventricular diastolic parameters are consistent with Grade I diastolic dysfunction (impaired relaxation).  Right Ventricle: The right ventricular size is normal. No increase in right ventricular wall thickness. Right ventricular systolic function is normal. There is normal pulmonary artery systolic pressure. The tricuspid regurgitant velocity is 2.85 m/s, and with an assumed right atrial  pressure of 3 mmHg, the estimated right ventricular systolic pressure is 35.5 mmHg.  Left Atrium: Left atrial size was normal in size.  Right Atrium: Right atrial size was normal in size.  Pericardium: There is no evidence of pericardial effusion.  Mitral Valve: The mitral valve is grossly normal. Trivial mitral valve regurgitation. No evidence of mitral valve stenosis.  Tricuspid Valve: The tricuspid valve is grossly normal. Tricuspid valve regurgitation is trivial. No evidence of tricuspid stenosis.  Aortic Valve: The aortic valve is grossly normal. Aortic valve regurgitation is not visualized. No aortic stenosis is present.  Pulmonic Valve: The pulmonic valve was grossly normal. Pulmonic valve regurgitation is trivial. No evidence of pulmonic stenosis.  Aorta: The aortic root and ascending aorta are structurally normal, with no evidence of dilitation.  Venous: The inferior vena cava is normal in size with greater than 50% respiratory variability, suggesting right atrial pressure of 3 mmHg.  IAS/Shunts: The atrial septum is grossly normal.   LEFT VENTRICLE PLAX 2D LVIDd:         3.50 cm   Diastology LVIDs:         2.40 cm   LV e' medial:    6.96 cm/s LV PW:         1.00 cm   LV E/e' medial:  7.9 LV IVS:        1.10 cm   LV  e' lateral:   7.94 cm/s LVOT diam:     1.80 cm   LV E/e' lateral: 7.0 LV SV:         69 LV SV Index:   31 LVOT Area:     2.54 cm   RIGHT VENTRICLE RV S prime:     13.40 cm/s TAPSE (M-mode): 2.3 cm  LEFT ATRIUM             Index        RIGHT ATRIUM           Index LA diam:        3.20 cm 1.46 cm/m   RA Area:     22.10 cm LA Vol (A2C):   45.1 ml 20.51 ml/m  RA Volume:   60.70 ml  27.60 ml/m LA Vol (A4C):   46.9 ml 21.33 ml/m LA Biplane Vol: 45.6 ml 20.73 ml/m AORTIC VALVE LVOT Vmax:   128.00 cm/s LVOT Vmean:  94.900 cm/s LVOT VTI:    0.272 m  AORTA Ao Root diam: 3.10 cm Ao Asc diam:  3.20 cm  MITRAL VALVE               TRICUSPID VALVE MV  Area (PHT): 2.09 cm    TR Peak grad:   32.5 mmHg MV Decel Time: 363 msec    TR Vmax:        285.00 cm/s MV E velocity: 55.30 cm/s MV A velocity: 68.60 cm/s  SHUNTS MV E/A ratio:  0.81        Systemic VTI:  0.27 m Systemic Diam: 1.80 cm  Darryle Decent MD Electronically signed by Darryle Decent MD Signature Date/Time: 01/11/2023/3:08:57 PM    Final    CT SCANS  CT CORONARY MORPH W/CTA COR W/SCORE 04/20/2017  Addendum 04/20/2017  6:01 PM ADDENDUM REPORT: 04/20/2017 17:59  CLINICAL DATA:  78 year old female with chest pain.  EXAM: Cardiac/Coronary  CT  TECHNIQUE: The patient was scanned on a [employer].  FINDINGS: A 120 kV prospective scan was triggered in the descending thoracic aorta at 111 HU's. Axial non-contrast 3 mm slices were carried out through the heart. The data set was analyzed on a dedicated work station and scored using the Agatson method. Gantry rotation speed was 250 msecs and collimation was .6 mm. 10 mg of iv Metoprolol  and 0.8 mg of sl NTG was given. The 3D data set was reconstructed in 5% intervals of the 67-82 % of the R-R cycle. Diastolic phases were analyzed on a dedicated work station using MPR, MIP and VRT modes. The patient received 80 cc of contrast.  Aorta:  Normal size.  No calcifications.  No dissection.  Aortic Valve:  Trileaflet.  No calcifications.  Coronary Arteries:  Normal coronary origin.  Left dominance.  Left main is a large artery that gives rise to LAD and LCX arteries. LM has no plaque.  LAD is a large and long artery that gives rise to two small diagonal arteries, wraps around the apex and partially supplies PDA territory. There is minimal calcified plaque in the proximal segment with associated stenosis 0-25%.  LCX is a large dominant artery that gives rise to one large OM1 branch, PLA and a small PDA. There is no plaque.  RCA is a small non-dominant artery.  Other findings:  Normal pulmonary vein drainage  into the left atrium.  Normal let atrial appendage without a thrombus.  Normal size of the pulmonary artery.  IMPRESSION: 1. Coronary calcium  score  of 6. This was 45 percentile for age and sex matched control.  2. Normal coronary origin with right dominance.  3. 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   Electronically Signed By: Leim Moose On: 04/20/2017 17:59  Narrative EXAM: OVER-READ INTERPRETATION  CT CHEST  The following report is an over-read performed by radiologist Dr. Jackquline Boxer of [facility] on 04/20/2017. This over-read does not include interpretation of cardiac or coronary anatomy or pathology. The coronary calcium  score/coronary CTA interpretation by the cardiologist is attached.  COMPARISON:  None.  FINDINGS: Limited view of the lung parenchyma demonstrates no suspicious nodularity. Airways are normal.  Limited view of the mediastinum demonstrates no adenopathy. Esophagus normal.  Limited view of the upper abdomen unremarkable.  Limited view of the skeleton and chest wall is unremarkable.  IMPRESSION: No significant extracardiac findings  Electronically Signed: By: Jackquline Boxer M.D. On: 04/20/2017 15:52            Risk Assessment/Calculations:             Physical Exam:   VS:  BP 126/68   Pulse 76   Ht 5' 5 (1.651 m)   Wt 262 lb 4.8 oz (119 kg)   SpO2 96%   BMI 43.65 kg/m    Wt Readings from Last 3 Encounters:  02/13/23 262 lb 4.8 oz (119 kg)  01/09/23 256 lb 9.9 oz (116.4 kg)  01/06/23 257 lb (116.6 kg)    GEN: Well nourished, overweight, well developed in no acute distress NECK: No JVD; No carotid bruits CARDIAC: RRR, no murmurs, rubs, gallops RESPIRATORY:  Clear to auscultation without rales, wheezing or rhonchi  ABDOMEN: Soft, non-tender, non-distended EXTREMITIES:  Nonpitting bilateral LE edema; No deformity   ASSESSMENT AND PLAN: .    Nonobstructive CAD Stable with no anginal  symptoms. No indication for ischemic evaluation.  GDMT Aspirin , Rosuvastatin . .  Recommend aiming for 150 minutes of moderate intensity activity per week and following a heart healthy diet. Resume PREP exercise program.  Hx of PE / Hypercoagulable state - Upcoming visit with hematology for hypercoagulable workup. Denies bleeding complications on Eliquis  5mg  BID.   HLD, LDL goal <70 - 12/16/22 LDL 139. Was nott aking her Rosuvastatin , has since resumed. 01/12/23 AST 25, ALT 16.Coordinate lipid panel at follow up.  LE edema - Increased since being off Lasix . Previously on 20mg  BID. Will resume at lower dose 20mg  three times per week on M/W/F. Encouraged leg elevation, compression. BMP in 2 weeks.   HTN- BP well controlled. Continue current antihypertensive regimen.    Obesity- Weight loss via diet and exercise encouraged. Discussed the impact being overweight would have on cardiovascular risk. Referred to PREP exercise program.   CKD 3A- Careful titration of diuretic and antihypertensive.  Continue to follow with PCP.         Dispo: follow up in 3-4 months  Signed, Reche GORMAN Finder, NP

## 2023-02-14 DIAGNOSIS — G4730 Sleep apnea, unspecified: Secondary | ICD-10-CM | POA: Diagnosis not present

## 2023-02-14 DIAGNOSIS — I447 Left bundle-branch block, unspecified: Secondary | ICD-10-CM | POA: Diagnosis not present

## 2023-02-14 DIAGNOSIS — G8929 Other chronic pain: Secondary | ICD-10-CM | POA: Diagnosis not present

## 2023-02-14 DIAGNOSIS — I2699 Other pulmonary embolism without acute cor pulmonale: Secondary | ICD-10-CM | POA: Diagnosis not present

## 2023-02-14 DIAGNOSIS — K219 Gastro-esophageal reflux disease without esophagitis: Secondary | ICD-10-CM | POA: Diagnosis not present

## 2023-02-14 DIAGNOSIS — E785 Hyperlipidemia, unspecified: Secondary | ICD-10-CM | POA: Diagnosis not present

## 2023-02-14 DIAGNOSIS — Z7901 Long term (current) use of anticoagulants: Secondary | ICD-10-CM | POA: Diagnosis not present

## 2023-02-14 DIAGNOSIS — N179 Acute kidney failure, unspecified: Secondary | ICD-10-CM | POA: Diagnosis not present

## 2023-02-14 DIAGNOSIS — E559 Vitamin D deficiency, unspecified: Secondary | ICD-10-CM | POA: Diagnosis not present

## 2023-02-14 DIAGNOSIS — Z86718 Personal history of other venous thrombosis and embolism: Secondary | ICD-10-CM | POA: Diagnosis not present

## 2023-02-14 DIAGNOSIS — M4800 Spinal stenosis, site unspecified: Secondary | ICD-10-CM | POA: Diagnosis not present

## 2023-02-14 DIAGNOSIS — I2510 Atherosclerotic heart disease of native coronary artery without angina pectoris: Secondary | ICD-10-CM | POA: Diagnosis not present

## 2023-02-14 DIAGNOSIS — M79662 Pain in left lower leg: Secondary | ICD-10-CM | POA: Diagnosis not present

## 2023-02-14 DIAGNOSIS — I10 Essential (primary) hypertension: Secondary | ICD-10-CM | POA: Diagnosis not present

## 2023-02-16 ENCOUNTER — Encounter: Payer: Medicare Other | Admitting: Hematology and Oncology

## 2023-02-16 ENCOUNTER — Other Ambulatory Visit: Payer: Medicare Other

## 2023-02-17 DIAGNOSIS — I2510 Atherosclerotic heart disease of native coronary artery without angina pectoris: Secondary | ICD-10-CM | POA: Diagnosis not present

## 2023-02-17 DIAGNOSIS — Z7901 Long term (current) use of anticoagulants: Secondary | ICD-10-CM | POA: Diagnosis not present

## 2023-02-17 DIAGNOSIS — I447 Left bundle-branch block, unspecified: Secondary | ICD-10-CM | POA: Diagnosis not present

## 2023-02-17 DIAGNOSIS — E785 Hyperlipidemia, unspecified: Secondary | ICD-10-CM | POA: Diagnosis not present

## 2023-02-17 DIAGNOSIS — G8929 Other chronic pain: Secondary | ICD-10-CM | POA: Diagnosis not present

## 2023-02-17 DIAGNOSIS — K219 Gastro-esophageal reflux disease without esophagitis: Secondary | ICD-10-CM | POA: Diagnosis not present

## 2023-02-17 DIAGNOSIS — M4800 Spinal stenosis, site unspecified: Secondary | ICD-10-CM | POA: Diagnosis not present

## 2023-02-17 DIAGNOSIS — E559 Vitamin D deficiency, unspecified: Secondary | ICD-10-CM | POA: Diagnosis not present

## 2023-02-17 DIAGNOSIS — I2699 Other pulmonary embolism without acute cor pulmonale: Secondary | ICD-10-CM | POA: Diagnosis not present

## 2023-02-17 DIAGNOSIS — N179 Acute kidney failure, unspecified: Secondary | ICD-10-CM | POA: Diagnosis not present

## 2023-02-17 DIAGNOSIS — G4730 Sleep apnea, unspecified: Secondary | ICD-10-CM | POA: Diagnosis not present

## 2023-02-17 DIAGNOSIS — Z86718 Personal history of other venous thrombosis and embolism: Secondary | ICD-10-CM | POA: Diagnosis not present

## 2023-02-17 DIAGNOSIS — I10 Essential (primary) hypertension: Secondary | ICD-10-CM | POA: Diagnosis not present

## 2023-02-17 DIAGNOSIS — M79662 Pain in left lower leg: Secondary | ICD-10-CM | POA: Diagnosis not present

## 2023-02-18 ENCOUNTER — Telehealth: Payer: Self-pay

## 2023-02-18 DIAGNOSIS — R7303 Prediabetes: Secondary | ICD-10-CM | POA: Diagnosis not present

## 2023-02-18 DIAGNOSIS — I10 Essential (primary) hypertension: Secondary | ICD-10-CM | POA: Diagnosis not present

## 2023-02-18 DIAGNOSIS — I471 Supraventricular tachycardia, unspecified: Secondary | ICD-10-CM | POA: Diagnosis not present

## 2023-02-18 DIAGNOSIS — I2699 Other pulmonary embolism without acute cor pulmonale: Secondary | ICD-10-CM | POA: Diagnosis not present

## 2023-02-18 DIAGNOSIS — K219 Gastro-esophageal reflux disease without esophagitis: Secondary | ICD-10-CM | POA: Diagnosis not present

## 2023-02-18 DIAGNOSIS — E559 Vitamin D deficiency, unspecified: Secondary | ICD-10-CM | POA: Diagnosis not present

## 2023-02-18 DIAGNOSIS — E7800 Pure hypercholesterolemia, unspecified: Secondary | ICD-10-CM | POA: Diagnosis not present

## 2023-02-18 DIAGNOSIS — M8580 Other specified disorders of bone density and structure, unspecified site: Secondary | ICD-10-CM | POA: Diagnosis not present

## 2023-02-18 DIAGNOSIS — I2510 Atherosclerotic heart disease of native coronary artery without angina pectoris: Secondary | ICD-10-CM | POA: Diagnosis not present

## 2023-02-18 DIAGNOSIS — N1831 Chronic kidney disease, stage 3a: Secondary | ICD-10-CM | POA: Diagnosis not present

## 2023-02-18 DIAGNOSIS — Z0000 Encounter for general adult medical examination without abnormal findings: Secondary | ICD-10-CM | POA: Diagnosis not present

## 2023-02-18 DIAGNOSIS — Z79899 Other long term (current) drug therapy: Secondary | ICD-10-CM | POA: Diagnosis not present

## 2023-02-18 DIAGNOSIS — Z23 Encounter for immunization: Secondary | ICD-10-CM | POA: Diagnosis not present

## 2023-02-18 LAB — LIPID PANEL
HDL: 41 (ref 35–70)
LDL Cholesterol: 57
Triglycerides: 74 (ref 40–160)

## 2023-02-18 LAB — BASIC METABOLIC PANEL
BUN: 12 (ref 4–21)
CO2: 27 — AB (ref 13–22)
Chloride: 106 (ref 99–108)
Creatinine: 1.1 (ref 0.5–1.1)
Glucose: 106
Potassium: 4.4 meq/L (ref 3.5–5.1)
Sodium: 141 (ref 137–147)

## 2023-02-18 LAB — HEMOGLOBIN A1C: Hemoglobin A1C: 6.2

## 2023-02-18 LAB — HEPATIC FUNCTION PANEL
ALT: 15 U/L (ref 7–35)
AST: 15 (ref 13–35)
Alkaline Phosphatase: 63 (ref 25–125)
Bilirubin, Total: 0.4

## 2023-02-18 LAB — CBC AND DIFFERENTIAL
HCT: 41 (ref 36–46)
Hemoglobin: 14.1 (ref 12.0–16.0)
Platelets: 336 10*3/uL (ref 150–400)
WBC: 5.7

## 2023-02-18 LAB — CBC: RBC: 4.35 (ref 3.87–5.11)

## 2023-02-18 LAB — VITAMIN D 25 HYDROXY (VIT D DEFICIENCY, FRACTURES): Vit D, 25-Hydroxy: 20.9

## 2023-02-18 LAB — COMPREHENSIVE METABOLIC PANEL
Albumin: 4.1 (ref 3.5–5.0)
Calcium: 9.6 (ref 8.7–10.7)

## 2023-02-18 LAB — TSH: TSH: 1.87 (ref 0.41–5.90)

## 2023-02-18 NOTE — Telephone Encounter (Signed)
 Called her re: restarting PREP program, she would like to attend 1/27 class at [facility], every M/W 12:30-1:45; assessment visit scheduled for 1/22 at 1:30

## 2023-02-24 DIAGNOSIS — I2699 Other pulmonary embolism without acute cor pulmonale: Secondary | ICD-10-CM | POA: Diagnosis not present

## 2023-02-24 DIAGNOSIS — M4800 Spinal stenosis, site unspecified: Secondary | ICD-10-CM | POA: Diagnosis not present

## 2023-02-24 DIAGNOSIS — E785 Hyperlipidemia, unspecified: Secondary | ICD-10-CM | POA: Diagnosis not present

## 2023-02-24 DIAGNOSIS — N179 Acute kidney failure, unspecified: Secondary | ICD-10-CM | POA: Diagnosis not present

## 2023-02-24 DIAGNOSIS — I2510 Atherosclerotic heart disease of native coronary artery without angina pectoris: Secondary | ICD-10-CM | POA: Diagnosis not present

## 2023-02-24 DIAGNOSIS — I10 Essential (primary) hypertension: Secondary | ICD-10-CM | POA: Diagnosis not present

## 2023-02-24 DIAGNOSIS — E559 Vitamin D deficiency, unspecified: Secondary | ICD-10-CM | POA: Diagnosis not present

## 2023-02-24 DIAGNOSIS — I447 Left bundle-branch block, unspecified: Secondary | ICD-10-CM | POA: Diagnosis not present

## 2023-02-24 DIAGNOSIS — G4730 Sleep apnea, unspecified: Secondary | ICD-10-CM | POA: Diagnosis not present

## 2023-02-24 DIAGNOSIS — Z86718 Personal history of other venous thrombosis and embolism: Secondary | ICD-10-CM | POA: Diagnosis not present

## 2023-02-24 DIAGNOSIS — K219 Gastro-esophageal reflux disease without esophagitis: Secondary | ICD-10-CM | POA: Diagnosis not present

## 2023-02-24 DIAGNOSIS — Z7901 Long term (current) use of anticoagulants: Secondary | ICD-10-CM | POA: Diagnosis not present

## 2023-02-24 DIAGNOSIS — M79662 Pain in left lower leg: Secondary | ICD-10-CM | POA: Diagnosis not present

## 2023-02-24 DIAGNOSIS — G8929 Other chronic pain: Secondary | ICD-10-CM | POA: Diagnosis not present

## 2023-03-02 ENCOUNTER — Ambulatory Visit (INDEPENDENT_AMBULATORY_CARE_PROVIDER_SITE_OTHER): Payer: Medicare Other | Admitting: Family Medicine

## 2023-03-04 DIAGNOSIS — I2699 Other pulmonary embolism without acute cor pulmonale: Secondary | ICD-10-CM | POA: Diagnosis not present

## 2023-03-04 DIAGNOSIS — Z86718 Personal history of other venous thrombosis and embolism: Secondary | ICD-10-CM | POA: Diagnosis not present

## 2023-03-04 DIAGNOSIS — E785 Hyperlipidemia, unspecified: Secondary | ICD-10-CM | POA: Diagnosis not present

## 2023-03-04 DIAGNOSIS — N179 Acute kidney failure, unspecified: Secondary | ICD-10-CM | POA: Diagnosis not present

## 2023-03-04 DIAGNOSIS — I10 Essential (primary) hypertension: Secondary | ICD-10-CM | POA: Diagnosis not present

## 2023-03-04 DIAGNOSIS — G4730 Sleep apnea, unspecified: Secondary | ICD-10-CM | POA: Diagnosis not present

## 2023-03-04 DIAGNOSIS — Z7901 Long term (current) use of anticoagulants: Secondary | ICD-10-CM | POA: Diagnosis not present

## 2023-03-04 DIAGNOSIS — K219 Gastro-esophageal reflux disease without esophagitis: Secondary | ICD-10-CM | POA: Diagnosis not present

## 2023-03-04 DIAGNOSIS — I2510 Atherosclerotic heart disease of native coronary artery without angina pectoris: Secondary | ICD-10-CM | POA: Diagnosis not present

## 2023-03-04 DIAGNOSIS — E559 Vitamin D deficiency, unspecified: Secondary | ICD-10-CM | POA: Diagnosis not present

## 2023-03-04 DIAGNOSIS — M4800 Spinal stenosis, site unspecified: Secondary | ICD-10-CM | POA: Diagnosis not present

## 2023-03-04 DIAGNOSIS — G8929 Other chronic pain: Secondary | ICD-10-CM | POA: Diagnosis not present

## 2023-03-04 DIAGNOSIS — I447 Left bundle-branch block, unspecified: Secondary | ICD-10-CM | POA: Diagnosis not present

## 2023-03-04 DIAGNOSIS — M79662 Pain in left lower leg: Secondary | ICD-10-CM | POA: Diagnosis not present

## 2023-03-13 NOTE — Progress Notes (Signed)
Re-enrolled in PREP class at [facility] for 1/27 class; had assessment visit scheduled for 1/23, pt had to cancel; agreed to come prior to starting class on 1/27 at 12 for assessment, but had to cancel; encouraged her to come 1/29 to class and would complete paperwork then; she did not attend that class, had not returned calls.

## 2023-03-16 ENCOUNTER — Other Ambulatory Visit: Payer: Self-pay | Admitting: *Deleted

## 2023-03-16 ENCOUNTER — Inpatient Hospital Stay: Payer: Medicare Other | Attending: Hematology and Oncology | Admitting: Hematology and Oncology

## 2023-03-16 ENCOUNTER — Inpatient Hospital Stay: Payer: Medicare Other

## 2023-03-16 VITALS — BP 154/76 | HR 83 | Temp 97.9°F | Resp 18 | Ht 65.75 in | Wt 262.7 lb

## 2023-03-16 DIAGNOSIS — E785 Hyperlipidemia, unspecified: Secondary | ICD-10-CM | POA: Diagnosis not present

## 2023-03-16 DIAGNOSIS — Z79899 Other long term (current) drug therapy: Secondary | ICD-10-CM | POA: Insufficient documentation

## 2023-03-16 DIAGNOSIS — I2699 Other pulmonary embolism without acute cor pulmonale: Secondary | ICD-10-CM | POA: Insufficient documentation

## 2023-03-16 DIAGNOSIS — I10 Essential (primary) hypertension: Secondary | ICD-10-CM | POA: Diagnosis not present

## 2023-03-16 DIAGNOSIS — Z87891 Personal history of nicotine dependence: Secondary | ICD-10-CM | POA: Diagnosis not present

## 2023-03-16 LAB — CBC WITH DIFFERENTIAL/PLATELET
Abs Immature Granulocytes: 0.02 10*3/uL (ref 0.00–0.07)
Basophils Absolute: 0.1 10*3/uL (ref 0.0–0.1)
Basophils Relative: 1 %
Eosinophils Absolute: 0.2 10*3/uL (ref 0.0–0.5)
Eosinophils Relative: 3 %
HCT: 41.9 % (ref 36.0–46.0)
Hemoglobin: 14 g/dL (ref 12.0–15.0)
Immature Granulocytes: 0 %
Lymphocytes Relative: 40 %
Lymphs Abs: 2.1 10*3/uL (ref 0.7–4.0)
MCH: 31.7 pg (ref 26.0–34.0)
MCHC: 33.4 g/dL (ref 30.0–36.0)
MCV: 95 fL (ref 80.0–100.0)
Monocytes Absolute: 0.5 10*3/uL (ref 0.1–1.0)
Monocytes Relative: 9 %
Neutro Abs: 2.5 10*3/uL (ref 1.7–7.7)
Neutrophils Relative %: 47 %
Platelets: 302 10*3/uL (ref 150–400)
RBC: 4.41 MIL/uL (ref 3.87–5.11)
RDW: 13.7 % (ref 11.5–15.5)
WBC: 5.3 10*3/uL (ref 4.0–10.5)
nRBC: 0 % (ref 0.0–0.2)

## 2023-03-16 NOTE — Progress Notes (Signed)
[name]  Patient Care Team: Emilio Aspen, MD as PCP - General (Internal Medicine) Rennis Golden Lisette Abu, MD as PCP - Cardiology (Cardiology) Alver Sorrow, NP as Nurse Practitioner (Cardiology)  CHIEF COMPLAINTS/PURPOSE OF CONSULTATION:  Pulmonary embolism  ASSESSMENT & PLAN:  Pulmonary embolism without acute cor pulmonale [facility]) This is a very pleasant 59 female with newly diagnosed acute bilateral PE referred to hematology for recommendations. She is now on eliquis 5 mg PO BID and is tolerating it well. She has never had a history of DVT/PE, family history significant for blood clots in son and father. She had COVID illness prior to the clot, other than that she denies any provoking factors. We have discussed about etiology and risk factors for DVT/PE including stasis, surgery, trauma, hypercoagulable state, estrogen supplementation in females, testosterone supplementation in males etc.  We have discussed about symptoms and signs of DVT/PE and the need to go to the nearest facility with any symptoms concerning for recurrent DVT/PE.  We have discussed duration of anticoagulation which can range from 3 to 6 months after first episode and indefinite anticoagulation for recurrent episodes. Patient was informed of risk of bleeding with blood thinners on board however benefits outweigh risks at this time. Long-term side effects of the new novel anticoagulants remain unknown at this time. We will consider APLS work up, JAK 2 and PNH panel testing. Rest of the hypercoag work up deferred at this time. RTC with me end of May before we plan to discontinue anticoagulation. Age appropriate cancer screening advised.  We have discussed that underlying malignancy can also be a cause of unexplained DVT/PE however she has no concerning review of systems and she is up-to-date with age-appropriate cancer screening, no known family history of cancer except for one grandparent  with colon cancer in her 64s.  Hence I do not believe there is a need for pan CT.  Hypertension and Hyperlipidemia Managed with Norvasc and Crestor. -Continue current medications.  General Health Maintenance -Continue age-appropriate screenings. 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 -Inform any future surgeons of VTE history for perioperative anticoagulation planning.  Orders Placed This Encounter  Procedures   JAK2 (INCLUDING V617F AND EXON 12), MPL,& CALR W/RFL MPN PANEL (NGS)   PNH Profile (-High Sensitivity)    Standing Status:   Future    Expiration Date:   03/15/2024   CBC with Differential/Platelet    Standing Status:   Standing    Number of Occurrences:   22    Expiration Date:   03/15/2024   Lupus anticoagulant panel*    Standing Status:   Future    Expiration Date:   03/15/2024   Beta-2-glycoprotein i abs, IgG/M/A    Standing Status:   Future    Expiration Date:   03/15/2024   Cardiolipin antibodies, IgG, IgM, IgA*    Standing Status:   Future    Expiration Date:   03/15/2024     HISTORY OF PRESENTING ILLNESS:  [name] 78 y.o. female is here because of pulmonary embolism, acute.  Discussed the use of AI scribe software for clinical note transcription with the patient, who gave verbal consent to proceed.  History of Present Illness    The patient is a 78 year old female with a history of blood clots who presents with a recent pulmonary embolism.  In November, she experienced her first blood clot, a bilateral pulmonary embolism, preceded by leg pain and swelling. She  developed shortness of breath and chest pain, prompting her to call paramedics on Thanksgiving Day. She was initially taken to [facility] and then transferred to [facility] due to severe symptoms. The leg pain was only present on the day of the clot. There was no recent travel, injury, or new medications that could have contributed to the clot.  She has a family history of  blood clots, with her son and possibly her father having experienced them. Her son was diagnosed with blood clots in his early forties and is on lifelong anticoagulation therapy. She also had COVID-19 about Nov 2024, confirmed by testing.  She is currently on Eliquis 5 mg twice daily, Norvasc, Coreg, Lasix, Crestor, and multivitamins. She is also on a high dose of vitamin D (50,000 units weekly) for four weeks, which she started three weeks ago, and will return to a lower dose thereafter.  She experiences ongoing shortness of breath with exertion, palpitations, and occasional 'stinging' sensations in her chest, which she likens to a bee sting. No pain upon palpation of the chest, fever, night sweats, weight loss, blood in stool, or urine.  Her past medical history includes high blood pressure, high cholesterol, sleep apnea, vitamin D deficiency, arthritis, and a total hysterectomy. She had a hip replacement on the right side many years ago.  She is a retired Teacher, early years/pre, quit smoking in 2013, and does not consume alcohol.  She is up to date with age appropriate cancer screening. Rest of the pertinent 10 point ROS reviewed and neg.  MEDICAL HISTORY:  Past Medical History:  Diagnosis Date   Anxiety    Arthritis    Back pain    Chronic pain    Edema of both lower extremities    Fluid retention    Food allergy    GERD (gastroesophageal reflux disease)    Hyperlipidemia    Hypertension    Knee pain    LBBB (left bundle branch block)    Osteoarthritis    Pre-diabetes    Shoulder pain    Sleep apnea    possibly   Spinal stenosis    Vitamin D deficiency     SURGICAL HISTORY: Past Surgical History:  Procedure Laterality Date   ABDOMINAL HYSTERECTOMY  1970's   ABDOMINAL HYSTERECTOMY     APPENDECTOMY  1970's   with hysterectomy   APPENDECTOMY     with hysterectomy   COLONOSCOPY     COLONOSCOPY WITH PROPOFOL N/A 10/04/2013   Procedure: COLONOSCOPY WITH PROPOFOL;  Surgeon:  Charolett Bumpers, MD;  Location: [facility];  Service: Endoscopy;  Laterality: N/A;   OOPHORECTOMY     TOTAL HIP ARTHROPLASTY  04/04/2011   Procedure: TOTAL HIP ARTHROPLASTY ANTERIOR APPROACH;  Surgeon: [name], MD;  Location: [facility];  Service: Orthopedics;  Laterality: Right;   TOTAL HIP ARTHROPLASTY      SOCIAL HISTORY: Social History   Socioeconomic History   Marital status: Widowed    Spouse name: Not on file   Number of children: Not on file   Years of education: Not on file   Highest education level: Not on file  Occupational History   Occupation: Dialysis Tech, retired  Tobacco Use   Smoking status: Former    Current packs/day: 0.00    Average packs/day: 1 pack/day for 20.0 years (20.0 ttl pk-yrs)    Types: Cigarettes    Start date: 05/23/1991    Quit date: 05/23/2011    Years since quitting: 11.8   Smokeless  tobacco: Never  Substance and Sexual Activity   Alcohol use: No    Comment: occassional   Drug use: No   Sexual activity: Not on file  Other Topics Concern   Not on file  Social History Narrative   ** Merged History Encounter **       Social Drivers of Health   Financial Resource Strain: Not on file  Food Insecurity: No Food Insecurity (01/09/2023)   Hunger Vital Sign    Worried About Running Out of Food in the Last Year: Never true    Ran Out of Food in the Last Year: Never true  Transportation Needs: No Transportation Needs (01/09/2023)   PRAPARE - Administrator, Civil Service (Medical): No    Lack of Transportation (Non-Medical): No  Physical Activity: Not on file  Stress: Not on file  Social Connections: Not on file  Intimate Partner Violence: Not At Risk (01/09/2023)   Humiliation, Afraid, Rape, and Kick questionnaire    Fear of Current or Ex-Partner: No    Emotionally Abused: No    Physically Abused: No    Sexually Abused: No    FAMILY HISTORY: Family History  Problem Relation Age of Onset   Hyperlipidemia  Mother    Hypertension Mother    Heart disease Mother    Diabetes Mother    Obesity Mother    Kidney disease Mother    Stroke Father    Hyperlipidemia Father    Hypertension Father    Heart disease Father    Heart attack Father    High Cholesterol Father    Depression Father    Anxiety disorder Father    Alcoholism Father    Prostate cancer Brother    Heart attack Brother    Heart disease Brother     ALLERGIES:  is allergic to kiwi extract, meloxicam, and pineapple.  MEDICATIONS:  Current Outpatient Medications  Medication Sig Dispense Refill   acetaminophen (TYLENOL) 650 MG CR tablet Take 650 mg by mouth every 8 (eight) hours as needed for pain.     amLODipine (NORVASC) 5 MG tablet Take 5 mg by mouth every morning.     APIXABAN (ELIQUIS) VTE STARTER PACK (10MG  AND 5MG ) Take as directed on package: start with two-5mg  tablets twice daily for 7 days. On day 8, switch to one-5mg  tablet twice daily. 74 each 0   Ascorbic Acid (VITAMIN C) 1000 MG tablet Take 1,000 mg by mouth daily.     carvedilol (COREG) 12.5 MG tablet Take 1 tablet (12.5 mg total) by mouth 2 (two) times daily with a meal. 60 tablet 11   cholecalciferol (VITAMIN D) 25 MCG (1000 UNIT) tablet Take 1,000 Units by mouth daily.     furosemide (LASIX) 20 MG tablet Take 1 tablet (20 mg total) by mouth 3 (three) times a week. Take Mondays, Wednesdays, Fridays.     Multiple Vitamins-Minerals (CENTRUM SILVER PO) Take by mouth.     rosuvastatin (CRESTOR) 20 MG tablet Take 1 tablet (20 mg total) by mouth daily. 90 tablet 3   Vitamin D, Ergocalciferol, (DRISDOL) 1.25 MG (50000 UNIT) CAPS capsule Take 1 capsule (50,000 Units total) by mouth every 7 (seven) days. (Patient not taking: Reported on 01/09/2023) 4 capsule 0   No current facility-administered medications for this visit.     PHYSICAL EXAMINATION: ECOG PERFORMANCE STATUS: 0 - Asymptomatic  Vitals:   03/16/23 1019  BP: (!) 154/76  Pulse: 83  Resp: 18  Temp: 97.9  F (36.6  C)  SpO2: 93%   Filed Weights   03/16/23 1019  Weight: 262 lb 11.2 oz (119.2 kg)    GENERAL:alert, no distress and comfortable SKIN: skin color, texture, turgor are normal, no rashes or significant lesions EYES: normal, conjunctiva are pink and non-injected, sclera clear OROPHARYNX:no exudate, no erythema and lips, buccal mucosa, and tongue normal  NECK: supple, thyroid normal size, non-tender, without nodularity LYMPH:  no palpable lymphadenopathy in the cervical, axillary LUNGS: clear to auscultation and percussion with normal breathing effort HEART: regular rate & rhythm and no murmurs and no lower extremity edema ABDOMEN:abdomen soft, non-tender and normal bowel sounds Musculoskeletal:no cyanosis of digits and no clubbing  PSYCH: alert & oriented x 3 with fluent speech NEURO: no focal motor/sensory deficits  LABORATORY DATA:  I have reviewed the data as listed Lab Results  Component Value Date   WBC 5.4 01/12/2023   HGB 12.7 01/12/2023   HCT 37.9 01/12/2023   MCV 94.0 01/12/2023   PLT 282 01/12/2023     Chemistry      Component Value Date/Time   NA 141 01/12/2023 0622   NA 140 12/16/2022 0933   K 4.3 01/12/2023 0622   CL 105 01/12/2023 0622   CO2 25 01/12/2023 0622   BUN 13 01/12/2023 0622   BUN 11 12/16/2022 0933   CREATININE 0.93 01/12/2023 0622      Component Value Date/Time   CALCIUM 9.5 01/12/2023 0622   ALKPHOS 50 01/12/2023 0622   AST 25 01/12/2023 0622   ALT 16 01/12/2023 0622   BILITOT 0.4 01/12/2023 0622   BILITOT 0.4 12/16/2022 0933       RADIOGRAPHIC STUDIES: I have personally reviewed the radiological images as listed and agreed with the findings in the report. No results found.  All questions were answered. The patient knows to call the clinic with any problems, questions or concerns. I spent 45 minutes in the care of this patient including H and P, review of records, counseling and coordination of care.     Rachel Moulds,  MD 03/16/2023 10:52 AM

## 2023-03-16 NOTE — Addendum Note (Signed)
Addended by: Jackquline Denmark on: 03/16/2023 01:27 PM   Modules accepted: Orders

## 2023-03-16 NOTE — Assessment & Plan Note (Addendum)
This is a very pleasant 58 female with newly diagnosed acute bilateral PE referred to hematology for recommendations. She is now on eliquis 5 mg PO BID and is tolerating it well. She has never had a history of DVT/PE, family history significant for blood clots in son and father. She had COVID illness prior to the clot, other than that she denies any provoking factors. We have discussed about etiology and risk factors for DVT/PE including stasis, surgery, trauma, hypercoagulable state, estrogen supplementation in females, testosterone supplementation in males etc.  We have discussed about symptoms and signs of DVT/PE and the need to go to the nearest facility with any symptoms concerning for recurrent DVT/PE.  We have discussed duration of anticoagulation which can range from 3 to 6 months after first episode and indefinite anticoagulation for recurrent episodes. Patient was informed of risk of bleeding with blood thinners on board however benefits outweigh risks at this time. Long-term side effects of the new novel anticoagulants remain unknown at this time. We will consider APLS work up, JAK 2 and PNH panel testing. Rest of the hypercoag work up deferred at this time. RTC with me end of May before we plan to discontinue anticoagulation. Age appropriate cancer screening advised.  We have discussed that underlying malignancy can also be a cause of unexplained DVT/PE however she has no concerning review of systems and she is up-to-date with age-appropriate cancer screening, no known family history of cancer except for one grandparent with colon cancer in her 6s.  Hence I do not believe there is a need for pan CT.  Hypertension and Hyperlipidemia Managed with Norvasc and Crestor. -Continue current medications.  General Health Maintenance -Continue age-appropriate screenings. 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 -Inform any future surgeons of VTE history for  perioperative anticoagulation planning.

## 2023-03-17 ENCOUNTER — Telehealth: Payer: Self-pay | Admitting: Hematology and Oncology

## 2023-03-17 LAB — DRVVT MIX: dRVVT Mix: 44.6 s — ABNORMAL HIGH (ref 0.0–40.4)

## 2023-03-17 LAB — LUPUS ANTICOAGULANT PANEL
DRVVT: 53.9 s — ABNORMAL HIGH (ref 0.0–47.0)
PTT Lupus Anticoagulant: 29.7 s (ref 0.0–43.5)

## 2023-03-17 LAB — BETA-2-GLYCOPROTEIN I ABS, IGG/M/A
Beta-2 Glyco I IgG: <9 GPI IgG units (ref 0–20)
Beta-2-Glycoprotein I IgA: <9 GPI IgA units (ref 0–25)
Beta-2-Glycoprotein I IgM: <9 GPI IgM units (ref 0–32)

## 2023-03-17 LAB — CARDIOLIPIN ANTIBODIES, IGG, IGM, IGA
Anticardiolipin IgA: <9 [APL'U]/mL (ref 0–11)
Anticardiolipin IgG: <9 [GPL'U]/mL (ref 0–14)
Anticardiolipin IgM: <9 [MPL'U]/mL (ref 0–12)

## 2023-03-17 LAB — DRVVT CONFIRM: dRVVT Confirm: 1 {ratio} (ref 0.8–1.2)

## 2023-03-17 NOTE — Telephone Encounter (Signed)
 Spoke with patient confirming upcoming appointments

## 2023-03-19 LAB — PNH PROFILE (-HIGH SENSITIVITY)

## 2023-03-22 LAB — MISC LABCORP TEST (SEND OUT): Labcorp test code: 489514

## 2023-03-23 ENCOUNTER — Ambulatory Visit (INDEPENDENT_AMBULATORY_CARE_PROVIDER_SITE_OTHER): Payer: Medicare Other | Admitting: Family Medicine

## 2023-04-06 DIAGNOSIS — I471 Supraventricular tachycardia, unspecified: Secondary | ICD-10-CM | POA: Diagnosis not present

## 2023-04-06 DIAGNOSIS — N1831 Chronic kidney disease, stage 3a: Secondary | ICD-10-CM | POA: Diagnosis not present

## 2023-04-06 DIAGNOSIS — R197 Diarrhea, unspecified: Secondary | ICD-10-CM | POA: Diagnosis not present

## 2023-04-06 DIAGNOSIS — R002 Palpitations: Secondary | ICD-10-CM | POA: Diagnosis not present

## 2023-04-06 DIAGNOSIS — I10 Essential (primary) hypertension: Secondary | ICD-10-CM | POA: Diagnosis not present

## 2023-04-06 DIAGNOSIS — R7303 Prediabetes: Secondary | ICD-10-CM | POA: Diagnosis not present

## 2023-04-06 DIAGNOSIS — Z86711 Personal history of pulmonary embolism: Secondary | ICD-10-CM | POA: Diagnosis not present

## 2023-04-06 DIAGNOSIS — K219 Gastro-esophageal reflux disease without esophagitis: Secondary | ICD-10-CM | POA: Diagnosis not present

## 2023-04-06 DIAGNOSIS — R0602 Shortness of breath: Secondary | ICD-10-CM | POA: Diagnosis not present

## 2023-04-06 DIAGNOSIS — I2510 Atherosclerotic heart disease of native coronary artery without angina pectoris: Secondary | ICD-10-CM | POA: Diagnosis not present

## 2023-04-06 DIAGNOSIS — E7800 Pure hypercholesterolemia, unspecified: Secondary | ICD-10-CM | POA: Diagnosis not present

## 2023-04-07 ENCOUNTER — Other Ambulatory Visit (INDEPENDENT_AMBULATORY_CARE_PROVIDER_SITE_OTHER): Payer: Self-pay

## 2023-04-20 ENCOUNTER — Ambulatory Visit (INDEPENDENT_AMBULATORY_CARE_PROVIDER_SITE_OTHER): Payer: Medicare Other | Admitting: Family Medicine

## 2023-04-20 ENCOUNTER — Emergency Department (HOSPITAL_COMMUNITY)

## 2023-04-20 ENCOUNTER — Emergency Department (HOSPITAL_COMMUNITY)
Admission: EM | Admit: 2023-04-20 | Discharge: 2023-04-20 | Disposition: A | Discharge status: Home / Self Care | Attending: Emergency Medicine | Admitting: Emergency Medicine

## 2023-04-20 ENCOUNTER — Emergency Department (HOSPITAL_BASED_OUTPATIENT_CLINIC_OR_DEPARTMENT_OTHER)

## 2023-04-20 ENCOUNTER — Other Ambulatory Visit: Payer: Self-pay

## 2023-04-20 DIAGNOSIS — M1711 Unilateral primary osteoarthritis, right knee: Secondary | ICD-10-CM | POA: Diagnosis not present

## 2023-04-20 DIAGNOSIS — M79661 Pain in right lower leg: Secondary | ICD-10-CM | POA: Diagnosis not present

## 2023-04-20 DIAGNOSIS — I10 Essential (primary) hypertension: Secondary | ICD-10-CM

## 2023-04-20 DIAGNOSIS — I447 Left bundle-branch block, unspecified: Secondary | ICD-10-CM | POA: Diagnosis not present

## 2023-04-20 DIAGNOSIS — R079 Chest pain, unspecified: Secondary | ICD-10-CM

## 2023-04-20 DIAGNOSIS — M25461 Effusion, right knee: Secondary | ICD-10-CM | POA: Diagnosis not present

## 2023-04-20 DIAGNOSIS — N1831 Chronic kidney disease, stage 3a: Secondary | ICD-10-CM | POA: Insufficient documentation

## 2023-04-20 DIAGNOSIS — Z79899 Other long term (current) drug therapy: Secondary | ICD-10-CM | POA: Insufficient documentation

## 2023-04-20 DIAGNOSIS — I700 Atherosclerosis of aorta: Secondary | ICD-10-CM | POA: Diagnosis not present

## 2023-04-20 DIAGNOSIS — R6889 Other general symptoms and signs: Secondary | ICD-10-CM | POA: Diagnosis not present

## 2023-04-20 DIAGNOSIS — K449 Diaphragmatic hernia without obstruction or gangrene: Secondary | ICD-10-CM | POA: Diagnosis not present

## 2023-04-20 DIAGNOSIS — R519 Headache, unspecified: Secondary | ICD-10-CM | POA: Diagnosis not present

## 2023-04-20 DIAGNOSIS — Z743 Need for continuous supervision: Secondary | ICD-10-CM | POA: Diagnosis not present

## 2023-04-20 DIAGNOSIS — I2510 Atherosclerotic heart disease of native coronary artery without angina pectoris: Secondary | ICD-10-CM | POA: Diagnosis not present

## 2023-04-20 DIAGNOSIS — R0602 Shortness of breath: Secondary | ICD-10-CM | POA: Diagnosis not present

## 2023-04-20 DIAGNOSIS — E785 Hyperlipidemia, unspecified: Secondary | ICD-10-CM | POA: Insufficient documentation

## 2023-04-20 DIAGNOSIS — Z7901 Long term (current) use of anticoagulants: Secondary | ICD-10-CM | POA: Insufficient documentation

## 2023-04-20 DIAGNOSIS — J439 Emphysema, unspecified: Secondary | ICD-10-CM | POA: Diagnosis not present

## 2023-04-20 DIAGNOSIS — R202 Paresthesia of skin: Secondary | ICD-10-CM | POA: Insufficient documentation

## 2023-04-20 DIAGNOSIS — M25561 Pain in right knee: Secondary | ICD-10-CM | POA: Diagnosis not present

## 2023-04-20 DIAGNOSIS — I129 Hypertensive chronic kidney disease with stage 1 through stage 4 chronic kidney disease, or unspecified chronic kidney disease: Secondary | ICD-10-CM | POA: Insufficient documentation

## 2023-04-20 DIAGNOSIS — R0789 Other chest pain: Secondary | ICD-10-CM | POA: Diagnosis not present

## 2023-04-20 LAB — BASIC METABOLIC PANEL
Anion gap: 7 (ref 5–15)
BUN: 9 mg/dL (ref 8–23)
CO2: 26 mmol/L (ref 22–32)
Calcium: 9.3 mg/dL (ref 8.9–10.3)
Chloride: 107 mmol/L (ref 98–111)
Creatinine, Ser: 0.9 mg/dL (ref 0.44–1.00)
GFR, Estimated: >60 mL/min (ref >60)
Glucose, Bld: 132 mg/dL — ABNORMAL HIGH (ref 70–99)
Potassium: 3.9 mmol/L (ref 3.5–5.1)
Sodium: 140 mmol/L (ref 135–145)

## 2023-04-20 LAB — HEPATIC FUNCTION PANEL
ALT: 17 U/L (ref 0–44)
AST: 20 U/L (ref 15–41)
Albumin: 3.6 g/dL (ref 3.5–5.0)
Alkaline Phosphatase: 54 U/L (ref 38–126)
Bilirubin, Direct: <0.1 mg/dL (ref 0.0–0.2)
Total Bilirubin: 0.4 mg/dL (ref 0.0–1.2)
Total Protein: 6.7 g/dL (ref 6.5–8.1)

## 2023-04-20 LAB — I-STAT CHEM 8, ED
BUN: 9 mg/dL (ref 8–23)
Calcium, Ion: 1.16 mmol/L (ref 1.15–1.40)
Chloride: 107 mmol/L (ref 98–111)
Creatinine, Ser: 0.8 mg/dL (ref 0.44–1.00)
Glucose, Bld: 128 mg/dL — ABNORMAL HIGH (ref 70–99)
HCT: 41 % (ref 36.0–46.0)
Hemoglobin: 13.9 g/dL (ref 12.0–15.0)
Potassium: 3.8 mmol/L (ref 3.5–5.1)
Sodium: 140 mmol/L (ref 135–145)
TCO2: 22 mmol/L (ref 22–32)

## 2023-04-20 LAB — CBC
HCT: 41.3 % (ref 36.0–46.0)
Hemoglobin: 13.8 g/dL (ref 12.0–15.0)
MCH: 31.4 pg (ref 26.0–34.0)
MCHC: 33.4 g/dL (ref 30.0–36.0)
MCV: 94.1 fL (ref 80.0–100.0)
Platelets: 341 10*3/uL (ref 150–400)
RBC: 4.39 MIL/uL (ref 3.87–5.11)
RDW: 13.9 % (ref 11.5–15.5)
WBC: 5.4 10*3/uL (ref 4.0–10.5)
nRBC: 0 % (ref 0.0–0.2)

## 2023-04-20 LAB — TROPONIN I (HIGH SENSITIVITY)
Troponin I (High Sensitivity): 6 ng/L (ref <18)
Troponin I (High Sensitivity): 6 ng/L (ref <18)

## 2023-04-20 LAB — LIPASE, BLOOD: Lipase: 32 U/L (ref 11–51)

## 2023-04-20 MED ORDER — PANTOPRAZOLE SODIUM 20 MG PO TBEC
20.0000 mg | DELAYED_RELEASE_TABLET | Freq: Once | ORAL | Status: AC
  Administered 2023-04-20: 20 mg via ORAL
  Filled 2023-04-20: qty 1

## 2023-04-20 MED ORDER — LACTATED RINGERS IV BOLUS
1000.0000 mL | Freq: Once | INTRAVENOUS | Status: AC
  Administered 2023-04-20: 1000 mL via INTRAVENOUS

## 2023-04-20 MED ORDER — APIXABAN 5 MG PO TABS
5.0000 mg | ORAL_TABLET | Freq: Once | ORAL | Status: AC
  Administered 2023-04-20: 5 mg via ORAL
  Filled 2023-04-20: qty 1

## 2023-04-20 MED ORDER — AMLODIPINE BESYLATE 5 MG PO TABS
5.0000 mg | ORAL_TABLET | Freq: Once | ORAL | Status: AC
  Administered 2023-04-20: 5 mg via ORAL
  Filled 2023-04-20: qty 1

## 2023-04-20 MED ORDER — HYDROCODONE-ACETAMINOPHEN 5-325 MG PO TABS
1.0000 | ORAL_TABLET | Freq: Once | ORAL | Status: AC
  Administered 2023-04-20: 1 via ORAL
  Filled 2023-04-20: qty 1

## 2023-04-20 MED ORDER — APIXABAN 5 MG PO TABS: 5.0000 mg | ORAL_TABLET | Freq: Two times a day (BID) | ORAL | Status: DC

## 2023-04-20 MED ORDER — CARVEDILOL 12.5 MG PO TABS
12.5000 mg | ORAL_TABLET | Freq: Once | ORAL | Status: AC
  Administered 2023-04-20: 12.5 mg via ORAL
  Filled 2023-04-20: qty 1

## 2023-04-20 MED ORDER — IOHEXOL 350 MG/ML SOLN
75.0000 mL | Freq: Once | INTRAVENOUS | Status: AC | PRN
  Administered 2023-04-20: 75 mL via INTRAVENOUS

## 2023-04-20 MED ORDER — APIXABAN 5 MG PO TABS
5.0000 mg | ORAL_TABLET | Freq: Two times a day (BID) | ORAL | Status: DC
Start: 2023-04-27 — End: 2023-04-20

## 2023-04-20 MED ORDER — HYDROCODONE-ACETAMINOPHEN 5-325 MG PO TABS: 1.0000 | ORAL_TABLET | Freq: Four times a day (QID) | ORAL | 0 refills | Status: AC | PRN

## 2023-04-20 MED ORDER — PANTOPRAZOLE SODIUM 20 MG PO TBEC: 20.0000 mg | DELAYED_RELEASE_TABLET | Freq: Every day | ORAL | 0 refills | Status: DC

## 2023-04-20 NOTE — ED Notes (Signed)
 ED Provider at bedside.

## 2023-04-20 NOTE — Progress Notes (Signed)
 Right lower extremity venous duplex has been completed. Preliminary results can be found in CV Proc through chart review.  Results were given to Dr. Donnald Garre.  04/20/23 12:26 PM Olen Cordial RVT

## 2023-04-20 NOTE — ED Provider Triage Note (Signed)
 Emergency Medicine Provider Triage Evaluation Note  [name] , a 78 y.o. female  was evaluated in triage.  Pt complains of burning chest pain right upper chest radiating to the back.  Worse with inspiration.  Started today.  Right leg swelling and pain.  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  She takes Eliquis twice daily.  Last Eliquis dose was yesterday evening at 9 PM.  Patient did not take her blood pressure medications yet this morning.  She called EMS for pain and hypertension with pain behind the knee.  Patient was treated with nitroglycerin and aspirin.  Initial blood pressure was 200/100.  On arrival is down to 156/70.  Repeat blood pressure now is at 127/73.  This is with nitroglycerin only as blood pressure control today.  Patient is due to take amlodipine and Coreg.  Review of Systems  Positive: Chest pain Negative: Vomiting  Physical Exam  BP 127/73   Pulse 69   Temp 97.9 F (36.6 C)   Resp 16   SpO2 98%  Gen:   Awake, no distress   Resp:  Normal effort clear to auscultation Cardiac: regular MSK:   Moves extremities without difficulty endorses pain behind the right knee to palpation Other:  Mental status is clear.  Medical Decision Making  Medically screening exam initiated at 11:39 AM.  Appropriate orders placed.  [name] was informed that the remainder of the evaluation will be completed by another provider, this initial triage assessment does not replace that evaluation, and the importance of remaining in the ED until their evaluation is complete.     Arby Barrette, MD 04/20/23 1145

## 2023-04-20 NOTE — ED Triage Notes (Signed)
 Patient BIB [facility] from home for pain behind her knee, burning in her chest, found to be hypertensive by EMS. Patient dx with PE in November, did not take any of her meds this morning incl Eliquis. EMS gave 0.4mg  nitroglycerin and 324mg  aspirin en route. Patient also c/o bilateral hand and foot numbness and tingling. BP 200/100, down to 156/70 with nitroglycerin.

## 2023-04-20 NOTE — ED Provider Notes (Signed)
 [place] [facility] AT [facility] Provider Note   CSN: [id number] Arrival date & time: 04/20/23  1119     History  Chief Complaint  Patient presents with   Hypertension    [name] is a 78 y.o. female.  Pt is a 77y/o female with hx of nonobstructive coronary artery disease, HLD, HTN, CKD 3a, LE edema, PE in 11/24 on eliquis who is presenting today with c/o of chest pain in the right side and center of her chest that radiates into her back but does not seem to be associated with eating as well as pain and swelling behind the right knee that goes into her right thigh.  She reports the right knee pain and thigh pain has been going on for about a week but the chest pain was this morning.  She denies having it at this time but reports this morning when this was happening she also noted her blood pressure was very high.  She is compliant with her medications but did not have any of her medicines this morning prior to calling 911.  She reports yesterday she just did not have much of an appetite and ate very little but denied being nauseated or having vomiting or diarrhea.  She had no abdominal pain.  She reports the same today she is not very hungry but does not have any abdominal pain.  She denies any significant cough but has had a little bit of nasal congestion more consistent with allergies.  She also reports the pain is a little bit pleuritic.  Walking makes the pain in her knee worse but she has not had any fevers.  She has not had any recent change in her medications since her hospitalization at the end of November when the PEs were discovered.  She also reports for several months now she will have intermittent stocking glove tingling of all 4 extremities but denies having it currently.  She did follow-up with her doctor and was told her vitamin D levels were low and she is taking a supplement for that but is not sure if she had her other vitamin levels checked.  She has  been following with cardiology for over a year and in 2019 had a CT which showed minimal disease and a low calcium score with a normal stress test at that time.  She has been having modification of disease factors since that time with hyper tension meds and hyperlipidemia meds.  The history is provided by the patient and medical records.  Hypertension       Home Medications Prior to Admission medications   Medication Sig Start Date End Date Taking? Authorizing Provider  HYDROcodone-acetaminophen (NORCO/VICODIN) 5-325 MG tablet Take 1 tablet by mouth every 6 (six) hours as needed. 04/20/23  Yes Boss Danielsen, Alphonzo Lemmings, MD  pantoprazole (PROTONIX) 20 MG tablet Take 1 tablet (20 mg total) by mouth daily. 04/20/23  Yes Gwyneth Sprout, MD  acetaminophen (TYLENOL) 650 MG CR tablet Take 650 mg by mouth every 8 (eight) hours as needed for pain.    [provider]  amLODipine (NORVASC) 5 MG tablet Take 5 mg by mouth every morning.    [provider]  APIXABAN Everlene Balls) VTE STARTER PACK (10MG  AND 5MG ) Take as directed on package: start with two-5mg  tablets twice daily for 7 days. On day 8, switch to one-5mg  tablet twice daily. 01/12/23   Jerald Kief, MD  Ascorbic Acid (VITAMIN C) 1000 MG tablet Take 1,000 mg by  mouth daily.    [provider]  carvedilol (COREG) 12.5 MG tablet Take 1 tablet (12.5 mg total) by mouth 2 (two) times daily with a meal. 06/18/14   Barrett, Joline Salt, PA-C  cholecalciferol (VITAMIN D) 25 MCG (1000 UNIT) tablet Take 1,000 Units by mouth daily.    [provider]  furosemide (LASIX) 20 MG tablet Take 1 tablet (20 mg total) by mouth 3 (three) times a week. Take Mondays, Wednesdays, Fridays. 02/13/23 05/14/23  Alver Sorrow, NP  Multiple Vitamins-Minerals (CENTRUM SILVER PO) Take by mouth.    [provider]  rosuvastatin (CRESTOR) 20 MG tablet Take 1 tablet (20 mg total) by mouth daily. 11/11/22   Alver Sorrow, NP  Vitamin D,  Ergocalciferol, (DRISDOL) 1.25 MG (50000 UNIT) CAPS capsule Take 1 capsule (50,000 Units total) by mouth every 7 (seven) days. Patient not taking: Reported on 01/09/2023 01/06/23   Langston Reusing, MD      Allergies    Kiwi extract, Meloxicam, and Pineapple    Review of Systems   Review of Systems  Physical Exam Updated Vital Signs BP (!) 168/79   Pulse 62   Temp 98.4 F (36.9 C) (Oral)   Resp 18   Ht 5\' 5"  (1.651 m)   Wt 117.9 kg   SpO2 100%   BMI 43.27 kg/m  Physical Exam Vitals and nursing note reviewed.  Constitutional:      General: She is not in acute distress.    Appearance: She is well-developed.  HENT:     Head: Normocephalic and atraumatic.  Eyes:     Pupils: Pupils are equal, round, and reactive to light.  Cardiovascular:     Rate and Rhythm: Normal rate and regular rhythm.     Pulses: Normal pulses.     Heart sounds: Normal heart sounds. No murmur heard.    No friction rub.  Pulmonary:     Effort: Pulmonary effort is normal.     Breath sounds: Normal breath sounds. No wheezing or rales.  Abdominal:     General: Bowel sounds are normal. There is no distension.     Palpations: Abdomen is soft.     Tenderness: There is no abdominal tenderness. There is no guarding or rebound.  Musculoskeletal:        General: Tenderness present. Normal range of motion.     Right knee: Tenderness present.     Comments: Bilateral edema in the lower extremities.  Tenderness with palpation of the right popliteal fossa without any rashes notable.  Skin:    General: Skin is warm and dry.     Findings: No rash.  Neurological:     Mental Status: She is alert and oriented to person, place, and time.     Cranial Nerves: No cranial nerve deficit.     Sensory: No sensory deficit.     Motor: No weakness.     Gait: Gait normal.  Psychiatric:        Behavior: Behavior normal.     ED Results / Procedures / Treatments   Labs (all labs ordered are listed, but only abnormal  results are displayed) Labs Reviewed  BASIC METABOLIC PANEL - Abnormal; Notable for the following components:      Result Value   Glucose, Bld 132 (*)    All other components within normal limits  I-STAT CHEM 8, ED - Abnormal; Notable for the following components:   Glucose, Bld 128 (*)    All other components  within normal limits  CBC  LIPASE, BLOOD  HEPATIC FUNCTION PANEL  I-STAT VENOUS BLOOD GAS, ED  TROPONIN I (HIGH SENSITIVITY)  TROPONIN I (HIGH SENSITIVITY)    EKG EKG Interpretation Date/Time:  Monday April 20 2023 11:30:10 EDT Ventricular Rate:  83 PR Interval:  134 QRS Duration:  120 QT Interval:  392 QTC Calculation: 460 R Axis:   -15  Text Interpretation: Normal sinus rhythm Left ventricular hypertrophy with QRS widening and repolarization abnormality ( R in aVL , Cornell product ) No significant change since last tracing When compared with ECG of 08-Jan-2023 22:06, PREVIOUS ECG IS PRESENT Confirmed by Gwyneth Sprout ([id number]) on 04/20/2023 4:06:04 PM  Radiology DG Knee Complete 4 Views Right Result Date: 04/20/2023 CLINICAL DATA:  Pain. EXAM: RIGHT KNEE - COMPLETE 4+ VIEW COMPARISON:  None Available. FINDINGS: Medial compartment joint space narrowing. Mild tricompartmental peripheral spurring. Small joint effusion. Probable ossified intra-articular bodies posteriorly. No fracture, erosion or focal bone abnormality. Mild soft tissue edema. IMPRESSION: 1. Moderate tricompartmental osteoarthritis with medial compartment joint space narrowing. 2. Small joint effusion with probable ossified intra-articular bodies posteriorly. Electronically Signed   By: Narda Rutherford M.D.   On: 04/20/2023 19:25   CT Head Wo Contrast Result Date: 04/20/2023 CLINICAL DATA:  Headache.  New onset. EXAM: CT HEAD WITHOUT CONTRAST TECHNIQUE: Contiguous axial images were obtained from the base of the skull through the vertex without intravenous contrast. RADIATION DOSE REDUCTION: This exam was  performed according to the departmental dose-optimization program which includes automated exposure control, adjustment of the mA and/or kV according to patient size and/or use of iterative reconstruction technique. COMPARISON:  None Available. FINDINGS: Brain: No evidence of acute infarction, hemorrhage, hydrocephalus, extra-axial collection or mass lesion/mass effect. There is bilateral periventricular hypodensity, which is non-specific but most likely seen in the settings of microvascular ischemic changes. Mild in extent. Otherwise normal appearance of brain parenchyma. Ventricles are normal. Cerebral volume is age appropriate. Vascular: No hyperdense vessel or unexpected calcification. Skull: Normal. Negative for fracture or focal lesion. Note is made of hyperostosis frontalis interna. Sinuses/Orbits: No acute finding. Other: Visualized mastoid air cells are unremarkable. No mastoid effusion. IMPRESSION: *No acute intracranial abnormality. Electronically Signed   By: Jules Schick M.D.   On: 04/20/2023 15:48   CT Angio Chest PE W and/or Wo Contrast Result Date: 04/20/2023 CLINICAL DATA:  Pulmonary embolism (PE) suspected, high prob. Burning chest pain radiating to back. EXAM: CT ANGIOGRAPHY CHEST WITH CONTRAST TECHNIQUE: Multidetector CT imaging of the chest was performed using the standard protocol during bolus administration of intravenous contrast. Multiplanar CT image reconstructions and MIPs were obtained to evaluate the vascular anatomy. RADIATION DOSE REDUCTION: This exam was performed according to the departmental dose-optimization program which includes automated exposure control, adjustment of the mA and/or kV according to patient size and/or use of iterative reconstruction technique. CONTRAST:  75mL OMNIPAQUE IOHEXOL 350 MG/ML SOLN COMPARISON:  CT angiography chest from 01/08/2023. FINDINGS: Cardiovascular: No evidence of embolism to the proximal subsegmental pulmonary artery level. Normal cardiac  size. No pericardial effusion. No aortic aneurysm. There are mild peripheral atherosclerotic vascular calcifications of thoracic aorta and its major branches. Mediastinum/Nodes: Visualized thyroid gland appears grossly unremarkable. No solid / cystic mediastinal masses. The esophagus is nondistended precluding optimal assessment. No axillary, mediastinal or hilar lymphadenopathy by size criteria. Lungs/Pleura: The central tracheo-bronchial tree is patent. Mild upper lobe predominant paraseptal emphysematous changes noted. There also dependent changes in bilateral lungs. No mass or consolidation. No pleural effusion or  pneumothorax. No suspicious lung nodules. Upper Abdomen: There is a tiny sliding hiatal hernia. Remaining visualized upper abdominal viscera within normal limits. Musculoskeletal: The visualized soft tissues of the chest wall are grossly unremarkable. No suspicious osseous lesions. There are mild multilevel degenerative changes in the visualized spine. Review of the MIP images confirms the above findings. IMPRESSION: 1. No embolism to the proximal subsegmental pulmonary artery level. 2. No lung mass, consolidation, pleural effusion or pneumothorax. 3. Multiple other nonacute observations, as described above. Aortic Atherosclerosis (ICD10-I70.0) and Emphysema (ICD10-J43.9). Electronically Signed   By: Jules Schick M.D.   On: 04/20/2023 15:44   DG Chest 2 View Result Date: 04/20/2023 CLINICAL DATA:  Chest pain.  Shortness of breath. EXAM: CHEST - 2 VIEW COMPARISON:  01/08/2023. FINDINGS: Bilateral lung fields are clear. Bilateral costophrenic angles are clear. Normal cardio-mediastinal silhouette. No acute osseous abnormalities. The soft tissues are within normal limits. IMPRESSION: No active cardiopulmonary disease. Electronically Signed   By: Jules Schick M.D.   On: 04/20/2023 15:38   VAS [place] LOWER EXTREMITY VENOUS (DVT) (ONLY [facility] & [facility]) Result Date: 04/20/2023  Lower Venous DVT Study Patient  Name:  TACORA ATHANAS  Date of Exam:   04/20/2023 Medical Rec #: [id number]       Accession #:    [id number] Date of Birth: [birth year]       Patient Gender: F Patient Age:   32 years Exam Location:  [facility] Procedure:      VAS [place] LOWER EXTREMITY VENOUS (DVT) Referring Phys: [facility] PFEIFFER --------------------------------------------------------------------------------  Indications: Pain.  Risk Factors: None identified. Limitations: Poor ultrasound/tissue interface. Comparison Study: No prior studies. Performing Technologist: Chanda Busing RVT  Examination Guidelines: A complete evaluation includes B-mode imaging, spectral Doppler, color Doppler, and power Doppler as needed of all accessible portions of each vessel. Bilateral testing is considered an integral part of a complete examination. Limited examinations for reoccurring indications may be performed as noted. The reflux portion of the exam is performed with the patient in reverse Trendelenburg.  +---------+---------------+---------+-----------+----------+-------------------+ RIGHT    CompressibilityPhasicitySpontaneityPropertiesThrombus Aging      +---------+---------------+---------+-----------+----------+-------------------+ CFV      Full           Yes      Yes                                      +---------+---------------+---------+-----------+----------+-------------------+ SFJ      Full                                                             +---------+---------------+---------+-----------+----------+-------------------+ FV Prox  Full                                                             +---------+---------------+---------+-----------+----------+-------------------+ FV Mid   Full                                                             +---------+---------------+---------+-----------+----------+-------------------+  FV DistalFull                                                              +---------+---------------+---------+-----------+----------+-------------------+ PFV      Full                                                             +---------+---------------+---------+-----------+----------+-------------------+ POP      Full           Yes      Yes                                      +---------+---------------+---------+-----------+----------+-------------------+ PTV      Full                                                             +---------+---------------+---------+-----------+----------+-------------------+ PERO                                                  Not well visualized +---------+---------------+---------+-----------+----------+-------------------+   +----+---------------+---------+-----------+----------+--------------+ LEFTCompressibilityPhasicitySpontaneityPropertiesThrombus Aging +----+---------------+---------+-----------+----------+--------------+ CFV Full           Yes      Yes                                 +----+---------------+---------+-----------+----------+--------------+    Summary: RIGHT: - There is no evidence of deep vein thrombosis in the lower extremity. However, portions of this examination were limited- see technologist comments above.  - No cystic structure found in the popliteal fossa.  LEFT: - No evidence of common femoral vein obstruction.   *See table(s) above for measurements and observations. Electronically signed by Sherald Hess MD on 04/20/2023 at 2:19:10 PM.    Final     Procedures Procedures    Medications Ordered in ED Medications  amLODipine (NORVASC) tablet 5 mg (5 mg Oral Given 04/20/23 1252)  apixaban (ELIQUIS) tablet 5 mg (5 mg Oral Given 04/20/23 1252)  iohexol (OMNIPAQUE) 350 MG/ML injection 75 mL (75 mLs Intravenous Contrast Given 04/20/23 1412)  lactated ringers bolus 1,000 mL (0 mLs Intravenous Stopped 04/20/23 2125)  carvedilol (COREG) tablet 12.5 mg (12.5 mg Oral Given  04/20/23 2121)  HYDROcodone-acetaminophen (NORCO/VICODIN) 5-325 MG per tablet 1 tablet (1 tablet Oral Given 04/20/23 2121)  pantoprazole (PROTONIX) EC tablet 20 mg (20 mg Oral Given 04/20/23 2121)    ED Course/ Medical Decision Making/ A&P                                 Medical Decision Making Amount and/or Complexity  of Data Reviewed External Data Reviewed: notes. Labs: ordered. Decision-making details documented in ED Course. Radiology: ordered and independent interpretation performed. Decision-making details documented in ED Course. ECG/medicine tests: ordered and independent interpretation performed. Decision-making details documented in ED Course.  Risk Prescription drug management.   Pt with multiple medical problems and comorbidities and presenting today with a complaint that caries a high risk for morbidity and mortality.  Here today with several complaints.  Initial complaint is of pain in her chest that does have a pleuritic component and hypertension prior to arrival.  Patient's blood pressure did improve after having nitroglycerin and then she was given the home dose of her blood pressure medications when she had not taken prior to coming to the hospital.  Patient has been under quite a bit of stress recently because her brother had been in the hospital when she also reported not having much of an appetite yesterday.  Need to rule out recurrent PE, ACS, possible hypertensive urgency with endorgan damage such as AKI or anemia.  Possibly the this could be early viral etiology or allergies with pleurisy.  Does not seem to be GI related at this time but patient does suffer from GERD.  She has no abdominal pain to suggest cholelithiasis.  Also complaining of right knee pain for a while now that goes into her groin but denied any trauma.  Will rule out DVT given her prior history but she has been compliant with her Eliquis.  Also possible Baker's cyst or arthritis.  No findings on exam to  suggest gout or septic joint.  Pulses are intact in all 4 extremities with normal function. I independently interpreted patient's labs and EKG.  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  Chem-8 without acute findings, BMP within normal limits, CBC, delta troponins and lipase are all normal, LFTs within normal limits.  I have independently visualized and interpreted pt's images today.  Head CT today without evidence of mass or bleed.  Radiology reports normal head CT.  CTA of the chest shows no evidence of PE and radiology reports no embolism consolidation or effusion.  It does appear that they do remark on a small sliding hiatal hernia but no other acute findings and ultrasound of the leg was negative for DVT.  Chest x-ray was within normal limits.  Findings were discussed with the patient.  Blood pressure remains improved after her medications.  Will do an x-ray for evaluation of her knee. Knee film with arthritis and small effusion but no other acute findings.  This is most likely the cause for her pain.  On repeat evaluation patient was inquiring about her blood pressure and reports it has been quite elevated at home usually greater than 140/80 since stopping her blood pressure medication in December.  She has not had her carvedilol today and was given a dose here but discussed with her she could increase her amlodipine to 10 mg until she follows up with her doctor and monitor her blood pressure.  Also patient did have a small hiatal hernia and discussed this with her and she reports she has had some burning and irritation consistent with GERD lately.  She has been on Pepcid for a prolonged period of time.  Will have her discontinue the Pepcid and started pantoprazole.  She has a follow-up with her doctor on Wednesday.  At this time there is no indication for admission.  Patient is well-appearing and at  this time feel that she is stable for discharge  home.  She and her family are comfortable with this plan.  Discussed avoiding NSAIDs, spicy foods and eating before she goes to bed.  She was given return precautions.          Final Clinical Impression(s) / ED Diagnoses Final diagnoses:  Primary hypertension  Hiatal hernia  Nonspecific chest pain  Primary osteoarthritis of right knee    Rx / DC Orders ED Discharge Orders          Ordered    HYDROcodone-acetaminophen (NORCO/VICODIN) 5-325 MG tablet  Every 6 hours PRN        04/20/23 2109    pantoprazole (PROTONIX) 20 MG tablet  Daily        04/20/23 2109              Gwyneth Sprout, MD 04/20/23 2154

## 2023-04-20 NOTE — Discharge Instructions (Addendum)
 Tomorrow if your blood pressure is still running high you can take 2 amlodipine so a total of 10 mg in addition to your Coreg.  You can continue doing that until you see your doctor on Wednesday if your blood pressure is higher than 130/80.  Blood work today and x-rays look good except for bad arthritis in your knee.  No signs of blood clots and heart markers were all normal.  We are also going to have you stop your Pepcid and we started you on a different antacid that you will take 1 time a day.

## 2023-04-20 NOTE — ED Notes (Signed)
 Patient transported to X-ray

## 2023-04-22 DIAGNOSIS — I2510 Atherosclerotic heart disease of native coronary artery without angina pectoris: Secondary | ICD-10-CM | POA: Diagnosis not present

## 2023-04-22 DIAGNOSIS — N1831 Chronic kidney disease, stage 3a: Secondary | ICD-10-CM | POA: Diagnosis not present

## 2023-04-22 DIAGNOSIS — I10 Essential (primary) hypertension: Secondary | ICD-10-CM | POA: Diagnosis not present

## 2023-04-22 DIAGNOSIS — R42 Dizziness and giddiness: Secondary | ICD-10-CM | POA: Diagnosis not present

## 2023-04-22 DIAGNOSIS — R002 Palpitations: Secondary | ICD-10-CM | POA: Diagnosis not present

## 2023-04-22 DIAGNOSIS — Z86711 Personal history of pulmonary embolism: Secondary | ICD-10-CM | POA: Diagnosis not present

## 2023-04-22 DIAGNOSIS — R7303 Prediabetes: Secondary | ICD-10-CM | POA: Diagnosis not present

## 2023-04-22 DIAGNOSIS — R197 Diarrhea, unspecified: Secondary | ICD-10-CM | POA: Diagnosis not present

## 2023-05-15 ENCOUNTER — Encounter (HOSPITAL_COMMUNITY): Payer: Self-pay | Admitting: Emergency Medicine

## 2023-05-15 ENCOUNTER — Other Ambulatory Visit: Payer: Self-pay

## 2023-05-15 ENCOUNTER — Emergency Department (HOSPITAL_COMMUNITY)

## 2023-05-15 ENCOUNTER — Inpatient Hospital Stay (HOSPITAL_COMMUNITY)
Admission: EM | Admit: 2023-05-15 | Discharge: 2023-05-18 | DRG: 392 | Disposition: A | Discharge status: Home / Self Care | Attending: Internal Medicine | Admitting: Internal Medicine

## 2023-05-15 DIAGNOSIS — Z91018 Allergy to other foods: Secondary | ICD-10-CM | POA: Diagnosis not present

## 2023-05-15 DIAGNOSIS — Z8249 Family history of ischemic heart disease and other diseases of the circulatory system: Secondary | ICD-10-CM | POA: Diagnosis not present

## 2023-05-15 DIAGNOSIS — I447 Left bundle-branch block, unspecified: Secondary | ICD-10-CM | POA: Diagnosis not present

## 2023-05-15 DIAGNOSIS — R002 Palpitations: Secondary | ICD-10-CM | POA: Diagnosis not present

## 2023-05-15 DIAGNOSIS — R1031 Right lower quadrant pain: Principal | ICD-10-CM

## 2023-05-15 DIAGNOSIS — K5730 Diverticulosis of large intestine without perforation or abscess without bleeding: Secondary | ICD-10-CM | POA: Diagnosis not present

## 2023-05-15 DIAGNOSIS — Z79899 Other long term (current) drug therapy: Secondary | ICD-10-CM

## 2023-05-15 DIAGNOSIS — Z7901 Long term (current) use of anticoagulants: Secondary | ICD-10-CM | POA: Diagnosis not present

## 2023-05-15 DIAGNOSIS — K5900 Constipation, unspecified: Secondary | ICD-10-CM | POA: Diagnosis present

## 2023-05-15 DIAGNOSIS — G8929 Other chronic pain: Secondary | ICD-10-CM | POA: Diagnosis present

## 2023-05-15 DIAGNOSIS — K529 Noninfective gastroenteritis and colitis, unspecified: Secondary | ICD-10-CM | POA: Diagnosis not present

## 2023-05-15 DIAGNOSIS — Z86711 Personal history of pulmonary embolism: Secondary | ICD-10-CM

## 2023-05-15 DIAGNOSIS — E876 Hypokalemia: Secondary | ICD-10-CM | POA: Diagnosis not present

## 2023-05-15 DIAGNOSIS — Z83438 Family history of other disorder of lipoprotein metabolism and other lipidemia: Secondary | ICD-10-CM | POA: Diagnosis not present

## 2023-05-15 DIAGNOSIS — Z9102 Food additives allergy status: Secondary | ICD-10-CM

## 2023-05-15 DIAGNOSIS — Z833 Family history of diabetes mellitus: Secondary | ICD-10-CM | POA: Diagnosis not present

## 2023-05-15 DIAGNOSIS — K219 Gastro-esophageal reflux disease without esophagitis: Secondary | ICD-10-CM | POA: Diagnosis not present

## 2023-05-15 DIAGNOSIS — Z886 Allergy status to analgesic agent status: Secondary | ICD-10-CM

## 2023-05-15 DIAGNOSIS — N281 Cyst of kidney, acquired: Secondary | ICD-10-CM | POA: Diagnosis not present

## 2023-05-15 DIAGNOSIS — G4730 Sleep apnea, unspecified: Secondary | ICD-10-CM | POA: Diagnosis not present

## 2023-05-15 DIAGNOSIS — Z96641 Presence of right artificial hip joint: Secondary | ICD-10-CM | POA: Diagnosis not present

## 2023-05-15 DIAGNOSIS — Z811 Family history of alcohol abuse and dependence: Secondary | ICD-10-CM

## 2023-05-15 DIAGNOSIS — M549 Dorsalgia, unspecified: Secondary | ICD-10-CM | POA: Diagnosis not present

## 2023-05-15 DIAGNOSIS — Z818 Family history of other mental and behavioral disorders: Secondary | ICD-10-CM

## 2023-05-15 DIAGNOSIS — Z87891 Personal history of nicotine dependence: Secondary | ICD-10-CM | POA: Diagnosis not present

## 2023-05-15 DIAGNOSIS — I10 Essential (primary) hypertension: Secondary | ICD-10-CM | POA: Diagnosis present

## 2023-05-15 DIAGNOSIS — R7303 Prediabetes: Secondary | ICD-10-CM | POA: Diagnosis present

## 2023-05-15 DIAGNOSIS — R739 Hyperglycemia, unspecified: Secondary | ICD-10-CM | POA: Diagnosis present

## 2023-05-15 DIAGNOSIS — I2699 Other pulmonary embolism without acute cor pulmonale: Secondary | ICD-10-CM | POA: Diagnosis present

## 2023-05-15 DIAGNOSIS — E559 Vitamin D deficiency, unspecified: Secondary | ICD-10-CM | POA: Diagnosis not present

## 2023-05-15 DIAGNOSIS — R933 Abnormal findings on diagnostic imaging of other parts of digestive tract: Secondary | ICD-10-CM | POA: Diagnosis not present

## 2023-05-15 DIAGNOSIS — R110 Nausea: Secondary | ICD-10-CM | POA: Diagnosis not present

## 2023-05-15 DIAGNOSIS — K5732 Diverticulitis of large intestine without perforation or abscess without bleeding: Secondary | ICD-10-CM | POA: Diagnosis not present

## 2023-05-15 DIAGNOSIS — R109 Unspecified abdominal pain: Secondary | ICD-10-CM | POA: Diagnosis not present

## 2023-05-15 DIAGNOSIS — E785 Hyperlipidemia, unspecified: Secondary | ICD-10-CM | POA: Diagnosis present

## 2023-05-15 DIAGNOSIS — Z823 Family history of stroke: Secondary | ICD-10-CM

## 2023-05-15 DIAGNOSIS — Z841 Family history of disorders of kidney and ureter: Secondary | ICD-10-CM

## 2023-05-15 LAB — CBC WITH DIFFERENTIAL/PLATELET
Abs Immature Granulocytes: 0.01 10*3/uL (ref 0.00–0.07)
Basophils Absolute: 0 10*3/uL (ref 0.0–0.1)
Basophils Relative: 1 %
Eosinophils Absolute: 0.1 10*3/uL (ref 0.0–0.5)
Eosinophils Relative: 1 %
HCT: 40.9 % (ref 36.0–46.0)
Hemoglobin: 13.7 g/dL (ref 12.0–15.0)
Immature Granulocytes: 0 %
Lymphocytes Relative: 19 %
Lymphs Abs: 1.4 10*3/uL (ref 0.7–4.0)
MCH: 31.5 pg (ref 26.0–34.0)
MCHC: 33.5 g/dL (ref 30.0–36.0)
MCV: 94 fL (ref 80.0–100.0)
Monocytes Absolute: 0.5 10*3/uL (ref 0.1–1.0)
Monocytes Relative: 7 %
Neutro Abs: 5.6 10*3/uL (ref 1.7–7.7)
Neutrophils Relative %: 72 %
Platelets: 309 10*3/uL (ref 150–400)
RBC: 4.35 MIL/uL (ref 3.87–5.11)
RDW: 13.4 % (ref 11.5–15.5)
WBC: 7.7 10*3/uL (ref 4.0–10.5)
nRBC: 0 % (ref 0.0–0.2)

## 2023-05-15 LAB — URINALYSIS, ROUTINE W REFLEX MICROSCOPIC
Bacteria, UA: NONE SEEN
Bilirubin Urine: NEGATIVE
Glucose, UA: NEGATIVE mg/dL
Ketones, ur: NEGATIVE mg/dL
Leukocytes,Ua: NEGATIVE
Nitrite: NEGATIVE
Protein, ur: NEGATIVE mg/dL
Specific Gravity, Urine: >1.046 — ABNORMAL HIGH (ref 1.005–1.030)
pH: 6 (ref 5.0–8.0)

## 2023-05-15 LAB — COMPREHENSIVE METABOLIC PANEL WITH GFR
ALT: 20 U/L (ref 0–44)
AST: 21 U/L (ref 15–41)
Albumin: 3.8 g/dL (ref 3.5–5.0)
Alkaline Phosphatase: 56 U/L (ref 38–126)
Anion gap: 10 (ref 5–15)
BUN: 12 mg/dL (ref 8–23)
CO2: 22 mmol/L (ref 22–32)
Calcium: 9.1 mg/dL (ref 8.9–10.3)
Chloride: 105 mmol/L (ref 98–111)
Creatinine, Ser: 0.68 mg/dL (ref 0.44–1.00)
GFR, Estimated: >60 mL/min (ref >60)
Glucose, Bld: 154 mg/dL — ABNORMAL HIGH (ref 70–99)
Potassium: 3.4 mmol/L — ABNORMAL LOW (ref 3.5–5.1)
Sodium: 137 mmol/L (ref 135–145)
Total Bilirubin: 0.6 mg/dL (ref 0.0–1.2)
Total Protein: 7.4 g/dL (ref 6.5–8.1)

## 2023-05-15 LAB — LIPASE, BLOOD: Lipase: 28 U/L (ref 11–51)

## 2023-05-15 LAB — MAGNESIUM: Magnesium: 2.2 mg/dL (ref 1.7–2.4)

## 2023-05-15 LAB — PHOSPHORUS: Phosphorus: 2.8 mg/dL (ref 2.5–4.6)

## 2023-05-15 MED ORDER — CARVEDILOL 12.5 MG PO TABS
12.5000 mg | ORAL_TABLET | Freq: Two times a day (BID) | ORAL | Status: DC
  Administered 2023-05-15 – 2023-05-17 (×4): 12.5 mg via ORAL
  Filled 2023-05-15 (×6): qty 1

## 2023-05-15 MED ORDER — IOHEXOL 300 MG/ML  SOLN
100.0000 mL | Freq: Once | INTRAMUSCULAR | Status: AC | PRN
  Administered 2023-05-15: 100 mL via INTRAVENOUS

## 2023-05-15 MED ORDER — ACETAMINOPHEN 650 MG RE SUPP: 650.0000 mg | Freq: Four times a day (QID) | RECTAL | Status: DC | PRN

## 2023-05-15 MED ORDER — ONDANSETRON HCL 4 MG/2ML IJ SOLN
4.0000 mg | Freq: Once | INTRAMUSCULAR | Status: AC
  Administered 2023-05-15: 4 mg via INTRAVENOUS
  Filled 2023-05-15: qty 2

## 2023-05-15 MED ORDER — VITAMIN D 25 MCG (1000 UNIT) PO TABS
1000.0000 [IU] | ORAL_TABLET | Freq: Every day | ORAL | Status: DC
  Administered 2023-05-15 – 2023-05-18 (×4): 1000 [IU] via ORAL
  Filled 2023-05-15 (×4): qty 1

## 2023-05-15 MED ORDER — PROCHLORPERAZINE EDISYLATE 10 MG/2ML IJ SOLN: 10.0000 mg | Freq: Four times a day (QID) | INTRAMUSCULAR | Status: DC | PRN

## 2023-05-15 MED ORDER — ONDANSETRON HCL 4 MG PO TABS: 4.0000 mg | ORAL_TABLET | Freq: Four times a day (QID) | ORAL | Status: DC | PRN

## 2023-05-15 MED ORDER — POTASSIUM CHLORIDE CRYS ER 20 MEQ PO TBCR
40.0000 meq | EXTENDED_RELEASE_TABLET | Freq: Once | ORAL | Status: AC
  Administered 2023-05-15: 40 meq via ORAL
  Filled 2023-05-15: qty 2

## 2023-05-15 MED ORDER — PANTOPRAZOLE SODIUM 20 MG PO TBEC
20.0000 mg | DELAYED_RELEASE_TABLET | Freq: Every day | ORAL | Status: DC
  Administered 2023-05-16 – 2023-05-18 (×3): 20 mg via ORAL
  Filled 2023-05-15 (×3): qty 1

## 2023-05-15 MED ORDER — APIXABAN 5 MG PO TABS
5.0000 mg | ORAL_TABLET | Freq: Two times a day (BID) | ORAL | Status: DC
  Administered 2023-05-15 – 2023-05-18 (×7): 5 mg via ORAL
  Filled 2023-05-15 (×7): qty 1

## 2023-05-15 MED ORDER — ROSUVASTATIN CALCIUM 10 MG PO TABS
20.0000 mg | ORAL_TABLET | Freq: Every day | ORAL | Status: DC
  Administered 2023-05-15 – 2023-05-18 (×4): 20 mg via ORAL
  Filled 2023-05-15 (×4): qty 2

## 2023-05-15 MED ORDER — AMLODIPINE BESYLATE 5 MG PO TABS
5.0000 mg | ORAL_TABLET | Freq: Two times a day (BID) | ORAL | Status: DC
  Administered 2023-05-15 – 2023-05-18 (×7): 5 mg via ORAL
  Filled 2023-05-15 (×7): qty 1

## 2023-05-15 MED ORDER — PANTOPRAZOLE SODIUM 40 MG IV SOLR
40.0000 mg | Freq: Once | INTRAVENOUS | Status: AC
  Administered 2023-05-15: 40 mg via INTRAVENOUS
  Filled 2023-05-15: qty 10

## 2023-05-15 MED ORDER — ACETAMINOPHEN 325 MG PO TABS
650.0000 mg | ORAL_TABLET | Freq: Four times a day (QID) | ORAL | Status: DC | PRN
  Administered 2023-05-15 – 2023-05-17 (×4): 650 mg via ORAL
  Filled 2023-05-15 (×4): qty 2

## 2023-05-15 MED ORDER — POTASSIUM CHLORIDE IN NACL 20-0.45 MEQ/L-% IV SOLN
INTRAVENOUS | Status: AC
  Filled 2023-05-15: qty 1000

## 2023-05-15 MED ORDER — OXYCODONE HCL 5 MG PO TABS
5.0000 mg | ORAL_TABLET | ORAL | Status: DC | PRN
  Administered 2023-05-15 – 2023-05-17 (×5): 5 mg via ORAL
  Filled 2023-05-15 (×5): qty 1

## 2023-05-15 MED ORDER — FENTANYL CITRATE PF 50 MCG/ML IJ SOSY
50.0000 ug | PREFILLED_SYRINGE | Freq: Once | INTRAMUSCULAR | Status: AC
  Administered 2023-05-15: 50 ug via INTRAVENOUS
  Filled 2023-05-15: qty 1

## 2023-05-15 MED ORDER — ONDANSETRON HCL 4 MG/2ML IJ SOLN
4.0000 mg | Freq: Four times a day (QID) | INTRAMUSCULAR | Status: DC | PRN
Start: 2023-05-15 — End: 2023-05-18

## 2023-05-15 NOTE — ED Triage Notes (Signed)
 BIB [facility] from home w/ c/o abd pain since yesterday after eating shrimp. C/o bloating. More so on right side. Denies N/V/D. Pain 7/10.

## 2023-05-15 NOTE — Plan of Care (Signed)

## 2023-05-15 NOTE — H&P (Signed)
 History and Physical    Patient: [name] NFA:[id number] DOB: [birth year] DOA: 05/15/2023 DOS: the patient was seen and examined on 05/15/2023 PCP: Emilio Aspen, MD  Patient coming from: Home  Chief Complaint:  Chief Complaint  Patient presents with   Abdominal Pain   HPI: ANEEKA French is a 78 y.o. female with medical history significant of anxiety, osteoarthritis, chronic back pain, bilateral lower extremity edema, unspecified food allergy, GERD, hyperlipidemia, hypertension, knee pain, left bundle branch block, osteoarthritis, prediabetes, questionable sleep apnea, spinal stenosis, vitamin D deficiency, pulmonary embolism on apixaban, who presented to the emergency department with complaints of RLQ/RUQ abdominal pain, nausea and diarrhea.  She had a temperature of 99 at home and slightly higher with EMS.  She also has periods of constipation.  No travel history or sick contacts.    No melena or hematochezia.  No flank pain, dysuria, frequency or hematuria. Denied chills, rhinorrhea, sore throat, wheezing or hemoptysis.  No chest pain, palpitations, diaphoresis, PND, orthopnea or pitting edema of the lower extremities. No polyuria, polydipsia, polyphagia or blurred vision.   Lab work: CBC and lipase were normal.  CMP showed a potassium 3.4 mmol/L with a glucose of 154 mg/dL, but otherwise unremarkable.  Imaging: CT abdomen/pelvis with contrast shows scattered small bowel in the abnormal but nonspecific simple definite free fluid in the abdomen and pelvis especially along the distal small bowel mesenteric and the pelvic inlet.  Stents of diverticulosis of the distal large bowel, nondilated with fluid-filled and mildly hyperenhancing distal small bowel loops favoring acute enterocolitis.  Acute diverticulitis is not excluded.  There is inflammation and free fluid near the appendix, but no strong evidence of acute appendicitis.  No evidence of bowel obstruction or perforation.  And all  other abdominal viscera.  Negative.  Absent uterus, absent or diminutive ovaries.  There was aortic atherosclerosis.   ED course: Initial vital signs were temperature 98.7 F, pulse 74, respirations 16, BP 184/83 mmHg O2 sat 100% on room air.  The patient received fentanyl 50 mcg IVP x 2, ondansetron 4 mg IVP and pantoprazole 40 mg IVP.  Review of Systems: As mentioned in the history of present illness. All other systems reviewed and are negative. Past Medical History:  Diagnosis Date   Anxiety    Arthritis    Back pain    Chronic pain    Edema of both lower extremities    Fluid retention    Food allergy    GERD (gastroesophageal reflux disease)    Hyperlipidemia    Hypertension    Knee pain    LBBB (left bundle branch block)    Osteoarthritis    Pre-diabetes    Shoulder pain    Sleep apnea    possibly   Spinal stenosis    Vitamin D deficiency    Past Surgical History:  Procedure Laterality Date   ABDOMINAL HYSTERECTOMY  1970's   ABDOMINAL HYSTERECTOMY     APPENDECTOMY  1970's   with hysterectomy   APPENDECTOMY     with hysterectomy   COLONOSCOPY     COLONOSCOPY WITH PROPOFOL N/A 10/04/2013   Procedure: COLONOSCOPY WITH PROPOFOL;  Surgeon: Charolett Bumpers, MD;  Location: [facility];  Service: Endoscopy;  Laterality: N/A;   OOPHORECTOMY     TOTAL HIP ARTHROPLASTY  04/04/2011   Procedure: TOTAL HIP ARTHROPLASTY ANTERIOR APPROACH;  Surgeon: [name], MD;  Location: [facility];  Service: Orthopedics;  Laterality: Right;   TOTAL HIP ARTHROPLASTY  Social History:  reports that she quit smoking about 11 years ago. Her smoking use included cigarettes. She started smoking about 32 years ago. She has a 20 pack-year smoking history. She has never used smokeless tobacco. She reports that she does not drink alcohol and does not use drugs.  Allergies  Allergen Reactions   Kiwi Extract Itching   Meloxicam Other (See Comments)    Hypertension   Pineapple Itching     Family History  Problem Relation Age of Onset   Hyperlipidemia Mother    Hypertension Mother    Heart disease Mother    Diabetes Mother    Obesity Mother    Kidney disease Mother    Stroke Father    Hyperlipidemia Father    Hypertension Father    Heart disease Father    Heart attack Father    High Cholesterol Father    Depression Father    Anxiety disorder Father    Alcoholism Father    Prostate cancer Brother    Heart attack Brother    Heart disease Brother     Prior to Admission medications   Medication Sig Start Date End Date Taking? Authorizing Provider  acetaminophen (TYLENOL) 650 MG CR tablet Take 650 mg by mouth every 8 (eight) hours as needed for pain.   Yes [provider]  amLODipine (NORVASC) 5 MG tablet Take 5 mg by mouth 2 (two) times daily.   Yes [provider]  carvedilol (COREG) 12.5 MG tablet Take 1 tablet (12.5 mg total) by mouth 2 (two) times daily with a meal. 06/18/14  Yes Barrett, Joline Salt, PA-C  cholecalciferol (VITAMIN D) 25 MCG (1000 UNIT) tablet Take 1,000 Units by mouth daily.   Yes [provider]  ELIQUIS 5 MG TABS tablet Take 5 mg by mouth 2 (two) times daily.   Yes [provider]  furosemide (LASIX) 20 MG tablet Take 1 tablet (20 mg total) by mouth 3 (three) times a week. Take Mondays, Wednesdays, Fridays. 02/13/23 05/15/23 Yes Alver Sorrow, NP  HYDROcodone-acetaminophen (NORCO/VICODIN) 5-325 MG tablet Take 1 tablet by mouth every 6 (six) hours as needed. 04/20/23  Yes Plunkett, Alphonzo Lemmings, MD  pantoprazole (PROTONIX) 20 MG tablet Take 1 tablet (20 mg total) by mouth daily. 04/20/23  Yes Plunkett, Alphonzo Lemmings, MD  rosuvastatin (CRESTOR) 20 MG tablet Take 1 tablet (20 mg total) by mouth daily. 11/11/22  Yes Alver Sorrow, NP  APIXABAN Everlene Balls) VTE STARTER PACK (10MG  AND 5MG ) Take as directed on package: start with two-5mg  tablets twice daily for 7 days. On day 8, switch to one-5mg  tablet twice daily. 01/12/23   Jerald Kief, MD  Ascorbic Acid (VITAMIN C) 1000 MG tablet Take 1,000 mg by mouth daily. Patient not taking: Reported on 05/15/2023    [provider]  famotidine (PEPCID) 20 MG tablet Take 20 mg by mouth at bedtime as needed for heartburn or indigestion. Patient not taking: Reported on 05/15/2023 01/19/23   [provider]  lisinopril (ZESTRIL) 20 MG tablet Take 40 mg by mouth daily. Patient not taking: Reported on 05/15/2023 03/17/23   [provider]  Multiple Vitamins-Minerals (CENTRUM SILVER PO) Take by mouth. Patient not taking: Reported on 05/15/2023    [provider]  Vitamin D, Ergocalciferol, (DRISDOL) 1.25 MG (50000 UNIT) CAPS capsule Take 1 capsule (50,000 Units total) by mouth every 7 (seven) days. Patient not taking: Reported on 01/09/2023 01/06/23   Langston Reusing, MD    Physical Exam: Vitals:  05/15/23 0219 05/15/23 0345 05/15/23 0515 05/15/23 0917  BP: (!) 184/83 (!) 184/74 (!) 170/84 116/60  Pulse: 74 63 60 (!) 56  Resp: 16 16 17 17   Temp: 98.7 F (37.1 C)  98.7 F (37.1 C) 98.5 F (36.9 C)  TempSrc: Oral     SpO2: 100% 95% 96% 97%   Physical Exam Vitals reviewed.  Constitutional:      General: She is awake. She is not in acute distress.    Appearance: She is ill-appearing.  HENT:     Head: Normocephalic.     Nose: No rhinorrhea.     Mouth/Throat:     Mouth: Mucous membranes are dry.  Eyes:     General: No scleral icterus.    Pupils: Pupils are equal, round, and reactive to light.  Neck:     Vascular: No JVD.  Cardiovascular:     Rate and Rhythm: Normal rate and regular rhythm.     Heart sounds: S1 normal and S2 normal.  Pulmonary:     Effort: Pulmonary effort is normal.     Breath sounds: Normal breath sounds. No wheezing, rhonchi or rales.  Abdominal:     General: Bowel sounds are normal.     Palpations: Abdomen is soft.     Tenderness: There is abdominal tenderness. There is no right CVA tenderness, left CVA  tenderness, guarding or rebound.  Musculoskeletal:     Cervical back: Neck supple.     Right lower leg: No edema.     Left lower leg: No edema.  Skin:    General: Skin is warm and dry.  Neurological:     General: No focal deficit present.     Mental Status: She is alert and oriented to person, place, and time.  Psychiatric:        Mood and Affect: Mood normal.        Behavior: Behavior normal. Behavior is cooperative.    Data Reviewed:  Results are pending, will review when available.  Assessment and Plan: Principal Problem:   Enterocolitis Observation/MedSurg. Gentle IV hydration. Full liquid diet. Analgesics as needed. Antiemetics as needed. Pantoprazole 40 mg IVP daily. Follow CBC, CMP and lipase in AM.  Active Problems:   Hypertension Continue amlodipine 5 mg p.o. twice daily. Continue carvedilol 12.5 mg p.o. twice daily.    Hyperlipidemia Continue rosuvastatin 20 mg p.o. daily.    Prediabetes With associated:   Hyperglycemia Check fasting glucose in AM. Last hemoglobin A1c 6.2% 3 months ago.    Sleep apnea CPAP at bedtime.    Pulmonary embolism without acute cor pulmonale (HCC) Continue apixaban 5 mg p.o. twice daily.    Hypokalemia Replacement ordered. Will check potassium level in AM.    Vitamin D deficiency On vitamin D replacement at home.   Advance Care Planning:   Code Status: Full Code   Consults:   Family Communication:   Severity of Illness: The appropriate patient status for this patient is OBSERVATION. Observation status is judged to be reasonable and necessary in order to provide the required intensity of service to ensure the patient's safety. The patient's presenting symptoms, physical exam findings, and initial radiographic and laboratory data in the context of their medical condition is felt to place them at decreased risk for further clinical deterioration. Furthermore, it is anticipated that the patient will be medically stable  for discharge from the hospital within 2 midnights of admission.   Author: Bobette Mo, MD 05/15/2023 9:57 AM  For on  call review [email or web address].   This document was prepared using Dragon voice recognition software and may contain some unintended transcription errors.

## 2023-05-15 NOTE — Progress Notes (Signed)
   05/15/23 2218  BiPAP/CPAP/SIPAP  $ Non-Invasive Home Ventilator  Initial  BiPAP/CPAP/SIPAP Pt Type Adult  BiPAP/CPAP/SIPAP Resmed  Mask Type Nasal mask  Dentures removed? Not applicable  Mask Size Small  EPAP 4 cmH2O  FiO2 (%) 21 %  Patient Home Mask No  Patient Home Tubing No  Auto Titrate No  Device Plugged into RED Power Outlet Yes  BiPAP/CPAP /SiPAP Vitals  Resp 16  MEWS Score/Color  MEWS Score 0  MEWS Score Color Green

## 2023-05-15 NOTE — ED Provider Notes (Signed)
 [place] EMERGENCY DEPARTMENT AT [facility] Provider Note   CSN: [id number] Arrival date & time: 05/15/23  4098     History  Chief Complaint  Patient presents with   Abdominal Pain    [name] is a 78 y.o. female.  The history is provided by the patient.  Abdominal Pain [name] is a 78 y.o. female who presents to the Emergency Department complaining of abdominal pain.  She presents to the emergency department for evaluation of right-sided abdominal pain and bloating since yesterday morning.  She has associated nausea with a temperature to 99 at home.  No vomiting, dysuria.  She does report intermittent diarrhea for an ongoing period of time.  No hematochezia or melena.  No chest pain, difficulty breathing.  Symptoms started after eating shrimp, and she wonders if this was a contributing factor to her symptoms.  She does have a history of PE and is on Eliquis.  Also has a history of hypertension.  She is status post hysterectomy oophorectomy.  No additional abdominal surgeries.   Home Medications Prior to Admission medications   Medication Sig Start Date End Date Taking? Authorizing Provider  acetaminophen (TYLENOL) 650 MG CR tablet Take 650 mg by mouth every 8 (eight) hours as needed for pain.   Yes [provider]  amLODipine (NORVASC) 5 MG tablet Take 5 mg by mouth 2 (two) times daily.   Yes [provider]  carvedilol (COREG) 12.5 MG tablet Take 1 tablet (12.5 mg total) by mouth 2 (two) times daily with a meal. 06/18/14  Yes Barrett, Joline Salt, PA-C  cholecalciferol (VITAMIN D) 25 MCG (1000 UNIT) tablet Take 1,000 Units by mouth daily.   Yes [provider]  ELIQUIS 5 MG TABS tablet Take 5 mg by mouth 2 (two) times daily.   Yes [provider]  furosemide (LASIX) 20 MG tablet Take 1 tablet (20 mg total) by mouth 3 (three) times a week. Take Mondays, Wednesdays, Fridays. 02/13/23 05/15/23 Yes Alver Sorrow, NP   HYDROcodone-acetaminophen (NORCO/VICODIN) 5-325 MG tablet Take 1 tablet by mouth every 6 (six) hours as needed. 04/20/23  Yes Plunkett, Alphonzo Lemmings, MD  pantoprazole (PROTONIX) 20 MG tablet Take 1 tablet (20 mg total) by mouth daily. 04/20/23  Yes Plunkett, Alphonzo Lemmings, MD  rosuvastatin (CRESTOR) 20 MG tablet Take 1 tablet (20 mg total) by mouth daily. 11/11/22  Yes Alver Sorrow, NP  APIXABAN Everlene Balls) VTE STARTER PACK (10MG  AND 5MG ) Take as directed on package: start with two-5mg  tablets twice daily for 7 days. On day 8, switch to one-5mg  tablet twice daily. 01/12/23   Jerald Kief, MD  Ascorbic Acid (VITAMIN C) 1000 MG tablet Take 1,000 mg by mouth daily. Patient not taking: Reported on 05/15/2023    [provider]  famotidine (PEPCID) 20 MG tablet Take 20 mg by mouth at bedtime as needed for heartburn or indigestion. Patient not taking: Reported on 05/15/2023 01/19/23   [provider]  lisinopril (ZESTRIL) 20 MG tablet Take 40 mg by mouth daily. Patient not taking: Reported on 05/15/2023 03/17/23   [provider]  Multiple Vitamins-Minerals (CENTRUM SILVER PO) Take by mouth. Patient not taking: Reported on 05/15/2023    [provider]  Vitamin D, Ergocalciferol, (DRISDOL) 1.25 MG (50000 UNIT) CAPS capsule Take 1 capsule (50,000 Units total) by mouth every 7 (seven) days. Patient not taking: Reported on 01/09/2023 01/06/23   Langston Reusing, MD      Allergies  Kiwi extract, Meloxicam, and Pineapple    Review of Systems   Review of Systems  Gastrointestinal:  Positive for abdominal pain.  All other systems reviewed and are negative.   Physical Exam Updated Vital Signs BP (!) 170/84   Pulse 60   Temp 98.7 F (37.1 C)   Resp 17   SpO2 96%  Physical Exam Vitals and nursing note reviewed.  Constitutional:      Appearance: She is well-developed.  HENT:     Head: Normocephalic and atraumatic.  Cardiovascular:     Rate and Rhythm: Normal rate and  regular rhythm.  Pulmonary:     Effort: Pulmonary effort is normal. No respiratory distress.  Abdominal:     Palpations: Abdomen is soft.     Tenderness: There is no guarding or rebound.     Comments: Mild right sided abdominal tenderness  Musculoskeletal:        General: No tenderness.     Comments: 1+ pitting edema to ble  Skin:    General: Skin is warm and dry.  Neurological:     Mental Status: She is alert and oriented to person, place, and time.  Psychiatric:        Behavior: Behavior normal.     ED Results / Procedures / Treatments   Labs (all labs ordered are listed, but only abnormal results are displayed) Labs Reviewed  COMPREHENSIVE METABOLIC PANEL WITH GFR - Abnormal; Notable for the following components:      Result Value   Potassium 3.4 (*)    Glucose, Bld 154 (*)    All other components within normal limits  GASTROINTESTINAL PANEL BY PCR, STOOL (REPLACES STOOL CULTURE)  LIPASE, BLOOD  CBC WITH DIFFERENTIAL/PLATELET  URINALYSIS, ROUTINE W REFLEX MICROSCOPIC    EKG None  Radiology CT ABDOMEN PELVIS W CONTRAST Result Date: 05/15/2023 CLINICAL DATA:  78 year old female with right lower quadrant, abdominal pain for 2 days. EXAM: CT ABDOMEN AND PELVIS WITH CONTRAST TECHNIQUE: Multidetector CT imaging of the abdomen and pelvis was performed using the standard protocol following bolus administration of intravenous contrast. RADIATION DOSE REDUCTION: This exam was performed according to the departmental dose-optimization program which includes automated exposure control, adjustment of the mA and/or kV according to patient size and/or use of iterative reconstruction technique. CONTRAST:  OMNIPAQUE IOHEXOL 300 MG/ML  SOLN COMPARISON:  CTA chest 04/20/2023. FINDINGS: Lower chest: Negative. Hepatobiliary: Liver and gallbladder are within normal limits, but there is trace perihepatic free fluid with simple fluid density (series 2, image 10. Pancreas: Negative. Spleen:  Trace perisplenic free fluid (series 2, image 16), simple fluid density. But otherwise the spleen appears normal, nonenlarged. Adrenals/Urinary Tract: Normal adrenal glands. Kidneys appear symmetric and normal. Normal renal contrast enhancement and excretion to diminutive ureters. Occasional tiny renal cysts (no follow-up imaging recommended). Streak artifact in the pelvis from right hip arthroplasty. Mildly distended but otherwise unremarkable urinary bladder. Stomach/Bowel: Rectum and distal sigmoid colon appear negative. Severe diverticulosis throughout the proximal sigmoid and also the descending colon. Indistinct appearance of the sigmoid, with nearby small volume of free fluid (series 2, image 60). And on coronal image 100 suggestion of mild inflammatory stranding along the distal descending colon. Nondilated upstream large bowel. Additional transverse colon diverticulosis. Mild retained stool. Fluid in the right colon. There are numerous gonadal vein phleboliths near the appendix. Mild soft tissue stranding in the region such as on coronal image 99. But appendix seems to remain normal on series 7, image 95 and series 2,  image 52. Fluid containing nondilated small bowel loops throughout the lower abdomen and including the distal ileum appear to be mildly hyperenhancing such as on coronal image 108, and there is a small volume of free fluid in the distal small bowel mesentery (coronal images 92 and 78) bilaterally. No dilated small bowel. Decompressed stomach and duodenum. No pneumoperitoneum. No free fluid identified in the upper abdomen. Vascular/Lymphatic: Aortoiliac calcified atherosclerosis. Major arterial structures remain patent. Normal caliber abdominal aorta. Portal venous system is patent. Reproductive: Absent uterus.  Diminutive or absent ovaries. Other: There is no layering free fluid in the pelvis. But there is a small volume of simple density free fluid at the left pelvic inlet which seems to be  in the peritoneal cavity series 2, image 60. Musculoskeletal: Advanced spinal degeneration. Widespread vacuum disc, endplate spurring. Lower lumbar grade 1 spondylolisthesis with bulky facet arthropathy. Bilateral SI joint degeneration. Right hip arthroplasty. No acute osseous abnormality identified. IMPRESSION: 1. Scattered small volume of abnormal but nonspecific simple density free fluid in the abdomen and pelvis, especially amongst distal small bowel mesentery and at the pelvic inlet. Extensive diverticulosis of the distal large bowel, and also nondilated but fluid-filled and mildly hyperenhancing distal small bowel loops. Overall favor Acute Enterocolitis. Acute Diverticulitis also not excluded. There is inflammation and free fluid near the appendix, but no strong evidence of acute appendicitis at this time. If the clinical course is equivocal then repeat CT Abdomen and Pelvis with both oral and IV contrast is recommended in 24 hours. 2. No evidence of bowel obstruction or perforation. And other abdominal viscera appear negative. 3.  Aortic Atherosclerosis (ICD10-I70.0). Electronically Signed   By: Odessa Fleming M.D.   On: 05/15/2023 06:09    Procedures Procedures    Medications Ordered in ED Medications  ondansetron (ZOFRAN) injection 4 mg (4 mg Intravenous Given 05/15/23 0257)  fentaNYL (SUBLIMAZE) injection 50 mcg (50 mcg Intravenous Given 05/15/23 0257)  iohexol (OMNIPAQUE) 300 MG/ML solution 100 mL (100 mLs Intravenous Contrast Given 05/15/23 0542)  fentaNYL (SUBLIMAZE) injection 50 mcg (50 mcg Intravenous Given 05/15/23 0735)    ED Course/ Medical Decision Making/ A&P                                 Medical Decision Making Amount and/or Complexity of Data Reviewed Labs: ordered. Radiology: ordered.  Risk Prescription drug management. Decision regarding hospitalization.   Patient here for evaluation of right-sided abdominal pain for the last 24 hours.  She does have tenderness on examination  without peritoneal findings.  Labs are reassuring.  CT abdomen pelvis does demonstrate some free fluid, cannot rule out colitis versus early appendicitis versus diverticulitis.  Patient does have recurrent pain on exam, will provide additional pain medication.  Given her age, persistent pain plan to admit for observation, may need repeat CT scan if her symptoms progress.  Hospitalist consulted for admission for ongoing care.        Final Clinical Impression(s) / ED Diagnoses Final diagnoses:  Right lower quadrant abdominal pain  Colitis    Rx / DC Orders ED Discharge Orders     None         Tilden Fossa, MD 05/15/23 [phone]

## 2023-05-16 DIAGNOSIS — R002 Palpitations: Secondary | ICD-10-CM | POA: Diagnosis not present

## 2023-05-16 DIAGNOSIS — K529 Noninfective gastroenteritis and colitis, unspecified: Secondary | ICD-10-CM | POA: Diagnosis not present

## 2023-05-16 LAB — COMPREHENSIVE METABOLIC PANEL WITH GFR
ALT: 18 U/L (ref 0–44)
AST: 19 U/L (ref 15–41)
Albumin: 3.9 g/dL (ref 3.5–5.0)
Alkaline Phosphatase: 54 U/L (ref 38–126)
Anion gap: 9 (ref 5–15)
BUN: 7 mg/dL — ABNORMAL LOW (ref 8–23)
CO2: 25 mmol/L (ref 22–32)
Calcium: 9.5 mg/dL (ref 8.9–10.3)
Chloride: 105 mmol/L (ref 98–111)
Creatinine, Ser: 0.85 mg/dL (ref 0.44–1.00)
GFR, Estimated: >60 mL/min (ref >60)
Glucose, Bld: 113 mg/dL — ABNORMAL HIGH (ref 70–99)
Potassium: 3.9 mmol/L (ref 3.5–5.1)
Sodium: 139 mmol/L (ref 135–145)
Total Bilirubin: 0.7 mg/dL (ref 0.0–1.2)
Total Protein: 7.4 g/dL (ref 6.5–8.1)

## 2023-05-16 LAB — CBC
HCT: 43.5 % (ref 36.0–46.0)
Hemoglobin: 14.1 g/dL (ref 12.0–15.0)
MCH: 32 pg (ref 26.0–34.0)
MCHC: 32.4 g/dL (ref 30.0–36.0)
MCV: 98.6 fL (ref 80.0–100.0)
Platelets: 344 10*3/uL (ref 150–400)
RBC: 4.41 MIL/uL (ref 3.87–5.11)
RDW: 13.4 % (ref 11.5–15.5)
WBC: 4.9 10*3/uL (ref 4.0–10.5)
nRBC: 0 % (ref 0.0–0.2)

## 2023-05-16 MED ORDER — SODIUM CHLORIDE 0.9 % IV SOLN
2.0000 g | INTRAVENOUS | Status: DC
  Administered 2023-05-16 – 2023-05-17 (×2): 2 g via INTRAVENOUS
  Filled 2023-05-16 (×3): qty 20

## 2023-05-16 MED ORDER — METRONIDAZOLE 500 MG/100ML IV SOLN
500.0000 mg | Freq: Two times a day (BID) | INTRAVENOUS | Status: DC
  Administered 2023-05-16 – 2023-05-17 (×4): 500 mg via INTRAVENOUS
  Filled 2023-05-16 (×5): qty 100

## 2023-05-16 NOTE — Progress Notes (Signed)
 PROGRESS NOTE  [name]  ZOX:[id number] DOB: 11/24/1945 DOA: 05/15/2023 PCP: Emilio Aspen, MD   Brief Narrative: Patient is a 78 year old female with history of anxiety, osteoarthritis, chronic back pain, GERD, hyperlipidemia, hypertension, PE on Eliquis who presented with complaint of right lower quadrant/right upper quadrant abdominal pain, nausea, diarrhea.  On presentation, she was hemodynamically stable.  Lab work showed potassium of 3.4.  CT abdomen/pelvis showed diverticulitis, nondilated fluid-filled, mildly hyperenhancing distal small bowel loops favoring for acute enterocolitis.  Acute diverticulitis not excluded.  Patient admitted for the management of enterocolitis.  GI pathogen panel pending. no Fever leukocytosis  Assessment & Plan:  Principal Problem:   Enterocolitis Active Problems:   Hypertension   Hyperlipidemia   Prediabetes   Vitamin D deficiency   Hyperglycemia   Sleep apnea   Pulmonary embolism without acute cor pulmonale (HCC)   Hypokalemia  Enterocolitis: Presented with abdominal pain, nausea, diarrhea.  Diarrhea has stopped.  No fever or leukocytosis.  CT abdomen/pelvis as above.  Continue ceftriaxone and Flagyl Check GI pathogen panel.No bowel movement after admission CT also showed inflammation and free fluid near the appendix, but no strong evidence of acute appendicitis at this time.  Hypertension: Continue amlodipine, carvedilol.  Monitor blood pressure  Hyperlipidemia: On Lipitor  Prediabetes: A1c of 6.2 estimated 3 months ago  Sleep apnea: On CPAP  PE: On Eliquis  Hypokalemia: Supplemented and corrected         DVT prophylaxis: apixaban (ELIQUIS) tablet 5 mg     Code Status: Full Code  Family Communication: None at bedside  Patient status:Inpatient  Patient is from :Home  Anticipated discharge WJ:XBJY  Estimated DC date:tomorrow   Consultants: GI  Procedures:None  Antimicrobials:  Anti-infectives (From  admission, onward)    None       Subjective: Patient seen and examined at bedside today.  Hemodynamically stable.  Comfortable.lying on bed.  No abdominal pain, nausea or vomiting.  Bowel sounds present.  No bowel movement for last 2 days.  Objective: Vitals:   05/15/23 2218 05/15/23 2343 05/16/23 0300 05/16/23 0420  BP:  130/65  138/70  Pulse:  (!) 50  (!) 59  Resp: 16 16 16 16   Temp:  (!) 97.4 F (36.3 C)  97.9 F (36.6 C)  TempSrc:  Oral    SpO2:  97%  96%  Weight:      Height:        Intake/Output Summary (Last 24 hours) at 05/16/2023 7829 Last data filed at 05/15/2023 2118 Gross per 24 hour  Intake 1360.05 ml  Output --  Net 1360.05 ml   Filed Weights   05/15/23 2117  Weight: 117.9 kg    Examination:  General exam: Overall comfortable, not in distress,obese HEENT: PERRL Respiratory system:  no wheezes or crackles  Cardiovascular system: S1 & S2 heard, RRR.  Gastrointestinal system: Abdomen is nondistended, soft and nontender. Central nervous system: Alert and oriented Extremities: No edema, no clubbing ,no cyanosis Skin: No rashes, no ulcers,no icterus     Data Reviewed: I have personally reviewed following labs and imaging studies  CBC: Recent Labs  Lab 05/15/23 0248 05/16/23 0602  WBC 7.7 4.9  NEUTROABS 5.6  --   HGB 13.7 14.1  HCT 40.9 43.5  MCV 94.0 98.6  PLT 309 344   Basic Metabolic Panel: Recent Labs  Lab 05/15/23 0247 05/15/23 0248 05/16/23 0602  NA  --  137 139  K  --  3.4* 3.9  CL  --  105 105  CO2  --  22 25  GLUCOSE  --  154* 113*  BUN  --  12 7*  CREATININE  --  0.68 0.85  CALCIUM  --  9.1 9.5  MG 2.2  --   --   PHOS 2.8  --   --      No results found for this or any previous visit (from the past 240 hours).   Radiology Studies: CT ABDOMEN PELVIS W CONTRAST Result Date: 05/15/2023 CLINICAL DATA:  78 year old female with right lower quadrant, abdominal pain for 2 days. EXAM: CT ABDOMEN AND PELVIS WITH CONTRAST  TECHNIQUE: Multidetector CT imaging of the abdomen and pelvis was performed using the standard protocol following bolus administration of intravenous contrast. RADIATION DOSE REDUCTION: This exam was performed according to the departmental dose-optimization program which includes automated exposure control, adjustment of the mA and/or kV according to patient size and/or use of iterative reconstruction technique. CONTRAST:  OMNIPAQUE IOHEXOL 300 MG/ML  SOLN COMPARISON:  CTA chest 04/20/2023. FINDINGS: Lower chest: Negative. Hepatobiliary: Liver and gallbladder are within normal limits, but there is trace perihepatic free fluid with simple fluid density (series 2, image 10. Pancreas: Negative. Spleen: Trace perisplenic free fluid (series 2, image 16), simple fluid density. But otherwise the spleen appears normal, nonenlarged. Adrenals/Urinary Tract: Normal adrenal glands. Kidneys appear symmetric and normal. Normal renal contrast enhancement and excretion to diminutive ureters. Occasional tiny renal cysts (no follow-up imaging recommended). Streak artifact in the pelvis from right hip arthroplasty. Mildly distended but otherwise unremarkable urinary bladder. Stomach/Bowel: Rectum and distal sigmoid colon appear negative. Severe diverticulosis throughout the proximal sigmoid and also the descending colon. Indistinct appearance of the sigmoid, with nearby small volume of free fluid (series 2, image 60). And on coronal image 100 suggestion of mild inflammatory stranding along the distal descending colon. Nondilated upstream large bowel. Additional transverse colon diverticulosis. Mild retained stool. Fluid in the right colon. There are numerous gonadal vein phleboliths near the appendix. Mild soft tissue stranding in the region such as on coronal image 99. But appendix seems to remain normal on series 7, image 95 and series 2, image 52. Fluid containing nondilated small bowel loops throughout the lower abdomen and  including the distal ileum appear to be mildly hyperenhancing such as on coronal image 108, and there is a small volume of free fluid in the distal small bowel mesentery (coronal images 92 and 78) bilaterally. No dilated small bowel. Decompressed stomach and duodenum. No pneumoperitoneum. No free fluid identified in the upper abdomen. Vascular/Lymphatic: Aortoiliac calcified atherosclerosis. Major arterial structures remain patent. Normal caliber abdominal aorta. Portal venous system is patent. Reproductive: Absent uterus.  Diminutive or absent ovaries. Other: There is no layering free fluid in the pelvis. But there is a small volume of simple density free fluid at the left pelvic inlet which seems to be in the peritoneal cavity series 2, image 60. Musculoskeletal: Advanced spinal degeneration. Widespread vacuum disc, endplate spurring. Lower lumbar grade 1 spondylolisthesis with bulky facet arthropathy. Bilateral SI joint degeneration. Right hip arthroplasty. No acute osseous abnormality identified. IMPRESSION: 1. Scattered small volume of abnormal but nonspecific simple density free fluid in the abdomen and pelvis, especially amongst distal small bowel mesentery and at the pelvic inlet. Extensive diverticulosis of the distal large bowel, and also nondilated but fluid-filled and mildly hyperenhancing distal small bowel loops. Overall favor Acute Enterocolitis. Acute Diverticulitis also not excluded. There is inflammation and free fluid near the appendix, but no strong  evidence of acute appendicitis at this time. If the clinical course is equivocal then repeat CT Abdomen and Pelvis with both oral and IV contrast is recommended in 24 hours. 2. No evidence of bowel obstruction or perforation. And other abdominal viscera appear negative. 3.  Aortic Atherosclerosis (ICD10-I70.0). Electronically Signed   By: Odessa Fleming M.D.   On: 05/15/2023 06:09    Scheduled Meds:  amLODipine  5 mg Oral BID   apixaban  5 mg Oral BID    carvedilol  12.5 mg Oral BID WC   cholecalciferol  1,000 Units Oral Daily   pantoprazole  20 mg Oral Daily   rosuvastatin  20 mg Oral Daily   Continuous Infusions:   LOS: 0 days   Burnadette Pop, MD [facility] P4/06/2023, 8:22 AM

## 2023-05-16 NOTE — Care Management Obs Status (Signed)
 MEDICARE OBSERVATION STATUS NOTIFICATION   Patient Details  Name: [name] MRN: [id number] Date of Birth: [birth year]   Medicare Observation Status Notification Given:  Yes    Lewie Chamber, LCSW 05/16/2023, 11:04 AM

## 2023-05-16 NOTE — Consult Note (Signed)
 [facility] Consultation Note  Referring Provider: [facility] Primary Care Physician:  Emilio Aspen, MD Primary Gastroenterologist:  [facility]  Reason for Consultation:  Abdominal pain  HPI: [name] is a 78 y.o. female acute onset abdominal pain a couple days ago.  Scan with below results.  Has had some intermittent diarrhea for past couple weeks.  No nausea, vomiting, sick contacts, blood in stool.  Pain has significantly improved, but not resolved, since her hospitalization.   Past Medical History:  Diagnosis Date   Anxiety    Arthritis    Back pain    Chronic pain    Edema of both lower extremities    Fluid retention    Food allergy    GERD (gastroesophageal reflux disease)    Hyperlipidemia    Hypertension    Knee pain    LBBB (left bundle branch block)    Osteoarthritis    Pre-diabetes    Shoulder pain    Sleep apnea    possibly   Spinal stenosis    Vitamin D deficiency     Past Surgical History:  Procedure Laterality Date   ABDOMINAL HYSTERECTOMY  1970's   ABDOMINAL HYSTERECTOMY     APPENDECTOMY  1970's   with hysterectomy   APPENDECTOMY     with hysterectomy   COLONOSCOPY     COLONOSCOPY WITH PROPOFOL N/A 10/04/2013   Procedure: COLONOSCOPY WITH PROPOFOL;  Surgeon: Charolett Bumpers, MD;  Location: [facility];  Service: Endoscopy;  Laterality: N/A;   OOPHORECTOMY     TOTAL HIP ARTHROPLASTY  04/04/2011   Procedure: TOTAL HIP ARTHROPLASTY ANTERIOR APPROACH;  Surgeon: [name], MD;  Location: [facility];  Service: Orthopedics;  Laterality: Right;   TOTAL HIP ARTHROPLASTY      Prior to Admission medications   Medication Sig Start Date End Date Taking? Authorizing Provider  acetaminophen (TYLENOL) 650 MG CR tablet Take 650 mg by mouth every 8 (eight) hours as needed for pain.   Yes [provider]  amLODipine (NORVASC) 5 MG tablet Take 5 mg by mouth 2 (two) times daily.   Yes [provider]  carvedilol (COREG) 12.5 MG tablet Take 1 tablet (12.5 mg total) by mouth 2 (two) times daily with a meal. 06/18/14  Yes Barrett, Joline Salt, PA-C  cholecalciferol (VITAMIN D) 25 MCG (1000 UNIT) tablet Take 1,000 Units by mouth daily.   Yes [provider]  ELIQUIS 5 MG TABS tablet Take 5 mg by mouth 2 (two) times daily.   Yes [provider]  furosemide (LASIX) 20 MG tablet Take 1 tablet (20 mg total) by mouth 3 (three) times a week. Take Mondays, Wednesdays, Fridays. 02/13/23 05/15/23 Yes Alver Sorrow, NP  HYDROcodone-acetaminophen (NORCO/VICODIN) 5-325 MG tablet Take 1 tablet by mouth every 6 (six) hours as needed. 04/20/23  Yes Plunkett, Alphonzo Lemmings, MD  pantoprazole (PROTONIX) 20 MG tablet Take 1 tablet (20 mg total) by mouth daily. 04/20/23  Yes Plunkett, Alphonzo Lemmings, MD  rosuvastatin (CRESTOR) 20 MG tablet Take 1 tablet (20 mg total) by mouth daily. 11/11/22  Yes Alver Sorrow, NP  APIXABAN Everlene Balls) VTE STARTER PACK (10MG  AND 5MG ) Take as directed on package: start with two-5mg  tablets twice daily for 7 days. On day 8, switch to one-5mg  tablet twice daily. 01/12/23   Jerald Kief, MD  Ascorbic Acid (VITAMIN C) 1000 MG tablet Take 1,000 mg by mouth daily. Patient not taking: Reported on 05/15/2023    [provider]  famotidine (PEPCID) 20 MG tablet Take 20 mg by mouth at bedtime as needed for heartburn or indigestion. Patient not taking: Reported on 05/15/2023 01/19/23   [provider]  lisinopril (ZESTRIL) 20 MG tablet Take 40 mg by mouth daily. Patient not taking: Reported on 05/15/2023 03/17/23   [provider]  Multiple Vitamins-Minerals (CENTRUM SILVER PO) Take by mouth. Patient not taking: Reported on 05/15/2023    [provider]  Vitamin D, Ergocalciferol, (DRISDOL) 1.25 MG (50000 UNIT) CAPS capsule Take 1 capsule (50,000 Units total) by mouth every 7 (seven) days. Patient not taking: Reported on 01/09/2023 01/06/23   Langston Reusing, MD    Current Facility-Administered Medications  Medication Dose Route Frequency Provider Last Rate Last Admin   acetaminophen (TYLENOL) tablet 650 mg  650 mg Oral Q6H PRN Bobette Mo, MD   650 mg at 05/16/23 8413   Or   acetaminophen (TYLENOL) suppository 650 mg  650 mg Rectal Q6H PRN Bobette Mo, MD       amLODipine [facility]) tablet 5 mg  5 mg Oral BID Bobette Mo, MD   5 mg at 05/16/23 2440   apixaban (ELIQUIS) tablet 5 mg  5 mg Oral BID Bobette Mo, MD   5 mg at 05/16/23 0857   carvedilol (COREG) tablet 12.5 mg  12.5 mg Oral BID WC Bobette Mo, MD   12.5 mg at 05/16/23 0857   cefTRIAXone (ROCEPHIN) 2 g in sodium chloride 0.9 % 100 mL IVPB  2 g Intravenous Q24H Burnadette Pop, MD 200 mL/hr at 05/16/23 0905 2 g at 05/16/23 0905   cholecalciferol (VITAMIN D3) 25 MCG (1000 UNIT) tablet 1,000 Units  1,000 Units Oral Daily Bobette Mo, MD   1,000 Units at 05/16/23 0857   metroNIDAZOLE (FLAGYL) IVPB 500 mg  500 mg Intravenous Larkin Ina, MD 100 mL/hr at 05/16/23 0958 500 mg at 05/16/23 0958   ondansetron (ZOFRAN) tablet 4 mg  4 mg Oral Q6H PRN Bobette Mo, MD       Or   ondansetron [facility]) injection 4 mg  4 mg Intravenous Q6H PRN Bobette Mo, MD       oxyCODONE (Oxy IR/ROXICODONE) immediate release tablet 5 mg  5 mg Oral Q4H PRN Bobette Mo, MD   5 mg at 05/16/23 1027   pantoprazole (PROTONIX) EC tablet 20 mg  20 mg Oral Daily Bobette Mo, MD   20 mg at 05/16/23 2536   prochlorperazine (COMPAZINE) injection 10 mg  10 mg Intravenous Q6H PRN Bobette Mo, MD       rosuvastatin (CRESTOR) tablet 20 mg  20 mg Oral Daily Bobette Mo, MD   20 mg at 05/16/23 6440    Allergies as of 05/15/2023 - Review Complete 05/15/2023  Allergen Reaction Noted   Kiwi extract Itching 03/26/2011   Meloxicam Other (See Comments) 01/09/2023   Pineapple Itching 03/26/2011    Family History   Problem Relation Age of Onset   Hyperlipidemia Mother    Hypertension Mother    Heart disease Mother    Diabetes Mother    Obesity Mother    Kidney disease Mother    Stroke Father    Hyperlipidemia Father    Hypertension Father    Heart disease Father    Heart attack Father    High Cholesterol Father    Depression Father    Anxiety disorder Father    Alcoholism Father    Prostate  cancer Brother    Heart attack Brother    Heart disease Brother     Social History   Socioeconomic History   Marital status: Widowed    Spouse name: Not on file   Number of children: Not on file   Years of education: Not on file   Highest education level: Not on file  Occupational History   Occupation: Dialysis Tech, retired  Tobacco Use   Smoking status: Former    Current packs/day: 0.00    Average packs/day: 1 pack/day for 20.0 years (20.0 ttl pk-yrs)    Types: Cigarettes    Start date: 05/23/1991    Quit date: 05/23/2011    Years since quitting: 11.9   Smokeless tobacco: Never  Substance and Sexual Activity   Alcohol use: No    Comment: occassional   Drug use: No   Sexual activity: Not on file  Other Topics Concern   Not on file  Social History Narrative   ** Merged History Encounter **       Social Drivers of Health   Financial Resource Strain: Not on file  Food Insecurity: No Food Insecurity (05/15/2023)   Hunger Vital Sign    Worried About Running Out of Food in the Last Year: Never true    Ran Out of Food in the Last Year: Never true  Transportation Needs: No Transportation Needs (05/15/2023)   PRAPARE - Administrator, Civil Service (Medical): No    Lack of Transportation (Non-Medical): No  Physical Activity: Not on file  Stress: Not on file  Social Connections: Moderately Isolated (05/15/2023)   Social Connection and Isolation Panel [NHANES]    Frequency of Communication with Friends and Family: More than three times a week    Frequency of Social Gatherings  with Friends and Family: Once a week    Attends Religious Services: More than 4 times per year    Active Member of [employer] or Organizations: No    Attends Banker Meetings: Never    Marital Status: Widowed  Intimate Partner Violence: Not At Risk (05/15/2023)   Humiliation, Afraid, Rape, and Kick questionnaire    Fear of Current or Ex-Partner: No    Emotionally Abused: No    Physically Abused: No    Sexually Abused: No    Review of Systems: As per HPI, all others negative  Physical Exam: Vital signs in last 24 hours: Temp:  [97.4 F (36.3 C)-98.4 F (36.9 C)] 97.9 F (36.6 C) (04/05 0420) Pulse Rate:  [50-63] 59 (04/05 0420) Resp:  [16-22] 16 (04/05 0420) BP: (130-153)/(61-75) 138/70 (04/05 0420) SpO2:  [96 %-100 %] 96 % (04/05 0420) FiO2 (%):  [21 %] 21 % (04/04 2218) Weight:  [117.9 kg] 117.9 kg (04/04 2117) Last BM Date : 05/15/23 General:   Alert,  Well-developed, well-nourished, pleasant and cooperative in NAD Head:  Normocephalic and atraumatic. Eyes:  Sclera clear, no icterus.   Conjunctiva pink. Ears:  Normal auditory acuity. Nose:  No deformity, discharge,  or lesions. Mouth:  No deformity or lesions.  Oropharynx pink & moist. Neck:  Supple; no masses or thyromegaly. Lungs:  No visible respiratory distress Abdomen:  Soft, mild generalized tenderness without peritonitis    Msk:  Symmetrical without gross deformities. Normal posture. Pulses:  Normal pulses noted. Extremities:  Without clubbing or edema. Neurologic:  Alert and  oriented x4;  grossly normal neurologically. Skin:  Intact without significant lesions or rashes. Psych:  Alert and cooperative. Normal mood  and affect.   Lab Results: Recent Labs    05/15/23 0248 05/16/23 0602  WBC 7.7 4.9  HGB 13.7 14.1  HCT 40.9 43.5  PLT 309 344   BMET Recent Labs    05/15/23 0248 05/16/23 0602  NA 137 139  K 3.4* 3.9  CL 105 105  CO2 22 25  GLUCOSE 154* 113*  BUN 12 7*  CREATININE 0.68 0.85   CALCIUM 9.1 9.5   LFT Recent Labs    05/16/23 0602  PROT 7.4  ALBUMIN 3.9  AST 19  ALT 18  ALKPHOS 54  BILITOT 0.7   PT/INR No results for input(s): "LABPROT", "INR" in the last 72 hours.  Studies/Results: CT ABDOMEN PELVIS W CONTRAST Result Date: 05/15/2023 CLINICAL DATA:  78 year old female with right lower quadrant, abdominal pain for 2 days. EXAM: CT ABDOMEN AND PELVIS WITH CONTRAST TECHNIQUE: Multidetector CT imaging of the abdomen and pelvis was performed using the standard protocol following bolus administration of intravenous contrast. RADIATION DOSE REDUCTION: This exam was performed according to the departmental dose-optimization program which includes automated exposure control, adjustment of the mA and/or kV according to patient size and/or use of iterative reconstruction technique. CONTRAST:  OMNIPAQUE IOHEXOL 300 MG/ML  SOLN COMPARISON:  CTA chest 04/20/2023. FINDINGS: Lower chest: Negative. Hepatobiliary: Liver and gallbladder are within normal limits, but there is trace perihepatic free fluid with simple fluid density (series 2, image 10. Pancreas: Negative. Spleen: Trace perisplenic free fluid (series 2, image 16), simple fluid density. But otherwise the spleen appears normal, nonenlarged. Adrenals/Urinary Tract: Normal adrenal glands. Kidneys appear symmetric and normal. Normal renal contrast enhancement and excretion to diminutive ureters. Occasional tiny renal cysts (no follow-up imaging recommended). Streak artifact in the pelvis from right hip arthroplasty. Mildly distended but otherwise unremarkable urinary bladder. Stomach/Bowel: Rectum and distal sigmoid colon appear negative. Severe diverticulosis throughout the proximal sigmoid and also the descending colon. Indistinct appearance of the sigmoid, with nearby small volume of free fluid (series 2, image 60). And on coronal image 100 suggestion of mild inflammatory stranding along the distal descending colon.  Nondilated upstream large bowel. Additional transverse colon diverticulosis. Mild retained stool. Fluid in the right colon. There are numerous gonadal vein phleboliths near the appendix. Mild soft tissue stranding in the region such as on coronal image 99. But appendix seems to remain normal on series 7, image 95 and series 2, image 52. Fluid containing nondilated small bowel loops throughout the lower abdomen and including the distal ileum appear to be mildly hyperenhancing such as on coronal image 108, and there is a small volume of free fluid in the distal small bowel mesentery (coronal images 92 and 78) bilaterally. No dilated small bowel. Decompressed stomach and duodenum. No pneumoperitoneum. No free fluid identified in the upper abdomen. Vascular/Lymphatic: Aortoiliac calcified atherosclerosis. Major arterial structures remain patent. Normal caliber abdominal aorta. Portal venous system is patent. Reproductive: Absent uterus.  Diminutive or absent ovaries. Other: There is no layering free fluid in the pelvis. But there is a small volume of simple density free fluid at the left pelvic inlet which seems to be in the peritoneal cavity series 2, image 60. Musculoskeletal: Advanced spinal degeneration. Widespread vacuum disc, endplate spurring. Lower lumbar grade 1 spondylolisthesis with bulky facet arthropathy. Bilateral SI joint degeneration. Right hip arthroplasty. No acute osseous abnormality identified. IMPRESSION: 1. Scattered small volume of abnormal but nonspecific simple density free fluid in the abdomen and pelvis, especially amongst distal small bowel mesentery and at the  pelvic inlet. Extensive diverticulosis of the distal large bowel, and also nondilated but fluid-filled and mildly hyperenhancing distal small bowel loops. Overall favor Acute Enterocolitis. Acute Diverticulitis also not excluded. There is inflammation and free fluid near the appendix, but no strong evidence of acute appendicitis at  this time. If the clinical course is equivocal then repeat CT Abdomen and Pelvis with both oral and IV contrast is recommended in 24 hours. 2. No evidence of bowel obstruction or perforation. And other abdominal viscera appear negative. 3.  Aortic Atherosclerosis (ICD10-I70.0). Electronically Signed   By: Odessa Fleming M.D.   On: 05/15/2023 06:09   Impression:   Abdominal pain, improving but not resolved. Abnormal CT scan.  Enterocolitis suspected; not typical of diverticulitis or appendicitis.  Plan:   Supportive management. Advance diet as tolerated. No further inpatient GI work-up planned or anticipated. If symptoms worsen unexpectedly in the meantime, consider repeat CT abd/pelvis with oral and IV contrast. [facility] will sign-off; please call with questions; thank you for the consultation.   LOS: 0 days   Magdalina Whitehead M  05/16/2023, 11:42 AM  Cell [phone] If no answer or after 5 PM call [phone]

## 2023-05-16 NOTE — Plan of Care (Signed)

## 2023-05-16 NOTE — Plan of Care (Signed)

## 2023-05-16 NOTE — Progress Notes (Signed)
 Mobility Specialist - Progress Note   05/16/23 1004  Mobility  Activity Ambulated with assistance in hallway  Level of Assistance Modified independent, requires aide device or extra time  Assistive Device Other (Comment) (IV Pole)  Distance Ambulated (ft) 250 ft  Activity Response Tolerated well  Mobility Referral Yes  Mobility visit 1 Mobility  Mobility Specialist Start Time (ACUTE ONLY) 0954  Mobility Specialist Stop Time (ACUTE ONLY) 1004  Mobility Specialist Time Calculation (min) (ACUTE ONLY) 10 min   Pt received in bed and agreeable to mobility. No complaints during session. Pt to bathroom after session with all needs met.    [facility]

## 2023-05-17 ENCOUNTER — Inpatient Hospital Stay (HOSPITAL_COMMUNITY)

## 2023-05-17 DIAGNOSIS — Z886 Allergy status to analgesic agent status: Secondary | ICD-10-CM | POA: Diagnosis not present

## 2023-05-17 DIAGNOSIS — E785 Hyperlipidemia, unspecified: Secondary | ICD-10-CM | POA: Diagnosis present

## 2023-05-17 DIAGNOSIS — G4730 Sleep apnea, unspecified: Secondary | ICD-10-CM | POA: Diagnosis present

## 2023-05-17 DIAGNOSIS — Z8249 Family history of ischemic heart disease and other diseases of the circulatory system: Secondary | ICD-10-CM | POA: Diagnosis not present

## 2023-05-17 DIAGNOSIS — R7303 Prediabetes: Secondary | ICD-10-CM | POA: Diagnosis present

## 2023-05-17 DIAGNOSIS — Z87891 Personal history of nicotine dependence: Secondary | ICD-10-CM | POA: Diagnosis not present

## 2023-05-17 DIAGNOSIS — K760 Fatty (change of) liver, not elsewhere classified: Secondary | ICD-10-CM | POA: Diagnosis not present

## 2023-05-17 DIAGNOSIS — Z83438 Family history of other disorder of lipoprotein metabolism and other lipidemia: Secondary | ICD-10-CM | POA: Diagnosis not present

## 2023-05-17 DIAGNOSIS — K5730 Diverticulosis of large intestine without perforation or abscess without bleeding: Secondary | ICD-10-CM | POA: Diagnosis not present

## 2023-05-17 DIAGNOSIS — K5900 Constipation, unspecified: Secondary | ICD-10-CM | POA: Diagnosis present

## 2023-05-17 DIAGNOSIS — K5732 Diverticulitis of large intestine without perforation or abscess without bleeding: Secondary | ICD-10-CM | POA: Diagnosis present

## 2023-05-17 DIAGNOSIS — Z833 Family history of diabetes mellitus: Secondary | ICD-10-CM | POA: Diagnosis not present

## 2023-05-17 DIAGNOSIS — E559 Vitamin D deficiency, unspecified: Secondary | ICD-10-CM | POA: Diagnosis present

## 2023-05-17 DIAGNOSIS — R109 Unspecified abdominal pain: Secondary | ICD-10-CM | POA: Diagnosis not present

## 2023-05-17 DIAGNOSIS — K219 Gastro-esophageal reflux disease without esophagitis: Secondary | ICD-10-CM | POA: Diagnosis present

## 2023-05-17 DIAGNOSIS — Z9102 Food additives allergy status: Secondary | ICD-10-CM | POA: Diagnosis not present

## 2023-05-17 DIAGNOSIS — E876 Hypokalemia: Secondary | ICD-10-CM | POA: Diagnosis present

## 2023-05-17 DIAGNOSIS — Z7901 Long term (current) use of anticoagulants: Secondary | ICD-10-CM | POA: Diagnosis not present

## 2023-05-17 DIAGNOSIS — I447 Left bundle-branch block, unspecified: Secondary | ICD-10-CM | POA: Diagnosis present

## 2023-05-17 DIAGNOSIS — M549 Dorsalgia, unspecified: Secondary | ICD-10-CM | POA: Diagnosis present

## 2023-05-17 DIAGNOSIS — K529 Noninfective gastroenteritis and colitis, unspecified: Secondary | ICD-10-CM | POA: Diagnosis not present

## 2023-05-17 DIAGNOSIS — Z79899 Other long term (current) drug therapy: Secondary | ICD-10-CM | POA: Diagnosis not present

## 2023-05-17 DIAGNOSIS — Z91018 Allergy to other foods: Secondary | ICD-10-CM | POA: Diagnosis not present

## 2023-05-17 DIAGNOSIS — I10 Essential (primary) hypertension: Secondary | ICD-10-CM | POA: Diagnosis present

## 2023-05-17 DIAGNOSIS — G8929 Other chronic pain: Secondary | ICD-10-CM | POA: Diagnosis present

## 2023-05-17 DIAGNOSIS — Z96641 Presence of right artificial hip joint: Secondary | ICD-10-CM | POA: Diagnosis present

## 2023-05-17 DIAGNOSIS — Z86711 Personal history of pulmonary embolism: Secondary | ICD-10-CM | POA: Diagnosis not present

## 2023-05-17 MED ORDER — POLYETHYLENE GLYCOL 3350 17 G PO PACK
17.0000 g | PACK | Freq: Every day | ORAL | Status: DC
  Administered 2023-05-17 – 2023-05-18 (×2): 17 g via ORAL
  Filled 2023-05-17 (×2): qty 1

## 2023-05-17 MED ORDER — SENNA 8.6 MG PO TABS
1.0000 | ORAL_TABLET | Freq: Two times a day (BID) | ORAL | Status: DC
  Administered 2023-05-17 – 2023-05-18 (×3): 8.6 mg via ORAL
  Filled 2023-05-17 (×3): qty 1

## 2023-05-17 MED ORDER — CARMEX CLASSIC LIP BALM EX OINT
TOPICAL_OINTMENT | CUTANEOUS | Status: DC | PRN
  Filled 2023-05-17: qty 10

## 2023-05-17 MED ORDER — IOHEXOL 9 MG/ML PO SOLN
500.0000 mL | ORAL | Status: AC
  Administered 2023-05-17 (×2): 500 mL via ORAL

## 2023-05-17 MED ORDER — IOHEXOL 300 MG/ML  SOLN
100.0000 mL | Freq: Once | INTRAMUSCULAR | Status: AC | PRN
  Administered 2023-05-17: 100 mL via INTRAVENOUS

## 2023-05-17 NOTE — Progress Notes (Signed)
   05/17/23 2328  BiPAP/CPAP/SIPAP  BiPAP/CPAP/SIPAP Pt Type Adult  BiPAP/CPAP/SIPAP Resmed  Mask Type Nasal mask  Dentures removed? Not applicable  Mask Size Small  EPAP 4 cmH2O  FiO2 (%) 21 %  Patient Home Machine No  Patient Home Mask No  Patient Home Tubing No  Auto Titrate No  Device Plugged into RED Power Outlet Yes

## 2023-05-17 NOTE — Progress Notes (Signed)
 PROGRESS NOTE  [name]  WUJ:[id number] DOB: [birth year] DOA: 05/15/2023 PCP: Emilio Aspen, MD   Brief Narrative: Patient is a 78 year old female with history of anxiety, osteoarthritis, chronic back pain, GERD, hyperlipidemia, hypertension, PE on Eliquis who presented with complaint of right lower quadrant/right upper quadrant abdominal pain, nausea, diarrhea.  On presentation, she was hemodynamically stable.  Lab work showed potassium of 3.4.  CT abdomen/pelvis showed diverticulitis, nondilated fluid-filled, mildly hyperenhancing distal small bowel loops favoring for acute enterocolitis.  Acute diverticulitis not excluded.  Patient admitted for the management of enterocolitis.  GI pathogen panel pending. no Fever leukocytosis. Still have some discomfort on the right lower quadrant.  Planning for starting bowel regimen today.  Assessment & Plan:  Principal Problem:   Enterocolitis Active Problems:   Hypertension   Hyperlipidemia   Prediabetes   Vitamin D deficiency   Hyperglycemia   Sleep apnea   Pulmonary embolism without acute cor pulmonale (HCC)   Hypokalemia  Enterocolitis: Presented with abdominal pain, nausea, diarrhea.  Diarrhea has stopped.  No fever or leukocytosis.  CT abdomen/pelvis as above.  Continue ceftriaxone and Flagyl Check GI pathogen panel.No bowel movement after admission CT also showed inflammation and free fluid near the appendix, but no strong evidence of acute appendicitis at this time. She  continues to have abdominal discomfort, will plan for repeat CT scan today with IV and oral contrast.  Continue bowel regimen.  GI signed off  Hypertension: Continue amlodipine, carvedilol.  Monitor blood pressure  Hyperlipidemia: On Lipitor  Prediabetes: A1c of 6.2 estimated 3 months ago  Sleep apnea: On CPAP  PE: On Eliquis  Hypokalemia: Supplemented and corrected         DVT prophylaxis: apixaban (ELIQUIS) tablet 5 mg     Code Status: Full  Code  Family Communication: None at bedside  Patient status:Inpatient  Patient is from :Home  Anticipated discharge NF:AOZH  Estimated DC date:tomorrow   Consultants: GI  Procedures:None  Antimicrobials:  Anti-infectives (From admission, onward)    Start     Dose/Rate Route Frequency Ordered Stop   05/16/23 1000  metroNIDAZOLE (FLAGYL) IVPB 500 mg        500 mg 100 mL/hr over 60 Minutes Intravenous Every 12 hours 05/16/23 0827     05/16/23 0915  cefTRIAXone (ROCEPHIN) 2 g in sodium chloride 0.9 % 100 mL IVPB        2 g 200 mL/hr over 30 Minutes Intravenous Every 24 hours 05/16/23 0827         Subjective: Patient seen and examined at bedside today.  Remains hemodynamically stable.  No nausea vomiting or diarrhea but continues to have right lower quadrant discomfort.  No bowel movement for last few days after the episodes of diarrhea.  Has good bowel sounds  Objective: Vitals:   05/16/23 1958 05/16/23 2200 05/17/23 0449 05/17/23 0857  BP: 135/69 136/71 (!) 153/68 (!) 149/75  Pulse: (!) 54  (!) 57 62  Resp: 16  16 16   Temp: 98.1 F (36.7 C)  97.6 F (36.4 C)   TempSrc:      SpO2: 99%  96% 99%  Weight:      Height:        Intake/Output Summary (Last 24 hours) at 05/17/2023 1125 Last data filed at 05/17/2023 1035 Gross per 24 hour  Intake 240 ml  Output --  Net 240 ml   Filed Weights   05/15/23 2117  Weight: 117.9 kg    Examination:   General  exam: Overall comfortable, not in distress,obese HEENT: PERRL Respiratory system:  no wheezes or crackles  Cardiovascular system: S1 & S2 heard, RRR.  Gastrointestinal system: Abdomen is nondistended, soft  Mild tenderness on the right right lower quadrant Central nervous system: Alert and oriented Extremities: No edema, no clubbing ,no cyanosis Skin: No rashes, no ulcers,no icterus       Data Reviewed: I have personally reviewed following labs and imaging studies  CBC: Recent Labs  Lab 05/15/23 0248  05/16/23 0602  WBC 7.7 4.9  NEUTROABS 5.6  --   HGB 13.7 14.1  HCT 40.9 43.5  MCV 94.0 98.6  PLT 309 344   Basic Metabolic Panel: Recent Labs  Lab 05/15/23 0247 05/15/23 0248 05/16/23 0602  NA  --  137 139  K  --  3.4* 3.9  CL  --  105 105  CO2  --  22 25  GLUCOSE  --  154* 113*  BUN  --  12 7*  CREATININE  --  0.68 0.85  CALCIUM  --  9.1 9.5  MG 2.2  --   --   PHOS 2.8  --   --      No results found for this or any previous visit (from the past 240 hours).   Radiology Studies: No results found.   Scheduled Meds:  amLODipine  5 mg Oral BID   apixaban  5 mg Oral BID   carvedilol  12.5 mg Oral BID WC   cholecalciferol  1,000 Units Oral Daily   pantoprazole  20 mg Oral Daily   polyethylene glycol  17 g Oral Daily   rosuvastatin  20 mg Oral Daily   senna  1 tablet Oral BID   Continuous Infusions:  cefTRIAXone (ROCEPHIN)  IV 2 g (05/17/23 0901)   metronidazole 500 mg (05/17/23 1027)     LOS: 0 days   Burnadette Pop, MD [facility] P4/07/2023, 11:25 AM

## 2023-05-17 NOTE — Plan of Care (Signed)

## 2023-05-17 NOTE — Progress Notes (Signed)
   05/17/23 0100  BiPAP/CPAP/SIPAP  $ Non-Invasive Home Ventilator  Subsequent  BiPAP/CPAP/SIPAP Pt Type Adult  BiPAP/CPAP/SIPAP Resmed  Mask Type Nasal mask  Mask Size Small  EPAP 4 cmH2O  FiO2 (%) 21 %  Patient Home Mask No  Patient Home Tubing No  Auto Titrate No

## 2023-05-17 NOTE — Progress Notes (Signed)
 Mobility Specialist - Progress Note   05/17/23 1139  Mobility  Activity Ambulated independently in hallway  Level of Assistance Independent  Assistive Device None  Distance Ambulated (ft) 500 ft  Activity Response Tolerated well  Mobility Referral Yes  Mobility visit 1 Mobility  Mobility Specialist Start Time (ACUTE ONLY) 1131  Mobility Specialist Stop Time (ACUTE ONLY) 1139  Mobility Specialist Time Calculation (min) (ACUTE ONLY) 8 min   Pt received in bed and agreeable to mobility. No complaints during session. Pt took x1 standing rest break d/t L knee pain. Pt to bathroom after session with all needs met.    [facility]

## 2023-05-18 ENCOUNTER — Ambulatory Visit (INDEPENDENT_AMBULATORY_CARE_PROVIDER_SITE_OTHER): Admitting: Family Medicine

## 2023-05-18 ENCOUNTER — Other Ambulatory Visit (HOSPITAL_COMMUNITY): Payer: Self-pay

## 2023-05-18 DIAGNOSIS — K529 Noninfective gastroenteritis and colitis, unspecified: Secondary | ICD-10-CM | POA: Diagnosis not present

## 2023-05-18 MED ORDER — POLYETHYLENE GLYCOL 3350 17 GM/SCOOP PO POWD
17.0000 g | Freq: Every day | ORAL | 0 refills | Status: AC
  Filled 2023-05-18: qty 238, 14d supply, fill #0

## 2023-05-18 MED ORDER — AMOXICILLIN-POT CLAVULANATE 875-125 MG PO TABS
1.0000 | ORAL_TABLET | Freq: Two times a day (BID) | ORAL | 0 refills | Status: AC
  Filled 2023-05-18: qty 10, 5d supply, fill #0

## 2023-05-18 MED ORDER — AMOXICILLIN-POT CLAVULANATE 875-125 MG PO TABS
1.0000 | ORAL_TABLET | Freq: Two times a day (BID) | ORAL | Status: DC
  Administered 2023-05-18: 1 via ORAL
  Filled 2023-05-18: qty 1

## 2023-05-18 NOTE — Plan of Care (Signed)

## 2023-05-18 NOTE — Discharge Summary (Signed)
 Physician Discharge Summary  [name] XBJ:[id number] DOB: 1945/05/10 DOA: 05/15/2023  PCP: Emilio Aspen, MD  Admit date: 05/15/2023 Discharge date: 05/18/2023  Admitted From: Home Disposition:  Home  Discharge Condition:Stable CODE STATUS:FULL Diet recommendation: Heart Healthy   Brief/Interim Summary: Patient is a 78 year old female with history of anxiety, osteoarthritis, chronic back pain, GERD, hyperlipidemia, hypertension, PE on Eliquis who presented with complaint of right lower quadrant/right upper quadrant abdominal pain, nausea, diarrhea.  On presentation, she was hemodynamically stable.  Lab work showed potassium of 3.4.  CT abdomen/pelvis showed diverticulitis, nondilated fluid-filled, mildly hyperenhancing distal small bowel loops favoring for acute enterocolitis.  Acute diverticulitis not excluded.  Patient admitted for the management of enterocolitis.  GI pathogen panel cudnt be obtained because the diarrhea resolved.. no Fever or leukocytosis.  Follow-up CT imaging showed stable changes.  Medically stable for discharge home today with oral antibiotics.  Abdomen pain significantly improved.  Following problems were addressed during the hospitalization:   Enterocolitis: Presented with abdominal pain, nausea, diarrhea.  Diarrhea has stopped.  No fever or leukocytosis.  CT abdomen/pelvis   diverticulitis, nondilated fluid-filled, mildly hyperenhancing distal small bowel loops favoring for acute enterocolitis. Started on antibiotics.  She had persistent right lower quadrant pain, follow-up CT scan showed stable changes.  Abdomen pain significant improved after having bowel movement on 4/6.  Antibiotics changed to oral   Hypertension: Continue amlodipine, carvedilol.  Monitor blood pressure at home   Hyperlipidemia: On Lipitor   Prediabetes: A1c of 6.2    Sleep apnea: On CPAP   PE: On Eliquis   Hypokalemia: Supplemented and corrected      Discharge Diagnoses:   Principal Problem:   Enterocolitis Active Problems:   Hypertension   Hyperlipidemia   Prediabetes   Vitamin D deficiency   Hyperglycemia   Sleep apnea   Pulmonary embolism without acute cor pulmonale (HCC)   Hypokalemia    Discharge Instructions  Discharge Instructions     Diet - low sodium heart healthy   Complete by: As directed    Discharge instructions   Complete by: As directed    1)Please take prescribed medications as instructed 2)Follow up with your PCP in a week   Increase activity slowly   Complete by: As directed       Allergies as of 05/18/2023       Reactions   Kiwi Extract Itching   Meloxicam Other (See Comments)   Hypertension   Pineapple Itching        Medication List     STOP taking these medications    famotidine 20 MG tablet Commonly known as: PEPCID   furosemide 20 MG tablet Commonly known as: LASIX   lisinopril 20 MG tablet Commonly known as: ZESTRIL       TAKE these medications    acetaminophen 650 MG CR tablet Commonly known as: TYLENOL Take 650 mg by mouth every 8 (eight) hours as needed for pain.   amLODipine 5 MG tablet Commonly known as: NORVASC Take 5 mg by mouth 2 (two) times daily.   amoxicillin-clavulanate 875-125 MG tablet Commonly known as: AUGMENTIN Take 1 tablet by mouth every 12 (twelve) hours for 5 days.   carvedilol 12.5 MG tablet Commonly known as: Coreg Take 1 tablet (12.5 mg total) by mouth 2 (two) times daily with a meal.   CENTRUM SILVER PO Take by mouth.   cholecalciferol 25 MCG (1000 UNIT) tablet Commonly known as: VITAMIN D3 Take 1,000 Units by mouth daily.  Eliquis 5 MG Tabs tablet Generic drug: apixaban Take 5 mg by mouth 2 (two) times daily. What changed: Another medication with the same name was removed. Continue taking this medication, and follow the directions you see here.   HYDROcodone-acetaminophen 5-325 MG tablet Commonly known as: NORCO/VICODIN Take 1 tablet by mouth  every 6 (six) hours as needed.   pantoprazole 20 MG tablet Commonly known as: PROTONIX Take 1 tablet (20 mg total) by mouth daily.   polyethylene glycol 17 g packet Commonly known as: MIRALAX / GLYCOLAX Take 17 g by mouth daily.   rosuvastatin 20 MG tablet Commonly known as: CRESTOR Take 1 tablet (20 mg total) by mouth daily.   vitamin C 1000 MG tablet Take 1,000 mg by mouth daily.   Vitamin D (Ergocalciferol) 1.25 MG (50000 UNIT) Caps capsule Commonly known as: DRISDOL Take 1 capsule (50,000 Units total) by mouth every 7 (seven) days.        Allergies  Allergen Reactions   Kiwi Extract Itching   Meloxicam Other (See Comments)    Hypertension   Pineapple Itching    Consultations:    Procedures/Studies: CT ABDOMEN PELVIS W CONTRAST Result Date: 05/17/2023 CLINICAL DATA:  Abdominal pain. EXAM: CT ABDOMEN AND PELVIS WITH CONTRAST TECHNIQUE: Multidetector CT imaging of the abdomen and pelvis was performed using the standard protocol following bolus administration of intravenous contrast. RADIATION DOSE REDUCTION: This exam was performed according to the departmental dose-optimization program which includes automated exposure control, adjustment of the mA and/or kV according to patient size and/or use of iterative reconstruction technique. CONTRAST:  OMNIPAQUE IOHEXOL 300 MG/ML  SOLN COMPARISON:  CT abdomen pelvis dated 05/15/2023. FINDINGS: Lower chest: The visualized lung bases are clear. No intra-abdominal free air or free fluid. Hepatobiliary: Mild fatty liver. No BD dilatation. The gallbladder is contracted. No calcified gallstone. Pancreas: Unremarkable. No pancreatic ductal dilatation or surrounding inflammatory changes. Spleen: Normal in size without focal abnormality. Adrenals/Urinary Tract: The adrenal glands unremarkable. Mild fullness of the renal collecting systems bilaterally. There is symmetric enhancement and excretion of contrast by both kidneys. The visualized  ureters and urinary bladder appear unremarkable. Stomach/Bowel: There is severe sigmoid diverticulosis. Minimal perisigmoid haziness, likely chronic. There is no bowel obstruction. The appendix is normal. Vascular/Lymphatic: Mild aortoiliac atherosclerotic disease. The IVC is unremarkable. No portal venous gas. There is no adenopathy. Reproductive: Hysterectomy.  No suspicious adnexal masses. Other: None Musculoskeletal: Degenerative changes of the spine. Grade 1 L4-L5 anterolisthesis. Total right hip arthroplasty. No acute osseous pathology. IMPRESSION: 1. Mild bilateral pelvicaliectasis, new since the prior CT of indeterminate etiology, possibly related to reflux. 2. Severe sigmoid diverticulosis. No bowel obstruction. Normal appendix. 3. Mild fatty liver. Electronically Signed   By: Elgie Collard M.D.   On: 05/17/2023 18:35   CT ABDOMEN PELVIS W CONTRAST Result Date: 05/15/2023 CLINICAL DATA:  78 year old female with right lower quadrant, abdominal pain for 2 days. EXAM: CT ABDOMEN AND PELVIS WITH CONTRAST TECHNIQUE: Multidetector CT imaging of the abdomen and pelvis was performed using the standard protocol following bolus administration of intravenous contrast. RADIATION DOSE REDUCTION: This exam was performed according to the departmental dose-optimization program which includes automated exposure control, adjustment of the mA and/or kV according to patient size and/or use of iterative reconstruction technique. CONTRAST:  OMNIPAQUE IOHEXOL 300 MG/ML  SOLN COMPARISON:  CTA chest 04/20/2023. FINDINGS: Lower chest: Negative. Hepatobiliary: Liver and gallbladder are within normal limits, but there is trace perihepatic free fluid with simple fluid density (series 2,  image 10. Pancreas: Negative. Spleen: Trace perisplenic free fluid (series 2, image 16), simple fluid density. But otherwise the spleen appears normal, nonenlarged. Adrenals/Urinary Tract: Normal adrenal glands. Kidneys appear symmetric and  normal. Normal renal contrast enhancement and excretion to diminutive ureters. Occasional tiny renal cysts (no follow-up imaging recommended). Streak artifact in the pelvis from right hip arthroplasty. Mildly distended but otherwise unremarkable urinary bladder. Stomach/Bowel: Rectum and distal sigmoid colon appear negative. Severe diverticulosis throughout the proximal sigmoid and also the descending colon. Indistinct appearance of the sigmoid, with nearby small volume of free fluid (series 2, image 60). And on coronal image 100 suggestion of mild inflammatory stranding along the distal descending colon. Nondilated upstream large bowel. Additional transverse colon diverticulosis. Mild retained stool. Fluid in the right colon. There are numerous gonadal vein phleboliths near the appendix. Mild soft tissue stranding in the region such as on coronal image 99. But appendix seems to remain normal on series 7, image 95 and series 2, image 52. Fluid containing nondilated small bowel loops throughout the lower abdomen and including the distal ileum appear to be mildly hyperenhancing such as on coronal image 108, and there is a small volume of free fluid in the distal small bowel mesentery (coronal images 92 and 78) bilaterally. No dilated small bowel. Decompressed stomach and duodenum. No pneumoperitoneum. No free fluid identified in the upper abdomen. Vascular/Lymphatic: Aortoiliac calcified atherosclerosis. Major arterial structures remain patent. Normal caliber abdominal aorta. Portal venous system is patent. Reproductive: Absent uterus.  Diminutive or absent ovaries. Other: There is no layering free fluid in the pelvis. But there is a small volume of simple density free fluid at the left pelvic inlet which seems to be in the peritoneal cavity series 2, image 60. Musculoskeletal: Advanced spinal degeneration. Widespread vacuum disc, endplate spurring. Lower lumbar grade 1 spondylolisthesis with bulky facet arthropathy.  Bilateral SI joint degeneration. Right hip arthroplasty. No acute osseous abnormality identified. IMPRESSION: 1. Scattered small volume of abnormal but nonspecific simple density free fluid in the abdomen and pelvis, especially amongst distal small bowel mesentery and at the pelvic inlet. Extensive diverticulosis of the distal large bowel, and also nondilated but fluid-filled and mildly hyperenhancing distal small bowel loops. Overall favor Acute Enterocolitis. Acute Diverticulitis also not excluded. There is inflammation and free fluid near the appendix, but no strong evidence of acute appendicitis at this time. If the clinical course is equivocal then repeat CT Abdomen and Pelvis with both oral and IV contrast is recommended in 24 hours. 2. No evidence of bowel obstruction or perforation. And other abdominal viscera appear negative. 3.  Aortic Atherosclerosis (ICD10-I70.0). Electronically Signed   By: Odessa Fleming M.D.   On: 05/15/2023 06:09   DG Knee Complete 4 Views Right Result Date: 04/20/2023 CLINICAL DATA:  Pain. EXAM: RIGHT KNEE - COMPLETE 4+ VIEW COMPARISON:  None Available. FINDINGS: Medial compartment joint space narrowing. Mild tricompartmental peripheral spurring. Small joint effusion. Probable ossified intra-articular bodies posteriorly. No fracture, erosion or focal bone abnormality. Mild soft tissue edema. IMPRESSION: 1. Moderate tricompartmental osteoarthritis with medial compartment joint space narrowing. 2. Small joint effusion with probable ossified intra-articular bodies posteriorly. Electronically Signed   By: Narda Rutherford M.D.   On: 04/20/2023 19:25   CT Head Wo Contrast Result Date: 04/20/2023 CLINICAL DATA:  Headache.  New onset. EXAM: CT HEAD WITHOUT CONTRAST TECHNIQUE: Contiguous axial images were obtained from the base of the skull through the vertex without intravenous contrast. RADIATION DOSE REDUCTION: This exam was performed according  to the departmental dose-optimization  program which includes automated exposure control, adjustment of the mA and/or kV according to patient size and/or use of iterative reconstruction technique. COMPARISON:  None Available. FINDINGS: Brain: No evidence of acute infarction, hemorrhage, hydrocephalus, extra-axial collection or mass lesion/mass effect. There is bilateral periventricular hypodensity, which is non-specific but most likely seen in the settings of microvascular ischemic changes. Mild in extent. Otherwise normal appearance of brain parenchyma. Ventricles are normal. Cerebral volume is age appropriate. Vascular: No hyperdense vessel or unexpected calcification. Skull: Normal. Negative for fracture or focal lesion. Note is made of hyperostosis frontalis interna. Sinuses/Orbits: No acute finding. Other: Visualized mastoid air cells are unremarkable. No mastoid effusion. IMPRESSION: *No acute intracranial abnormality. Electronically Signed   By: Jules Schick M.D.   On: 04/20/2023 15:48   CT Angio Chest PE W and/or Wo Contrast Result Date: 04/20/2023 CLINICAL DATA:  Pulmonary embolism (PE) suspected, high prob. Burning chest pain radiating to back. EXAM: CT ANGIOGRAPHY CHEST WITH CONTRAST TECHNIQUE: Multidetector CT imaging of the chest was performed using the standard protocol during bolus administration of intravenous contrast. Multiplanar CT image reconstructions and MIPs were obtained to evaluate the vascular anatomy. RADIATION DOSE REDUCTION: This exam was performed according to the departmental dose-optimization program which includes automated exposure control, adjustment of the mA and/or kV according to patient size and/or use of iterative reconstruction technique. CONTRAST:  75mL OMNIPAQUE IOHEXOL 350 MG/ML SOLN COMPARISON:  CT angiography chest from 01/08/2023. FINDINGS: Cardiovascular: No evidence of embolism to the proximal subsegmental pulmonary artery level. Normal cardiac size. No pericardial effusion. No aortic aneurysm. There  are mild peripheral atherosclerotic vascular calcifications of thoracic aorta and its major branches. Mediastinum/Nodes: Visualized thyroid gland appears grossly unremarkable. No solid / cystic mediastinal masses. The esophagus is nondistended precluding optimal assessment. No axillary, mediastinal or hilar lymphadenopathy by size criteria. Lungs/Pleura: The central tracheo-bronchial tree is patent. Mild upper lobe predominant paraseptal emphysematous changes noted. There also dependent changes in bilateral lungs. No mass or consolidation. No pleural effusion or pneumothorax. No suspicious lung nodules. Upper Abdomen: There is a tiny sliding hiatal hernia. Remaining visualized upper abdominal viscera within normal limits. Musculoskeletal: The visualized soft tissues of the chest wall are grossly unremarkable. No suspicious osseous lesions. There are mild multilevel degenerative changes in the visualized spine. Review of the MIP images confirms the above findings. IMPRESSION: 1. No embolism to the proximal subsegmental pulmonary artery level. 2. No lung mass, consolidation, pleural effusion or pneumothorax. 3. Multiple other nonacute observations, as described above. Aortic Atherosclerosis (ICD10-I70.0) and Emphysema (ICD10-J43.9). Electronically Signed   By: Jules Schick M.D.   On: 04/20/2023 15:44   DG Chest 2 View Result Date: 04/20/2023 CLINICAL DATA:  Chest pain.  Shortness of breath. EXAM: CHEST - 2 VIEW COMPARISON:  01/08/2023. FINDINGS: Bilateral lung fields are clear. Bilateral costophrenic angles are clear. Normal cardio-mediastinal silhouette. No acute osseous abnormalities. The soft tissues are within normal limits. IMPRESSION: No active cardiopulmonary disease. Electronically Signed   By: Jules Schick M.D.   On: 04/20/2023 15:38   VAS [place] LOWER EXTREMITY VENOUS (DVT) (ONLY [facility] & [facility]) Result Date: 04/20/2023  Lower Venous DVT Study Patient Name:  CHARDAE MULKERN  Date of Exam:   04/20/2023 Medical  Rec #: [id number]       Accession #:    [id number] Date of Birth: 1945-11-07       Patient Gender: F Patient Age:   78 years Exam Location:  [facility] Procedure:  VAS [place] LOWER EXTREMITY VENOUS (DVT) Referring Phys: [facility] PFEIFFER --------------------------------------------------------------------------------  Indications: Pain.  Risk Factors: None identified. Limitations: Poor ultrasound/tissue interface. Comparison Study: No prior studies. Performing Technologist: Chanda Busing RVT  Examination Guidelines: A complete evaluation includes B-mode imaging, spectral Doppler, color Doppler, and power Doppler as needed of all accessible portions of each vessel. Bilateral testing is considered an integral part of a complete examination. Limited examinations for reoccurring indications may be performed as noted. The reflux portion of the exam is performed with the patient in reverse Trendelenburg.  +---------+---------------+---------+-----------+----------+-------------------+ RIGHT    CompressibilityPhasicitySpontaneityPropertiesThrombus Aging      +---------+---------------+---------+-----------+----------+-------------------+ CFV      Full           Yes      Yes                                      +---------+---------------+---------+-----------+----------+-------------------+ SFJ      Full                                                             +---------+---------------+---------+-----------+----------+-------------------+ FV Prox  Full                                                             +---------+---------------+---------+-----------+----------+-------------------+ FV Mid   Full                                                             +---------+---------------+---------+-----------+----------+-------------------+ FV DistalFull                                                              +---------+---------------+---------+-----------+----------+-------------------+ PFV      Full                                                             +---------+---------------+---------+-----------+----------+-------------------+ POP      Full           Yes      Yes                                      +---------+---------------+---------+-----------+----------+-------------------+ PTV      Full                                                             +---------+---------------+---------+-----------+----------+-------------------+  PERO                                                  Not well visualized +---------+---------------+---------+-----------+----------+-------------------+   +----+---------------+---------+-----------+----------+--------------+ LEFTCompressibilityPhasicitySpontaneityPropertiesThrombus Aging +----+---------------+---------+-----------+----------+--------------+ CFV Full           Yes      Yes                                 +----+---------------+---------+-----------+----------+--------------+    Summary: RIGHT: - There is no evidence of deep vein thrombosis in the lower extremity. However, portions of this examination were limited- see technologist comments above.  - No cystic structure found in the popliteal fossa.  LEFT: - No evidence of common femoral vein obstruction.   *See table(s) above for measurements and observations. Electronically signed by Sherald Hess MD on 04/20/2023 at 2:19:10 PM.    Final       Subjective: Patient seen and examined at bedside today.  She feels much better today.  Abdominal pain is significantly less.  No nausea vomiting.  Had a good bowel movement last night.  Feels ready to go home.  Medically stable for discharge.  Discharge Exam: Vitals:   05/17/23 1958 05/18/23 0309  BP: (!) 131/57 (!) 124/57  Pulse: (!) 54 (!) 55  Resp: 18 18  Temp: 97.9 F (36.6 C) 97.8 F (36.6 C)  SpO2: 97%  100%   Vitals:   05/17/23 1154 05/17/23 1651 05/17/23 1958 05/18/23 0309  BP: (!) 127/93 (!) 151/92 (!) 131/57 (!) 124/57  Pulse: 66 69 (!) 54 (!) 55  Resp:   18 18  Temp: (!) 97.5 F (36.4 C)  97.9 F (36.6 C) 97.8 F (36.6 C)  TempSrc: Oral     SpO2: 98% 99% 97% 100%  Weight:      Height:        General: Pt is alert, awake, not in acute distress,obese Cardiovascular: RRR, S1/S2 +, no rubs, no gallops Respiratory: CTA bilaterally, no wheezing, no rhonchi Abdominal: Soft, NT, ND, bowel sounds + Extremities: no edema, no cyanosis    The results of significant diagnostics from this hospitalization (including imaging, microbiology, ancillary and laboratory) are listed below for reference.     Microbiology: No results found for this or any previous visit (from the past 240 hours).   Labs: BNP (last 3 results) No results for input(s): "BNP" in the last 8760 hours. Basic Metabolic Panel: Recent Labs  Lab 05/15/23 0247 05/15/23 0248 05/16/23 0602  NA  --  137 139  K  --  3.4* 3.9  CL  --  105 105  CO2  --  22 25  GLUCOSE  --  154* 113*  BUN  --  12 7*  CREATININE  --  0.68 0.85  CALCIUM  --  9.1 9.5  MG 2.2  --   --   PHOS 2.8  --   --    Liver Function Tests: Recent Labs  Lab 05/15/23 0248 05/16/23 0602  AST 21 19  ALT 20 18  ALKPHOS 56 54  BILITOT 0.6 0.7  PROT 7.4 7.4  ALBUMIN 3.8 3.9   Recent Labs  Lab 05/15/23 0248  LIPASE 28   No results for input(s): "AMMONIA" in the last 168 hours. CBC: Recent Labs  Lab 05/15/23 0248 05/16/23 0602  WBC 7.7 4.9  NEUTROABS 5.6  --   HGB 13.7 14.1  HCT 40.9 43.5  MCV 94.0 98.6  PLT 309 344   Cardiac Enzymes: No results for input(s): "CKTOTAL", "CKMB", "CKMBINDEX", "TROPONINI" in the last 168 hours. BNP: Invalid input(s): "POCBNP" CBG: No results for input(s): "GLUCAP" in the last 168 hours. D-Dimer No results for input(s): "DDIMER" in the last 72 hours. Hgb A1c No results for input(s): "HGBA1C"  in the last 72 hours. Lipid Profile No results for input(s): "CHOL", "HDL", "LDLCALC", "TRIG", "CHOLHDL", "LDLDIRECT" in the last 72 hours. Thyroid function studies No results for input(s): "TSH", "T4TOTAL", "T3FREE", "THYROIDAB" in the last 72 hours.  Invalid input(s): "FREET3" Anemia work up No results for input(s): "VITAMINB12", "FOLATE", "FERRITIN", "TIBC", "IRON", "RETICCTPCT" in the last 72 hours. Urinalysis    Component Value Date/Time   COLORURINE YELLOW 05/15/2023 1243   APPEARANCEUR CLEAR 05/15/2023 1243   LABSPEC >1.046 (H) 05/15/2023 1243   PHURINE 6.0 05/15/2023 1243   GLUCOSEU NEGATIVE 05/15/2023 1243   HGBUR SMALL (A) 05/15/2023 1243   BILIRUBINUR NEGATIVE 05/15/2023 1243   KETONESUR NEGATIVE 05/15/2023 1243   PROTEINUR NEGATIVE 05/15/2023 1243   UROBILINOGEN 1.0 03/31/2011 1347   NITRITE NEGATIVE 05/15/2023 1243   LEUKOCYTESUR NEGATIVE 05/15/2023 1243   Sepsis Labs Recent Labs  Lab 05/15/23 0248 05/16/23 0602  WBC 7.7 4.9   Microbiology No results found for this or any previous visit (from the past 240 hours).  Please note: You were cared for by a hospitalist during your hospital stay. Once you are discharged, your primary care physician will handle any further medical issues. Please note that NO REFILLS for any discharge medications will be authorized once you are discharged, as it is imperative that you return to your primary care physician (or establish a relationship with a primary care physician if you do not have one) for your post hospital discharge needs so that they can reassess your need for medications and monitor your lab values.    Time coordinating discharge: 40 minutes  SIGNED:   Burnadette Pop, MD  [facility] 05/18/2023, 9:28 AM Pager [phone]  If 7PM-7AM, please contact night-coverage [email or web address] Password [email or web address]

## 2023-05-18 NOTE — Progress Notes (Signed)
 AVS reviewed w/ pt who verbalized an understanding. No other questions. PIV rem,oved as noted. Pt dressed for d/c to home- pt's son enroute

## 2023-05-18 NOTE — Progress Notes (Signed)
   05/18/23 1013  TOC Brief Assessment  Insurance and Status Reviewed  Patient has primary care physician Yes  Home environment has been reviewed Apartment  Prior level of function: Independent  Prior/Current Home Services No current home services  Social Drivers of Health Review SDOH reviewed no interventions necessary  Readmission risk has been reviewed Yes  Transition of care needs no transition of care needs at this time

## 2023-05-22 DIAGNOSIS — R002 Palpitations: Secondary | ICD-10-CM | POA: Diagnosis not present

## 2023-05-26 DIAGNOSIS — K760 Fatty (change of) liver, not elsewhere classified: Secondary | ICD-10-CM | POA: Diagnosis not present

## 2023-05-26 DIAGNOSIS — N1831 Chronic kidney disease, stage 3a: Secondary | ICD-10-CM | POA: Diagnosis not present

## 2023-05-26 DIAGNOSIS — Z79899 Other long term (current) drug therapy: Secondary | ICD-10-CM | POA: Diagnosis not present

## 2023-05-26 DIAGNOSIS — K5790 Diverticulosis of intestine, part unspecified, without perforation or abscess without bleeding: Secondary | ICD-10-CM | POA: Diagnosis not present

## 2023-05-26 DIAGNOSIS — Z86711 Personal history of pulmonary embolism: Secondary | ICD-10-CM | POA: Diagnosis not present

## 2023-05-26 DIAGNOSIS — I2510 Atherosclerotic heart disease of native coronary artery without angina pectoris: Secondary | ICD-10-CM | POA: Diagnosis not present

## 2023-05-26 DIAGNOSIS — I10 Essential (primary) hypertension: Secondary | ICD-10-CM | POA: Diagnosis not present

## 2023-05-26 DIAGNOSIS — R7303 Prediabetes: Secondary | ICD-10-CM | POA: Diagnosis not present

## 2023-06-12 ENCOUNTER — Ambulatory Visit (HOSPITAL_BASED_OUTPATIENT_CLINIC_OR_DEPARTMENT_OTHER): Payer: Medicare Other | Admitting: Internal Medicine

## 2023-06-12 ENCOUNTER — Encounter (HOSPITAL_BASED_OUTPATIENT_CLINIC_OR_DEPARTMENT_OTHER): Payer: Self-pay | Admitting: Internal Medicine

## 2023-06-12 VITALS — BP 140/78 | HR 96 | Ht 65.0 in | Wt 264.0 lb

## 2023-06-12 DIAGNOSIS — E785 Hyperlipidemia, unspecified: Secondary | ICD-10-CM

## 2023-06-12 DIAGNOSIS — I10 Essential (primary) hypertension: Secondary | ICD-10-CM | POA: Diagnosis not present

## 2023-06-12 DIAGNOSIS — Z86711 Personal history of pulmonary embolism: Secondary | ICD-10-CM | POA: Diagnosis not present

## 2023-06-12 DIAGNOSIS — Z7901 Long term (current) use of anticoagulants: Secondary | ICD-10-CM | POA: Diagnosis not present

## 2023-06-12 NOTE — Progress Notes (Signed)
 NEW PATIENT NOTE  Chief Complaint:  Follow-up  Primary Care Physician: Benedetta Bradley, MD  Primary Cardiologist:  Hazle Lites, MD  HPI:  [name] is a 78 y.o. female who is being seen today for the evaluation of chest pain at the request of Benedetta Bradley, *. this is a pleasant 78 year old female recently seen in the emergency department for chest pain.  She has a history of chest pain in the past and has a known left bundle branch block.  She was previously seeing Dr. Anastasia Balo and underwent work-up for this including a stress test in 2016 which was negative for ischemia.  In 2019 she was hospitalized and had chest pain and seen by Dr. Alvis Ba.  Echo showed normal systolic function.  She had a coronary artery CT scan which showed low coronary calcium  score of 6, 45th percentile for age and sex matched control.  Normal coronary origins.  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  This is quite reassuring.  She had not been seen again since 2019 and is considered a new patient.  Her current episode of chest pain occurred at rest.  She said it was a burning pain in the center of her chest.  It took several hours to resolve spontaneously.  It has not recurred since hospitalization.  EKG was personally reviewed demonstrating sinus rhythm with left bundle branch block and no ischemic changes.  Troponin was negative x2.  She was discharged with cardiology follow-up.  She was told apparently by the EMS personnel that she had dextrocardia.  Its not clear to me where this came from.  Perhaps they were saying that they performed a right sided EKG which apparently they did however I personally reviewed her imaging and she has a levo-rotated heart which is normal.  06/12/2023  [name] returns today for follow-up.  She has intermittently been seen by Neomi Banks, NP.  After I saw her initially she had some issues with further chest discomfort.   Ultimately she was hospitalized and found to have pulmonary emboli.  She is subsequently been anticoagulated on Eliquis .  She also was hospitalized again for diverticulitis recently in April.  She was started on a statin intermittently.  Her LDL in January was down into the 50s.  Overall she seems to be feeling much better.  She recently saw her PCP and is planning to start Ozempic.  PMHx:  Past Medical History:  Diagnosis Date   Anxiety    Arthritis    Back pain    Chronic pain    Edema of both lower extremities    Fluid retention    Food allergy    GERD (gastroesophageal reflux disease)    Hyperlipidemia    Hypertension    Knee pain    LBBB (left bundle branch block)    Osteoarthritis    Pre-diabetes    Shoulder pain    Sleep apnea    possibly   Spinal stenosis    Vitamin D  deficiency     Past Surgical History:  Procedure Laterality Date   ABDOMINAL HYSTERECTOMY  1970's   ABDOMINAL HYSTERECTOMY     APPENDECTOMY  1970's   with hysterectomy   APPENDECTOMY     with hysterectomy   COLONOSCOPY     COLONOSCOPY WITH PROPOFOL  N/A 10/04/2013   Procedure: COLONOSCOPY WITH PROPOFOL ;  Surgeon: Garrett Kallman, MD;  Location: [facility];  Service: Endoscopy;  Laterality: N/A;   OOPHORECTOMY  TOTAL HIP ARTHROPLASTY  04/04/2011   Procedure: TOTAL HIP ARTHROPLASTY ANTERIOR APPROACH;  Surgeon: Arnie Lao, MD;  Location: [facility];  Service: Orthopedics;  Laterality: Right;   TOTAL HIP ARTHROPLASTY      FAMHx:  Family History  Problem Relation Age of Onset   Hyperlipidemia Mother    Hypertension Mother    Heart disease Mother    Diabetes Mother    Obesity Mother    Kidney disease Mother    Stroke Father    Hyperlipidemia Father    Hypertension Father    Heart disease Father    Heart attack Father    High Cholesterol Father    Depression Father    Anxiety disorder Father    Alcoholism Father    Prostate cancer Brother    Heart attack Brother    Heart disease  Brother     SOCHx:   reports that she quit smoking about 12 years ago. Her smoking use included cigarettes. She started smoking about 32 years ago. She has a 20 pack-year smoking history. She has never used smokeless tobacco. She reports that she does not drink alcohol and does not use drugs.  ALLERGIES:  Allergies  Allergen Reactions   Kiwi Extract Itching   Meloxicam Other (See Comments)    Hypertension   Pineapple Itching    ROS: Pertinent items noted in HPI and remainder of comprehensive ROS otherwise negative.  HOME MEDS: Current Outpatient Medications on File Prior to Visit  Medication Sig Dispense Refill   acetaminophen  (TYLENOL ) 650 MG CR tablet Take 650 mg by mouth every 8 (eight) hours as needed for pain.     amLODipine  (NORVASC ) 5 MG tablet Take 5 mg by mouth 2 (two) times daily.     Ascorbic Acid (VITAMIN C) 1000 MG tablet Take 1,000 mg by mouth daily. (Patient not taking: Reported on 05/15/2023)     carvedilol  (COREG ) 12.5 MG tablet Take 1 tablet (12.5 mg total) by mouth 2 (two) times daily with a meal. 60 tablet 11   cholecalciferol  (VITAMIN D ) 25 MCG (1000 UNIT) tablet Take 1,000 Units by mouth daily.     ELIQUIS  5 MG TABS tablet Take 5 mg by mouth 2 (two) times daily.     HYDROcodone -acetaminophen  (NORCO/VICODIN) 5-325 MG tablet Take 1 tablet by mouth every 6 (six) hours as needed. 10 tablet 0   Multiple Vitamins-Minerals (CENTRUM SILVER PO) Take by mouth. (Patient not taking: Reported on 05/15/2023)     pantoprazole  (PROTONIX ) 20 MG tablet Take 1 tablet (20 mg total) by mouth daily. 30 tablet 0   polyethylene glycol powder (GLYCOLAX /MIRALAX ) 17 GM/SCOOP powder Take 17 g by mouth daily. 238 g 0   rosuvastatin  (CRESTOR ) 20 MG tablet Take 1 tablet (20 mg total) by mouth daily. 90 tablet 3   Vitamin D , Ergocalciferol , (DRISDOL ) 1.25 MG (50000 UNIT) CAPS capsule Take 1 capsule (50,000 Units total) by mouth every 7 (seven) days. (Patient not taking: Reported on 01/09/2023) 4  capsule 0   No current facility-administered medications on file prior to visit.    LABS/IMAGING: No results found for this or any previous visit (from the past 48 hours). No results found.  LIPID PANEL:    Component Value Date/Time   CHOL 197 12/16/2022 0933   TRIG 74 02/18/2023 0000   HDL 41 02/18/2023 0000   HDL 42 12/16/2022 0933   CHOLHDL 5.1 (H) 10/12/2020 1350   CHOLHDL 4.6 04/20/2017 0929   VLDL 15 04/20/2017 0929  LDLCALC 57 02/18/2023 0000   LDLCALC 139 (H) 12/16/2022 0933    WEIGHTS: Wt Readings from Last 3 Encounters:  06/12/23 264 lb (119.7 kg)  05/15/23 260 lb (117.9 kg)  04/20/23 260 lb (117.9 kg)    VITALS: BP (!) 140/78   Pulse 96   Ht 5\' 5"  (1.651 m)   Wt 264 lb (119.7 kg)   SpO2 93%   BMI 43.93 kg/m   EXAM: General appearance: alert, no distress, and morbidly obese Neck: no carotid bruit, no JVD, and thyroid  not enlarged, symmetric, no tenderness/mass/nodules Lungs: clear to auscultation bilaterally Heart: regular rate and rhythm, S1, S2 normal, no murmur, click, rub or gallop Abdomen: soft, non-tender; bowel sounds normal; no masses,  no organomegaly Extremities: extremities normal, atraumatic, no cyanosis or edema Pulses: 2+ and symmetric Skin: Skin color, texture, turgor normal. No rashes or lesions Neurologic: Grossly normal Psych: Pleasant  EKG: Deferred  ASSESSMENT: Atypical chest pain History of pulmonary emboli-on Eliquis  Family history of premature coronary disease CAD with minimal disease of the proximal LAD, CAC score of 6 (2019) by CT coronary angiogram Hypertension Dyslipidemia  PLAN: 1.   [name] unfortunately developed pulmonary emboli.  She is not anticoagulated on Eliquis .  She also had some diverticulitis.  She is on rosuvastatin  and her LDL is now in the 50s.  I encouraged her to continue with that.  Blood pressure was a little elevated today but is better controlled around the 120 systolic at home.  No changes  to her medicines.  Plan follow-up us  annually or sooner as necessary.  Hazle Lites, MD, [facility], FNLA, FACP  [place]  [facility]  Medical Director of the Advanced Lipid Disorders &  Cardiovascular Risk Reduction Clinic Diplomate of the American Board of Clinical Lipidology Attending Cardiologist  Direct Dial: [phone]  Fax: [phone]  Website:  [email or web address]   Aviva Lemmings Briell Paulette 06/12/2023, 1:23 PM

## 2023-06-12 NOTE — Patient Instructions (Signed)
 Medication Instructions:  Your physician recommends that you continue on your current medications as directed. Please refer to the Current Medication list given to you today.  *If you need a refill on your cardiac medications before your next appointment, please call your pharmacy*  Follow-Up: At [facility], you and your health needs are our priority.  As part of our continuing mission to provide you with exceptional heart care, our providers are all part of one team.  This team includes your primary Cardiologist (physician) and Advanced Practice Providers or APPs (Physician Assistants and Nurse Practitioners) who all work together to provide you with the care you need, when you need it.  Your next appointment:   12 month(s)  Provider:   Neomi Banks, NP

## 2023-07-08 ENCOUNTER — Telehealth: Payer: Self-pay

## 2023-07-08 NOTE — Telephone Encounter (Signed)
 Verbally confirmed appointment for 5/29

## 2023-07-09 ENCOUNTER — Inpatient Hospital Stay: Payer: Medicare Other | Attending: Hematology and Oncology | Admitting: Hematology and Oncology

## 2023-07-09 VITALS — BP 138/62 | HR 87 | Temp 98.3°F | Resp 18 | Ht 65.0 in | Wt 265.9 lb

## 2023-07-09 DIAGNOSIS — I2699 Other pulmonary embolism without acute cor pulmonale: Secondary | ICD-10-CM | POA: Diagnosis not present

## 2023-07-09 DIAGNOSIS — Z7901 Long term (current) use of anticoagulants: Secondary | ICD-10-CM | POA: Insufficient documentation

## 2023-07-09 DIAGNOSIS — Z87891 Personal history of nicotine dependence: Secondary | ICD-10-CM | POA: Diagnosis not present

## 2023-07-09 DIAGNOSIS — K219 Gastro-esophageal reflux disease without esophagitis: Secondary | ICD-10-CM | POA: Diagnosis not present

## 2023-07-09 MED ORDER — PANTOPRAZOLE SODIUM 20 MG PO TBEC: 20.0000 mg | DELAYED_RELEASE_TABLET | Freq: Every day | ORAL | 2 refills | Status: AC

## 2023-07-09 NOTE — Progress Notes (Signed)
 [facility] CONSULT NOTE  Patient Care Team: Benedetta Bradley, MD as PCP - General (Internal Medicine) Maximo Spar Aviva Lemmings, MD as PCP - Cardiology (Cardiology) Clearnce Curia, NP as Nurse Practitioner (Cardiology)  CHIEF COMPLAINTS/PURPOSE OF CONSULTATION:  Pulmonary embolism  ASSESSMENT & PLAN:   Pulmonary embolism without acute cor pulmonale [facility]) This is a very pleasant 51 female with newly diagnosed acute bilateral PE referred to hematology for recommendations. Pertinent hypercoag work up for acquired hypercoag disorders such as APLS/MPN/PNH neg. Given age of 64 at first episode of blood clot and no sig FH, we didn't pursue rest of the hypercoag work up She has now completed 6 months of anticoagulation. Her pulmonary symptoms have completely resolved. She has BLE edema 2 +, symmetric and no new issues. At this time, we discussed about discontinuing eliquis  and taking aspirin  81 mg vs continuing indefinite eliquis . Discussed about symptoms/signs of VTE, risk factors She is leaning towards stopping eliquis  and taking aspirin . She know not to use HRT.    General Health Maintenance -Continue age-appropriate screenings. 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 -IRTC with us  as needed  HISTORY OF PRESENTING ILLNESS:  [name] 78 y.o. female is here because of pulmonary embolism, acute.  Discussed the use of AI scribe software for clinical note transcription with the patient, who gave verbal consent to proceed.  History of Present Illness    The patient is a 78 year old female with a history of blood clots who presents with a recent pulmonary embolism.  In November, she experienced her first blood clot, a bilateral pulmonary embolism, preceded by leg pain and swelling. She developed shortness of breath and chest pain, prompting her to call paramedics on Thanksgiving Day. She was initially taken to [facility] and then  transferred to [facility] due to severe symptoms. The leg pain was only present on the day of the clot. There was no recent travel, injury, or new medications that could have contributed to the clot.  She has a family history of blood clots, with her son and possibly her father having experienced them. Her son was diagnosed with blood clots in his early forties and is on lifelong anticoagulation therapy. She also had COVID-19 about Nov 2024, confirmed by testing.  Discussed the use of AI scribe software for clinical note transcription with the patient, who gave verbal consent to proceed.  History of Present Illness [name] is a 78 year old female who presents for follow-up regarding anticoagulation therapy.  She has a history of blood clots and has been on Eliquis  for six months. Pertinent blood work to investigate the cause of the clot was negative. She had a COVID illness prior to the clot, which may have been a contributing factor. She is currently completing her course of Eliquis .  She has experienced chest pain and shortness of breath in the past, which have improved. No new chest pain or shortness of breath. No unilateral leg swelling.  She was recently admitted for enterocolitis and was treated for the same, feeling much better.  She has a history of acid reflux and is currently taking medication for it. She plans to follow up with her gastroenterologist in July.   She is up to date with her cancer screenings, including mammograms and colonoscopies. She does not take vitamin D  supplements anymore.  Rest of the pertinent 10 point ROS reviewed and neg.  MEDICAL HISTORY:  Past Medical History:  Diagnosis Date  Anxiety    Arthritis    Back pain    Chronic pain    Edema of both lower extremities    Fluid retention    Food allergy    GERD (gastroesophageal reflux disease)    Hyperlipidemia    Hypertension    Knee pain    LBBB (left bundle branch block)    Osteoarthritis     Pre-diabetes    Shoulder pain    Sleep apnea    possibly   Spinal stenosis    Vitamin D  deficiency     SURGICAL HISTORY: Past Surgical History:  Procedure Laterality Date   ABDOMINAL HYSTERECTOMY  1970's   ABDOMINAL HYSTERECTOMY     APPENDECTOMY  1970's   with hysterectomy   APPENDECTOMY     with hysterectomy   COLONOSCOPY     COLONOSCOPY WITH PROPOFOL  N/A 10/04/2013   Procedure: COLONOSCOPY WITH PROPOFOL ;  Surgeon: Garrett Kallman, MD;  Location: [facility];  Service: Endoscopy;  Laterality: N/A;   OOPHORECTOMY     TOTAL HIP ARTHROPLASTY  04/04/2011   Procedure: TOTAL HIP ARTHROPLASTY ANTERIOR APPROACH;  Surgeon: Arnie Lao, MD;  Location: [facility];  Service: Orthopedics;  Laterality: Right;   TOTAL HIP ARTHROPLASTY      SOCIAL HISTORY: Social History   Socioeconomic History   Marital status: Widowed    Spouse name: Not on file   Number of children: Not on file   Years of education: Not on file   Highest education level: Not on file  Occupational History   Occupation: Dialysis Tech, retired  Tobacco Use   Smoking status: Former    Current packs/day: 0.00    Average packs/day: 1 pack/day for 20.0 years (20.0 ttl pk-yrs)    Types: Cigarettes    Start date: 05/23/1991    Quit date: 05/23/2011    Years since quitting: 12.1   Smokeless tobacco: Never  Substance and Sexual Activity   Alcohol use: No    Comment: occassional   Drug use: No   Sexual activity: Not on file  Other Topics Concern   Not on file  Social History Narrative   ** Merged History Encounter **       Social Drivers of Health   Financial Resource Strain: Not on file  Food Insecurity: No Food Insecurity (05/15/2023)   Hunger Vital Sign    Worried About Running Out of Food in the Last Year: Never true    Ran Out of Food in the Last Year: Never true  Transportation Needs: No Transportation Needs (05/15/2023)   PRAPARE - Administrator, Civil Service (Medical): No    Lack of  Transportation (Non-Medical): No  Physical Activity: Not on file  Stress: Not on file  Social Connections: Moderately Isolated (05/15/2023)   Social Connection and Isolation Panel [NHANES]    Frequency of Communication with Friends and Family: More than three times a week    Frequency of Social Gatherings with Friends and Family: Once a week    Attends Religious Services: More than 4 times per year    Active Member of [employer] or Organizations: No    Attends Banker Meetings: Never    Marital Status: Widowed  Intimate Partner Violence: Not At Risk (05/15/2023)   Humiliation, Afraid, Rape, and Kick questionnaire    Fear of Current or Ex-Partner: No    Emotionally Abused: No    Physically Abused: No    Sexually Abused: No    FAMILY  HISTORY: Family History  Problem Relation Age of Onset   Hyperlipidemia Mother    Hypertension Mother    Heart disease Mother    Diabetes Mother    Obesity Mother    Kidney disease Mother    Stroke Father    Hyperlipidemia Father    Hypertension Father    Heart disease Father    Heart attack Father    High Cholesterol Father    Depression Father    Anxiety disorder Father    Alcoholism Father    Prostate cancer Brother    Heart attack Brother    Heart disease Brother     ALLERGIES:  is allergic to kiwi extract, meloxicam, and pineapple.  MEDICATIONS:  Current Outpatient Medications  Medication Sig Dispense Refill   acetaminophen  (TYLENOL ) 650 MG CR tablet Take 650 mg by mouth every 8 (eight) hours as needed for pain.     amLODipine  (NORVASC ) 5 MG tablet Take 5 mg by mouth 2 (two) times daily.     Ascorbic Acid (VITAMIN C) 1000 MG tablet Take 1,000 mg by mouth daily. (Patient not taking: Reported on 05/15/2023)     carvedilol  (COREG ) 12.5 MG tablet Take 1 tablet (12.5 mg total) by mouth 2 (two) times daily with a meal. 60 tablet 11   cholecalciferol  (VITAMIN D ) 25 MCG (1000 UNIT) tablet Take 1,000 Units by mouth daily.     ELIQUIS  5 MG  TABS tablet Take 5 mg by mouth 2 (two) times daily.     HYDROcodone -acetaminophen  (NORCO/VICODIN) 5-325 MG tablet Take 1 tablet by mouth every 6 (six) hours as needed. 10 tablet 0   Multiple Vitamins-Minerals (CENTRUM SILVER PO) Take by mouth. (Patient not taking: Reported on 05/15/2023)     pantoprazole  (PROTONIX ) 20 MG tablet Take 1 tablet (20 mg total) by mouth daily. 30 tablet 0   polyethylene glycol powder (GLYCOLAX /MIRALAX ) 17 GM/SCOOP powder Take 17 g by mouth daily. 238 g 0   rosuvastatin  (CRESTOR ) 20 MG tablet Take 1 tablet (20 mg total) by mouth daily. 90 tablet 3   Vitamin D , Ergocalciferol , (DRISDOL ) 1.25 MG (50000 UNIT) CAPS capsule Take 1 capsule (50,000 Units total) by mouth every 7 (seven) days. (Patient not taking: Reported on 01/09/2023) 4 capsule 0   No current facility-administered medications for this visit.     PHYSICAL EXAMINATION: ECOG PERFORMANCE STATUS: 0 - Asymptomatic  Vitals:   07/09/23 1409 07/09/23 1410  BP: (!) 180/63 138/62  Pulse: 87   Resp: 18   Temp: 98.3 F (36.8 C)   SpO2: 93%    Filed Weights   07/09/23 1409  Weight: 265 lb 14.4 oz (120.6 kg)    GENERAL:alert, no distress and comfortable SKIN: skin color, texture, turgor are normal, no rashes or significant lesions EYES: normal, conjunctiva are pink and non-injected, sclera clear OROPHARYNX:no exudate, no erythema and lips, buccal mucosa, and tongue normal  NECK: supple, thyroid  normal size, non-tender, without nodularity LYMPH:  no palpable lymphadenopathy in the cervical, axillary LUNGS: clear to auscultation and percussion with normal breathing effort HEART: regular rate & rhythm and no murmurs  Extremity: BLE noted, symmetrical. ABDOMEN:abdomen soft, non-tender and normal bowel sounds Musculoskeletal:no cyanosis of digits and no clubbing  PSYCH: alert & oriented x 3 with fluent speech NEURO: no focal motor/sensory deficits  LABORATORY DATA:  I have reviewed the data as listed Lab  Results  Component Value Date   WBC 4.9 05/16/2023   HGB 14.1 05/16/2023   HCT 43.5 05/16/2023  MCV 98.6 05/16/2023   PLT 344 05/16/2023     Chemistry      Component Value Date/Time   NA 139 05/16/2023 0602   NA 141 02/18/2023 0000   K 3.9 05/16/2023 0602   CL 105 05/16/2023 0602   CO2 25 05/16/2023 0602   BUN 7 (L) 05/16/2023 0602   BUN 12 02/18/2023 0000   CREATININE 0.85 05/16/2023 0602   GLU 106 02/18/2023 0000      Component Value Date/Time   CALCIUM  9.5 05/16/2023 0602   ALKPHOS 54 05/16/2023 0602   AST 19 05/16/2023 0602   ALT 18 05/16/2023 0602   BILITOT 0.7 05/16/2023 0602   BILITOT 0.4 12/16/2022 0933       RADIOGRAPHIC STUDIES: I have personally reviewed the radiological images as listed and agreed with the findings in the report. No results found.  All questions were answered. The patient knows to call the clinic with any problems, questions or concerns. I spent 30 minutes in the care of this patient including H and P, review of records, counseling and coordination of care.     Murleen Arms, MD 07/09/2023 2:22 PM

## 2023-08-10 DIAGNOSIS — I2510 Atherosclerotic heart disease of native coronary artery without angina pectoris: Secondary | ICD-10-CM | POA: Diagnosis not present

## 2023-08-10 DIAGNOSIS — L089 Local infection of the skin and subcutaneous tissue, unspecified: Secondary | ICD-10-CM | POA: Diagnosis not present

## 2023-08-10 DIAGNOSIS — N1831 Chronic kidney disease, stage 3a: Secondary | ICD-10-CM | POA: Diagnosis not present

## 2023-08-10 DIAGNOSIS — I10 Essential (primary) hypertension: Secondary | ICD-10-CM | POA: Diagnosis not present

## 2023-08-12 DIAGNOSIS — I2510 Atherosclerotic heart disease of native coronary artery without angina pectoris: Secondary | ICD-10-CM | POA: Diagnosis not present

## 2023-08-12 DIAGNOSIS — Z86711 Personal history of pulmonary embolism: Secondary | ICD-10-CM | POA: Diagnosis not present

## 2023-08-12 DIAGNOSIS — N1831 Chronic kidney disease, stage 3a: Secondary | ICD-10-CM | POA: Diagnosis not present

## 2023-08-12 DIAGNOSIS — I10 Essential (primary) hypertension: Secondary | ICD-10-CM | POA: Diagnosis not present

## 2023-08-12 DIAGNOSIS — K760 Fatty (change of) liver, not elsewhere classified: Secondary | ICD-10-CM | POA: Diagnosis not present

## 2023-08-12 DIAGNOSIS — K5790 Diverticulosis of intestine, part unspecified, without perforation or abscess without bleeding: Secondary | ICD-10-CM | POA: Diagnosis not present

## 2023-08-12 DIAGNOSIS — R7303 Prediabetes: Secondary | ICD-10-CM | POA: Diagnosis not present

## 2023-08-12 DIAGNOSIS — L089 Local infection of the skin and subcutaneous tissue, unspecified: Secondary | ICD-10-CM | POA: Diagnosis not present

## 2023-09-10 DIAGNOSIS — I2510 Atherosclerotic heart disease of native coronary artery without angina pectoris: Secondary | ICD-10-CM | POA: Diagnosis not present

## 2023-09-10 DIAGNOSIS — I10 Essential (primary) hypertension: Secondary | ICD-10-CM | POA: Diagnosis not present

## 2023-09-10 DIAGNOSIS — N1831 Chronic kidney disease, stage 3a: Secondary | ICD-10-CM | POA: Diagnosis not present

## 2023-09-12 ENCOUNTER — Encounter (HOSPITAL_COMMUNITY): Payer: Self-pay

## 2023-09-12 ENCOUNTER — Emergency Department (HOSPITAL_COMMUNITY)

## 2023-09-12 ENCOUNTER — Emergency Department (HOSPITAL_COMMUNITY)
Admission: EM | Admit: 2023-09-12 | Discharge: 2023-09-13 | Disposition: A | Discharge status: Home / Self Care | Attending: Emergency Medicine | Admitting: Emergency Medicine

## 2023-09-12 DIAGNOSIS — E876 Hypokalemia: Secondary | ICD-10-CM | POA: Diagnosis not present

## 2023-09-12 DIAGNOSIS — R600 Localized edema: Secondary | ICD-10-CM | POA: Insufficient documentation

## 2023-09-12 DIAGNOSIS — I10 Essential (primary) hypertension: Secondary | ICD-10-CM | POA: Diagnosis not present

## 2023-09-12 DIAGNOSIS — R404 Transient alteration of awareness: Secondary | ICD-10-CM | POA: Diagnosis not present

## 2023-09-12 DIAGNOSIS — I447 Left bundle-branch block, unspecified: Secondary | ICD-10-CM | POA: Diagnosis not present

## 2023-09-12 DIAGNOSIS — R0602 Shortness of breath: Secondary | ICD-10-CM | POA: Diagnosis not present

## 2023-09-12 DIAGNOSIS — Z79899 Other long term (current) drug therapy: Secondary | ICD-10-CM | POA: Diagnosis not present

## 2023-09-12 DIAGNOSIS — I159 Secondary hypertension, unspecified: Secondary | ICD-10-CM

## 2023-09-12 DIAGNOSIS — Z7901 Long term (current) use of anticoagulants: Secondary | ICD-10-CM | POA: Insufficient documentation

## 2023-09-12 DIAGNOSIS — R8289 Other abnormal findings on cytological and histological examination of urine: Secondary | ICD-10-CM | POA: Insufficient documentation

## 2023-09-12 DIAGNOSIS — R197 Diarrhea, unspecified: Secondary | ICD-10-CM | POA: Diagnosis not present

## 2023-09-12 DIAGNOSIS — R42 Dizziness and giddiness: Secondary | ICD-10-CM | POA: Diagnosis not present

## 2023-09-12 LAB — CBG MONITORING, ED: Glucose-Capillary: 97 mg/dL (ref 70–99)

## 2023-09-12 NOTE — ED Provider Notes (Incomplete)
 [place] EMERGENCY DEPARTMENT AT [facility] Provider Note   CSN: [id number] Arrival date & time: 09/12/23  2156     Patient presents with: Hypertension   [name] is a 78 y.o. female with history of  {Add pertinent medical, surgical, social history, OB history to HPI:32947} HPI     Prior to Admission medications   Medication Sig Start Date End Date Taking? Authorizing Provider  acetaminophen  (TYLENOL ) 650 MG CR tablet Take 650 mg by mouth every 8 (eight) hours as needed for pain.    [provider]  amLODipine  (NORVASC ) 5 MG tablet Take 5 mg by mouth 2 (two) times daily.    [provider]  Ascorbic Acid (VITAMIN C) 1000 MG tablet Take 1,000 mg by mouth daily. Patient not taking: Reported on 05/15/2023    [provider]  carvedilol  (COREG ) 12.5 MG tablet Take 1 tablet (12.5 mg total) by mouth 2 (two) times daily with a meal. 06/18/14   Barrett, Shona MATSU, PA-C  cholecalciferol  (VITAMIN D ) 25 MCG (1000 UNIT) tablet Take 1,000 Units by mouth daily.    [provider]  ELIQUIS  5 MG TABS tablet Take 5 mg by mouth 2 (two) times daily.    [provider]  HYDROcodone -acetaminophen  (NORCO/VICODIN) 5-325 MG tablet Take 1 tablet by mouth every 6 (six) hours as needed. 04/20/23   Doretha Folks, MD  Multiple Vitamins-Minerals (CENTRUM SILVER PO) Take by mouth. Patient not taking: Reported on 05/15/2023    [provider]  pantoprazole  (PROTONIX ) 20 MG tablet Take 1 tablet (20 mg total) by mouth daily. 07/09/23   Iruku, Praveena, MD  polyethylene glycol powder (GLYCOLAX /MIRALAX ) 17 GM/SCOOP powder Take 17 g by mouth daily. 05/18/23   Jillian Buttery, MD  rosuvastatin  (CRESTOR ) 20 MG tablet Take 1 tablet (20 mg total) by mouth daily. 11/11/22   Vannie Reche RAMAN, NP  Vitamin D , Ergocalciferol , (DRISDOL ) 1.25 MG (50000 UNIT) CAPS capsule Take 1 capsule (50,000 Units total) by mouth every 7 (seven) days. Patient not taking: Reported  on 01/09/2023 01/06/23   [place] Adelita PENNER, MD    Allergies: Kiwi extract, Meloxicam, and Pineapple    Review of Systems  Updated Vital Signs BP (!) 176/86 (BP Location: Right Arm)   Pulse 70   Temp 98.3 F (36.8 C) (Oral)   Resp 12   Ht 5' 5 (1.651 m)   Wt 115.2 kg   SpO2 100%   BMI 42.27 kg/m   Physical Exam Cardiovascular:     Rate and Rhythm: Normal rate and regular rhythm.     Pulses:          Radial pulses are 1+ on the right side and 1+ on the left side.       Dorsalis pedis pulses are 1+ on the right side and 1+ on the left side.     Heart sounds: No murmur heard.    Gallop present.  Musculoskeletal:     Right lower leg: 2+ Edema present.     Left lower leg: 2+ Edema present.     (all labs ordered are listed, but only abnormal results are displayed) Labs Reviewed  CBG MONITORING, ED    EKG: None  Radiology: No results found.  {Document cardiac monitor, telemetry assessment procedure when appropriate:32947} Procedures   Medications Ordered in the ED - No data to display    {Click here for ABCD2, HEART and other calculators REFRESH Note before signing:1}  Medical Decision Making Amount and/or Complexity of Data Reviewed Labs: ordered. Radiology: ordered.   ***  {Document critical care time when appropriate  Document review of labs and clinical decision tools ie CHADS2VASC2, etc  Document your independent review of radiology images and any outside records  Document your discussion with family members, caretakers and with consultants  Document social determinants of health affecting pt's care  Document your decision making why or why not admission, treatments were needed:32947:::1}   Final diagnoses:  None    ED Discharge Orders     None

## 2023-09-12 NOTE — ED Provider Notes (Signed)
 [place] [facility] DEPARTMENT AT [facility] Provider Note   CSN: [id number] Arrival date & time: 09/12/23  2156     Patient presents with: Hypertension   [name] is a 78 y.o. female.  {Add pertinent medical, surgical, social history, OB history to HPI:32947} HPI     Prior to Admission medications   Medication Sig Start Date End Date Taking? Authorizing Provider  acetaminophen  (TYLENOL ) 650 MG CR tablet Take 650 mg by mouth every 8 (eight) hours as needed for pain.    [provider]  amLODipine  (NORVASC ) 5 MG tablet Take 5 mg by mouth 2 (two) times daily.    [provider]  Ascorbic Acid (VITAMIN C) 1000 MG tablet Take 1,000 mg by mouth daily. Patient not taking: Reported on 05/15/2023    [provider]  carvedilol  (COREG ) 12.5 MG tablet Take 1 tablet (12.5 mg total) by mouth 2 (two) times daily with a meal. 06/18/14   Barrett, Shona MATSU, PA-C  cholecalciferol  (VITAMIN D ) 25 MCG (1000 UNIT) tablet Take 1,000 Units by mouth daily.    [provider]  ELIQUIS  5 MG TABS tablet Take 5 mg by mouth 2 (two) times daily.    [provider]  HYDROcodone -acetaminophen  (NORCO/VICODIN) 5-325 MG tablet Take 1 tablet by mouth every 6 (six) hours as needed. 04/20/23   Doretha Folks, MD  Multiple Vitamins-Minerals (CENTRUM SILVER PO) Take by mouth. Patient not taking: Reported on 05/15/2023    [provider]  pantoprazole  (PROTONIX ) 20 MG tablet Take 1 tablet (20 mg total) by mouth daily. 07/09/23   Iruku, Praveena, MD  polyethylene glycol powder (GLYCOLAX /MIRALAX ) 17 GM/SCOOP powder Take 17 g by mouth daily. 05/18/23   Jillian Buttery, MD  rosuvastatin  (CRESTOR ) 20 MG tablet Take 1 tablet (20 mg total) by mouth daily. 11/11/22   Vannie Reche RAMAN, NP  Vitamin D , Ergocalciferol , (DRISDOL ) 1.25 MG (50000 UNIT) CAPS capsule Take 1 capsule (50,000 Units total) by mouth every 7 (seven) days. Patient not taking: Reported on 01/09/2023  01/06/23   [place] Adelita PENNER, MD    Allergies: Kiwi extract, Meloxicam, and Pineapple    Review of Systems  Updated Vital Signs BP (!) 176/86 (BP Location: Right Arm)   Pulse 70   Temp 98.3 F (36.8 C) (Oral)   Resp 12   Ht 5' 5 (1.651 m)   Wt 115.2 kg   SpO2 100%   BMI 42.27 kg/m   Physical Exam  (all labs ordered are listed, but only abnormal results are displayed) Labs Reviewed  CBG MONITORING, ED    EKG: None  Radiology: No results found.  {Document cardiac monitor, telemetry assessment procedure when appropriate:32947} Procedures   Medications Ordered in the ED - No data to display    {Click here for ABCD2, HEART and other calculators REFRESH Note before signing:1}                              Medical Decision Making  ***  {Document critical care time when appropriate  Document review of labs and clinical decision tools ie CHADS2VASC2, etc  Document your independent review of radiology images and any outside records  Document your discussion with family members, caretakers and with consultants  Document social determinants of health affecting pt's care  Document your decision making why or why not admission, treatments were needed:32947:::1}   Final diagnoses:  None    ED Discharge Orders  None

## 2023-09-12 NOTE — ED Triage Notes (Signed)
 Pt BIB by ems for hypertension. Pt reports high BP except when she stands up the BP drop dramatically along with an increase in HR. She states feeling lethargic, dizziness, lightheadedness also.

## 2023-09-13 DIAGNOSIS — Z7401 Bed confinement status: Secondary | ICD-10-CM | POA: Diagnosis not present

## 2023-09-13 DIAGNOSIS — R531 Weakness: Secondary | ICD-10-CM | POA: Diagnosis not present

## 2023-09-13 DIAGNOSIS — Z743 Need for continuous supervision: Secondary | ICD-10-CM | POA: Diagnosis not present

## 2023-09-13 LAB — URINALYSIS, ROUTINE W REFLEX MICROSCOPIC
Bacteria, UA: NONE SEEN
Bilirubin Urine: NEGATIVE
Glucose, UA: NEGATIVE mg/dL
Ketones, ur: NEGATIVE mg/dL
Nitrite: NEGATIVE
Protein, ur: NEGATIVE mg/dL
Specific Gravity, Urine: 1.005 (ref 1.005–1.030)
pH: 7 (ref 5.0–8.0)

## 2023-09-13 LAB — COMPREHENSIVE METABOLIC PANEL WITH GFR
ALT: 17 U/L (ref 0–44)
AST: 20 U/L (ref 15–41)
Albumin: 3.7 g/dL (ref 3.5–5.0)
Alkaline Phosphatase: 62 U/L (ref 38–126)
Anion gap: 9 (ref 5–15)
BUN: 11 mg/dL (ref 8–23)
CO2: 26 mmol/L (ref 22–32)
Calcium: 9.4 mg/dL (ref 8.9–10.3)
Chloride: 103 mmol/L (ref 98–111)
Creatinine, Ser: 0.88 mg/dL (ref 0.44–1.00)
GFR, Estimated: >60 mL/min (ref >60)
Glucose, Bld: 96 mg/dL (ref 70–99)
Potassium: 3.4 mmol/L — ABNORMAL LOW (ref 3.5–5.1)
Sodium: 138 mmol/L (ref 135–145)
Total Bilirubin: 0.7 mg/dL (ref 0.0–1.2)
Total Protein: 7 g/dL (ref 6.5–8.1)

## 2023-09-13 LAB — CBC WITH DIFFERENTIAL/PLATELET
Abs Immature Granulocytes: 0.01 K/uL (ref 0.00–0.07)
Basophils Absolute: 0.1 K/uL (ref 0.0–0.1)
Basophils Relative: 1 %
Eosinophils Absolute: 0.1 K/uL (ref 0.0–0.5)
Eosinophils Relative: 2 %
HCT: 42 % (ref 36.0–46.0)
Hemoglobin: 13.5 g/dL (ref 12.0–15.0)
Immature Granulocytes: 0 %
Lymphocytes Relative: 38 %
Lymphs Abs: 2.3 K/uL (ref 0.7–4.0)
MCH: 30.3 pg (ref 26.0–34.0)
MCHC: 32.1 g/dL (ref 30.0–36.0)
MCV: 94.2 fL (ref 80.0–100.0)
Monocytes Absolute: 0.5 K/uL (ref 0.1–1.0)
Monocytes Relative: 9 %
Neutro Abs: 2.9 K/uL (ref 1.7–7.7)
Neutrophils Relative %: 50 %
Platelets: 330 K/uL (ref 150–400)
RBC: 4.46 MIL/uL (ref 3.87–5.11)
RDW: 13.2 % (ref 11.5–15.5)
WBC: 5.9 K/uL (ref 4.0–10.5)
nRBC: 0 % (ref 0.0–0.2)

## 2023-09-13 LAB — LIPASE, BLOOD: Lipase: 38 U/L (ref 11–51)

## 2023-09-13 LAB — TROPONIN I (HIGH SENSITIVITY)
Troponin I (High Sensitivity): 9 ng/L (ref <18)
Troponin I (High Sensitivity): 9 ng/L (ref <18)

## 2023-09-13 LAB — PROTIME-INR
INR: 1 (ref 0.8–1.2)
Prothrombin Time: 13.9 s (ref 11.4–15.2)

## 2023-09-13 LAB — BRAIN NATRIURETIC PEPTIDE: B Natriuretic Peptide: 15.7 pg/mL (ref 0.0–100.0)

## 2023-09-13 MED ORDER — LACTATED RINGERS IV BOLUS
500.0000 mL | Freq: Once | INTRAVENOUS | Status: AC
  Administered 2023-09-13: 500 mL via INTRAVENOUS

## 2023-09-13 NOTE — ED Notes (Signed)
 [facility] called and setup tranportation

## 2023-09-13 NOTE — Discharge Instructions (Signed)
 You were seen here today for high blood pressure and fatigue.  While the exact cause your fatigue remains unclear there is no intervening emergent problem at this time.  Please follow-up closely with your primary care doctor for reassessment of your blood pressures.  Continue your home medications as previously prescribed.  Take your blood pressure first thing in the morning before your medication, and at bedtime.  Keep a record of these readings to discuss with your primary care doctor your next follow-up.  Return to the ER with any new severe symptoms.

## 2023-09-18 ENCOUNTER — Other Ambulatory Visit (HOSPITAL_BASED_OUTPATIENT_CLINIC_OR_DEPARTMENT_OTHER): Payer: Self-pay | Admitting: Family

## 2023-09-18 DIAGNOSIS — I25118 Atherosclerotic heart disease of native coronary artery with other forms of angina pectoris: Secondary | ICD-10-CM

## 2023-09-18 DIAGNOSIS — E785 Hyperlipidemia, unspecified: Secondary | ICD-10-CM

## 2023-09-25 ENCOUNTER — Other Ambulatory Visit (HOSPITAL_BASED_OUTPATIENT_CLINIC_OR_DEPARTMENT_OTHER): Payer: Self-pay | Admitting: Family

## 2023-09-25 DIAGNOSIS — E785 Hyperlipidemia, unspecified: Secondary | ICD-10-CM

## 2023-09-25 DIAGNOSIS — I25118 Atherosclerotic heart disease of native coronary artery with other forms of angina pectoris: Secondary | ICD-10-CM

## 2023-09-28 ENCOUNTER — Telehealth: Payer: Self-pay | Admitting: Internal Medicine

## 2023-09-28 DIAGNOSIS — R002 Palpitations: Secondary | ICD-10-CM

## 2023-09-28 NOTE — Telephone Encounter (Signed)
 Patient c/o Palpitations:  STAT if patient reporting lightheadedness, shortness of breath, or chest pain  How long have you had palpitations/irregular HR/ Afib? Are you having the symptoms now? Palpitations for about 2 weeks   Are you currently experiencing lightheadedness, SOB or CP? Nothing currently   Do you have a history of afib (atrial fibrillation) or irregular heart rhythm? No   Have you checked your BP or HR? (document readings if available): she states it is usually normal other than her last ED visit   Are you experiencing any other symptoms? Feels SOB and like she is going to pass out during the palpitation episodes. She states it usually happens when she is active    She has only been seen at [facility]

## 2023-09-28 NOTE — Telephone Encounter (Signed)
 Pt is calling into the office to let Dr. Mona and Reche Finder, NP know that over the last 2-3 weeks, she has been experiencing palpitations and pounding heart rates when she exerts. She states episodes are intermittent in nature, and only occur when she exerts.  She also mentioned occasional dizziness and sob with episodes.    Pt states on 8/2 she reported to the ER for complaints and high BP readings. Pt states at that time her BP/HR went as high as 170 systolic and heart rates were in the 90's. Pt states her pressures usually run around 106/116 systolic and rates in the 60s-70s.   Pt states she does monitor her BP/HR readings at home, with last 3 recordings as so:  Fri 8/15-106/70 HR-60, Sat 8/16-116/73 HR-70, and Sun 8/17-112/78 HR-71.  She did not take her BP/HR today.   Pt states she has periodically been weighing herself since starting Ozempic 2 months ago.  She is not weighing herself on a daily basis, and last recorded weight a couple days ago was 254 lbs. She states she has compression stockings but is not wearing them daily.  Pt is currently asymptomatic at this time and resting comfortably at home.    Pt is asking for further advisement from Dr. Mona or Reche Finder, NP for complaints.   Advised the pt to continue tracking/monitoring her BP/HR's daily.  Advised her to start dry weighing herself daily and notify us  if she notices weight gain of 3 lbs in 24 hrs or 5 lbs in a week.  Advised her to start wearing her compression stockings during the day and elevate her lower extremities at rest.  Also advised her to maintain a low sodium diet.    Informed the pt that I will route this message to both Dr. Mona and Reche Finder NP for further review and advisement.  She is aware we will follow-up with her accordingly thereafter.

## 2023-09-29 ENCOUNTER — Encounter: Payer: Self-pay | Admitting: *Deleted

## 2023-09-29 ENCOUNTER — Ambulatory Visit: Attending: Family

## 2023-09-29 DIAGNOSIS — R002 Palpitations: Secondary | ICD-10-CM

## 2023-09-29 NOTE — Progress Notes (Unsigned)
 Enrolled patient for a 14 day ZIo XT monitor to be mailed to patients home  Hilty to read

## 2023-09-29 NOTE — Telephone Encounter (Signed)
 Returned a call back to the pt and endorsed to her recommendations per Reche Finder NP and Dr. Mona.   Pt aware we will order for her to get a 14 day zio monitor for palpitations.  Pt is aware we will mail this to her confirmed mailing address on file.   Scheduled the pt a follow-up appt with Reche Finder NP in 6 weeks on 11/10/23 at 2:20 pm [facility] location.  Pt aware to arrive 15 mins prior to this appt.   Pt verbalized understanding and agrees with this plan.   Will send a staff message to our monitor dept to enroll and mail zio device to the pt. Will also send zio instructions to her mychart account for reference.

## 2023-09-29 NOTE — Telephone Encounter (Signed)
 Pts zio enrolled and mailing process has begun.

## 2023-09-29 NOTE — Telephone Encounter (Signed)
 Heart rate and BP readings look good. Recommend 2 week ZIO monitor and follow up in clinic in 6 weeks with Dr. Mona or APP.  Jeneane Pieczynski S Jacalynn Buzzell, NP

## 2023-10-11 DIAGNOSIS — I10 Essential (primary) hypertension: Secondary | ICD-10-CM | POA: Diagnosis not present

## 2023-10-11 DIAGNOSIS — N1831 Chronic kidney disease, stage 3a: Secondary | ICD-10-CM | POA: Diagnosis not present

## 2023-10-11 DIAGNOSIS — I2510 Atherosclerotic heart disease of native coronary artery without angina pectoris: Secondary | ICD-10-CM | POA: Diagnosis not present

## 2023-10-20 DIAGNOSIS — R42 Dizziness and giddiness: Secondary | ICD-10-CM | POA: Diagnosis not present

## 2023-10-27 ENCOUNTER — Ambulatory Visit (HOSPITAL_BASED_OUTPATIENT_CLINIC_OR_DEPARTMENT_OTHER): Payer: Self-pay | Admitting: Family

## 2023-10-27 DIAGNOSIS — R002 Palpitations: Secondary | ICD-10-CM | POA: Diagnosis not present

## 2023-10-29 NOTE — Telephone Encounter (Signed)
 Patient is returning call.

## 2023-11-10 ENCOUNTER — Ambulatory Visit (HOSPITAL_BASED_OUTPATIENT_CLINIC_OR_DEPARTMENT_OTHER): Admitting: Family

## 2023-11-10 DIAGNOSIS — N1831 Chronic kidney disease, stage 3a: Secondary | ICD-10-CM | POA: Diagnosis not present

## 2023-11-10 DIAGNOSIS — I10 Essential (primary) hypertension: Secondary | ICD-10-CM | POA: Diagnosis not present

## 2023-11-10 DIAGNOSIS — I2510 Atherosclerotic heart disease of native coronary artery without angina pectoris: Secondary | ICD-10-CM | POA: Diagnosis not present

## 2023-11-23 DIAGNOSIS — Z86711 Personal history of pulmonary embolism: Secondary | ICD-10-CM | POA: Diagnosis not present

## 2023-11-23 DIAGNOSIS — K5900 Constipation, unspecified: Secondary | ICD-10-CM | POA: Diagnosis not present

## 2023-11-23 DIAGNOSIS — Z860101 Personal history of adenomatous and serrated colon polyps: Secondary | ICD-10-CM | POA: Diagnosis not present
